# Patient Record
Sex: Female | Born: 1985 | Race: White | Hispanic: No | Marital: Married | Smoking: Never smoker
Health system: Southern US, Community
[De-identification: ages and names within clinical notes are randomized; demographics above are authoritative.]

## PROBLEM LIST (undated history)

## (undated) ENCOUNTER — Inpatient Hospital Stay (HOSPITAL_COMMUNITY): Payer: Self-pay

## (undated) DIAGNOSIS — D649 Anemia, unspecified: Secondary | ICD-10-CM

## (undated) DIAGNOSIS — L929 Granulomatous disorder of the skin and subcutaneous tissue, unspecified: Secondary | ICD-10-CM

## (undated) DIAGNOSIS — O24419 Gestational diabetes mellitus in pregnancy, unspecified control: Secondary | ICD-10-CM

## (undated) DIAGNOSIS — O09899 Supervision of other high risk pregnancies, unspecified trimester: Secondary | ICD-10-CM

## (undated) DIAGNOSIS — I10 Essential (primary) hypertension: Secondary | ICD-10-CM

## (undated) HISTORY — PX: ABDOMINAL SURGERY: SHX537

## (undated) HISTORY — DX: Gestational diabetes mellitus in pregnancy, unspecified control: O24.419

## (undated) HISTORY — DX: Supervision of other high risk pregnancies, unspecified trimester: O09.899

## (undated) HISTORY — DX: Anemia, unspecified: D64.9

## (undated) HISTORY — DX: Granulomatous disorder of the skin and subcutaneous tissue, unspecified: L92.9

## (undated) HISTORY — DX: Essential (primary) hypertension: I10

---
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
## 2005-04-26 ENCOUNTER — Ambulatory Visit (HOSPITAL_COMMUNITY): Admission: RE | Admit: 2005-04-26 | Discharge: 2005-04-26 | Payer: Self-pay | Admitting: Otolaryngology

## 2005-05-10 ENCOUNTER — Ambulatory Visit (HOSPITAL_BASED_OUTPATIENT_CLINIC_OR_DEPARTMENT_OTHER): Admission: RE | Admit: 2005-05-10 | Discharge: 2005-05-10 | Payer: Self-pay | Admitting: Otolaryngology

## 2005-05-10 ENCOUNTER — Ambulatory Visit (HOSPITAL_COMMUNITY): Admission: RE | Admit: 2005-05-10 | Discharge: 2005-05-10 | Payer: Self-pay | Admitting: Otolaryngology

## 2005-05-10 ENCOUNTER — Encounter (INDEPENDENT_AMBULATORY_CARE_PROVIDER_SITE_OTHER): Payer: Self-pay | Admitting: Specialist

## 2005-06-18 ENCOUNTER — Emergency Department (HOSPITAL_COMMUNITY): Admission: EM | Admit: 2005-06-18 | Discharge: 2005-06-18 | Payer: Self-pay | Admitting: Family Medicine

## 2005-08-07 HISTORY — PX: NASAL SINUS SURGERY: SHX719

## 2006-08-07 HISTORY — PX: TONSILLECTOMY: SUR1361

## 2007-03-15 ENCOUNTER — Emergency Department (HOSPITAL_COMMUNITY): Admission: EM | Admit: 2007-03-15 | Discharge: 2007-03-15 | Payer: Self-pay | Admitting: Emergency Medicine

## 2007-06-17 ENCOUNTER — Ambulatory Visit (HOSPITAL_COMMUNITY): Admission: RE | Admit: 2007-06-17 | Discharge: 2007-06-17 | Payer: Self-pay | Admitting: Otolaryngology

## 2007-06-17 ENCOUNTER — Encounter (INDEPENDENT_AMBULATORY_CARE_PROVIDER_SITE_OTHER): Payer: Self-pay | Admitting: Otolaryngology

## 2008-10-14 ENCOUNTER — Inpatient Hospital Stay (HOSPITAL_COMMUNITY): Admission: AD | Admit: 2008-10-14 | Discharge: 2008-10-14 | Payer: Self-pay | Admitting: Obstetrics and Gynecology

## 2008-10-15 ENCOUNTER — Inpatient Hospital Stay (HOSPITAL_COMMUNITY): Admission: AD | Admit: 2008-10-15 | Discharge: 2008-10-19 | Payer: Self-pay | Admitting: Obstetrics and Gynecology

## 2008-10-16 ENCOUNTER — Encounter (INDEPENDENT_AMBULATORY_CARE_PROVIDER_SITE_OTHER): Payer: Self-pay | Admitting: Obstetrics and Gynecology

## 2008-10-16 ENCOUNTER — Ambulatory Visit: Payer: Self-pay | Admitting: Obstetrics & Gynecology

## 2008-10-24 ENCOUNTER — Inpatient Hospital Stay (HOSPITAL_COMMUNITY): Admission: AD | Admit: 2008-10-24 | Discharge: 2008-10-25 | Payer: Self-pay | Admitting: Obstetrics and Gynecology

## 2009-08-22 ENCOUNTER — Emergency Department (HOSPITAL_COMMUNITY): Admission: EM | Admit: 2009-08-22 | Discharge: 2009-08-22 | Payer: Self-pay | Admitting: Emergency Medicine

## 2009-08-23 ENCOUNTER — Ambulatory Visit (HOSPITAL_COMMUNITY): Admission: RE | Admit: 2009-08-23 | Discharge: 2009-08-23 | Payer: Self-pay | Admitting: Emergency Medicine

## 2010-10-13 ENCOUNTER — Ambulatory Visit (INDEPENDENT_AMBULATORY_CARE_PROVIDER_SITE_OTHER): Payer: BC Managed Care – PPO | Admitting: Women's Health

## 2010-10-13 DIAGNOSIS — N898 Other specified noninflammatory disorders of vagina: Secondary | ICD-10-CM

## 2010-10-13 DIAGNOSIS — Z23 Encounter for immunization: Secondary | ICD-10-CM

## 2010-10-13 DIAGNOSIS — B373 Candidiasis of vulva and vagina: Secondary | ICD-10-CM

## 2010-10-24 LAB — CBC
HCT: 37.8 % (ref 36.0–46.0)
Hemoglobin: 12.6 g/dL (ref 12.0–15.0)
MCHC: 33.3 g/dL (ref 30.0–36.0)
MCV: 83.8 fL (ref 78.0–100.0)
Platelets: 216 10*3/uL (ref 150–400)
RBC: 4.51 MIL/uL (ref 3.87–5.11)

## 2010-10-24 LAB — COMPREHENSIVE METABOLIC PANEL
ALT: 16 U/L (ref 0–35)
AST: 16 U/L (ref 0–37)
Alkaline Phosphatase: 82 U/L (ref 39–117)
Chloride: 105 mEq/L (ref 96–112)
GFR calc Af Amer: >60 mL/min (ref >60)
GFR calc non Af Amer: >60 mL/min (ref >60)
Potassium: 4 mEq/L (ref 3.5–5.1)
Total Bilirubin: 0.4 mg/dL (ref 0.3–1.2)
Total Protein: 6.7 g/dL (ref 6.0–8.3)

## 2010-10-24 LAB — POCT PREGNANCY, URINE: Preg Test, Ur: NEGATIVE

## 2010-10-24 LAB — URINALYSIS, ROUTINE W REFLEX MICROSCOPIC
Glucose, UA: NEGATIVE mg/dL
Ketones, ur: NEGATIVE mg/dL
Protein, ur: NEGATIVE mg/dL
Specific Gravity, Urine: 1.015 (ref 1.005–1.030)
pH: 6.5 (ref 5.0–8.0)

## 2010-10-24 LAB — DIFFERENTIAL
Eosinophils Relative: 1 % (ref 0–5)
Lymphs Abs: 2.3 10*3/uL (ref 0.7–4.0)
Neutro Abs: 11.3 10*3/uL — ABNORMAL HIGH (ref 1.7–7.7)

## 2010-11-17 LAB — CBC
HCT: 23.9 % — ABNORMAL LOW (ref 36.0–46.0)
HCT: 25.6 % — ABNORMAL LOW (ref 36.0–46.0)
HCT: 33 % — ABNORMAL LOW (ref 36.0–46.0)
HCT: 33.3 % — ABNORMAL LOW (ref 36.0–46.0)
HCT: 34 % — ABNORMAL LOW (ref 36.0–46.0)
Hemoglobin: 11.5 g/dL — ABNORMAL LOW (ref 12.0–15.0)
Hemoglobin: 8.1 g/dL — ABNORMAL LOW (ref 12.0–15.0)
MCHC: 33.6 g/dL (ref 30.0–36.0)
MCHC: 33.8 g/dL (ref 30.0–36.0)
MCHC: 33.8 g/dL (ref 30.0–36.0)
MCV: 86.2 fL (ref 78.0–100.0)
MCV: 87.1 fL (ref 78.0–100.0)
Platelets: 152 10*3/uL (ref 150–400)
Platelets: 195 10*3/uL (ref 150–400)
Platelets: 226 10*3/uL (ref 150–400)
RBC: 2.95 MIL/uL — ABNORMAL LOW (ref 3.87–5.11)
RBC: 3.83 MIL/uL — ABNORMAL LOW (ref 3.87–5.11)
RBC: 3.95 MIL/uL (ref 3.87–5.11)
RDW: 15 % (ref 11.5–15.5)
RDW: 15.4 % (ref 11.5–15.5)
WBC: 12.7 10*3/uL — ABNORMAL HIGH (ref 4.0–10.5)

## 2010-11-17 LAB — COMPREHENSIVE METABOLIC PANEL
ALT: 11 U/L (ref 0–35)
ALT: 11 U/L (ref 0–35)
AST: 18 U/L (ref 0–37)
Alkaline Phosphatase: 157 U/L — ABNORMAL HIGH (ref 39–117)
Calcium: 8.6 mg/dL (ref 8.4–10.5)
Creatinine, Ser: 0.61 mg/dL (ref 0.4–1.2)
GFR calc Af Amer: >60 mL/min (ref >60)
GFR calc non Af Amer: >60 mL/min (ref >60)
Glucose, Bld: 83 mg/dL (ref 70–99)
Potassium: 4 mEq/L (ref 3.5–5.1)
Sodium: 135 mEq/L (ref 135–145)
Sodium: 136 mEq/L (ref 135–145)
Total Bilirubin: 0.2 mg/dL — ABNORMAL LOW (ref 0.3–1.2)
Total Protein: 5.7 g/dL — ABNORMAL LOW (ref 6.0–8.3)

## 2010-11-17 LAB — URINALYSIS, ROUTINE W REFLEX MICROSCOPIC
Bilirubin Urine: NEGATIVE
Hgb urine dipstick: NEGATIVE
Protein, ur: NEGATIVE mg/dL
pH: 6.5 (ref 5.0–8.0)

## 2010-11-17 LAB — URIC ACID: Uric Acid, Serum: 5.4 mg/dL (ref 2.4–7.0)

## 2010-11-17 LAB — LACTATE DEHYDROGENASE: LDH: 174 U/L (ref 94–250)

## 2010-11-17 LAB — TSH: TSH: 5.508 u[IU]/mL — ABNORMAL HIGH (ref 0.350–4.500)

## 2010-12-20 NOTE — Op Note (Signed)
NAMEDANDREA, [name]                 ACCOUNT NO.:  [id number]   MEDICAL RECORD NO.:  [id number]          PATIENT TYPE:  INP   LOCATION:  9106                          FACILITY:  [facility]   PHYSICIAN:  Lucille Passy. Ulanda Edison, M.D. DATE OF BIRTH:  1985/08/11   DATE OF PROCEDURE:  10/16/2008  DATE OF DISCHARGE:                               OPERATIVE REPORT   PREOPERATIVE DIAGNOSES:  1. Intrauterine pregnancy at 38+ weeks.  2. Pregnancy-induced high blood pressure.  3. Cephalopelvic disproportion.   POSTOPERATIVE DIAGNOSES:  1. Intrauterine pregnancy at 38+ weeks.  2. Pregnancy-induced high blood pressure.  3. Cephalopelvic disproportion.   OPERATION:  Low transverse cervical cesarean section.   SURGEON:  Lucille Passy. Ulanda Edison, M.D.   ASSISTANT:  Emily Filbert, MD   ANESTHESIA:  Epidural anesthesia.   HISTORY OF PRESENT ILLNESS:  The patient was brought to the operating  room after pushing for over 2 hours and not bringing the vertex to  within 2 inches of the introitus.  She was tired of pushing and said she  wanted to proceed with a C-section, even though I encouraged her to push  for full 3 hours, she did not want to.  The epidural anesthetic was  boosted.  She was placed on the operating table, placed in left lateral  tilt position.  Fetal heart tones were heard at 150.  Before the  abdominal wall was taped up to an ether bar, I made a mark with marking  pen to place the incision exactly where I wanted it.  Then the abdomen  was taped up.  The abdomen was prepped with Betadine solution and draped  as a sterile field.  Anesthesia was confirmed and a transverse incision  was made and carried in layers through the skin and subcutaneous tissue  and fascia.  Bleeder on each side was divided and cauterized.  The  fascia was incised transversely, separated from the rectus muscles  superiorly and inferiorly.  The rectus muscle was split in the midline.  The peritoneum was opened vertically.  The  lower uterine segment was  exposed.  I made an incision through the superficial portion of the  myometrium went to the rest of way with my finger, enlarged the incision  by pulling superiorly and inferiorly and then reached my hand down into  the pelvis and the head was fairly well lodged into the pelvis.  I asked  the nurse to push up on the head, Dr. Hulan Fray felt that she could slip her  smaller hand down beneath the baby's head and lift it up.  She was able  to lift the head up.  She felt like the baby was quite asynclitic in  position, but after she got the head up into the operative field, we  delivered the head without any difficulty, suctioned the baby's nose and  mouth, delivered the rest of the baby and gave the infant to Dr.  Karmen Stabs who was in attendance.  She assigned 7 pounds 10 ounces female  infant, Apgars of 9 at 1 and 9 at 5 minutes.  The placenta was removed  intact.  The cervix was already fully dilated, so no dilatation was  necessary for the cervix.  We cleansed the inside of the uterus out,  inspected the uterus, tubes and ovaries, found them to be normal and  realized that she had very marked varicosities on the anterior  endometrial wall.  I closed the uterus in 2 layers using running lock  suture of 0 Vicryl on the first layer, nonlocking suture of the same  material on the second layer.  One extra figure-of-eight suture was  required for hemostasis in the midline.  The gutters were blotted free  of blood.  Liberal irrigation confirmed hemostasis.  Retractors were  removed and then the abdominal wall was closed with interrupted sutures  of 0 Vicryl including the rectus muscle and peritoneum.  Two running  sutures of 0 Vicryl on the fascia, running 3-0 Vicryl on the subcu  tissue and staples on the skin.  The patient seemed to tolerate  procedure well.  There was hematuria before the case began, but by the  end of the case it had essentially cleared.  The patient  seemed to  tolerate the procedure well and was returned to recovery in satisfactory  condition.  Estimated blood loss about 1000 mL.      Lucille Passy. Ulanda Edison, M.D.  Electronically Signed     TFH/MEDQ  D:  10/16/2008  T:  10/17/2008  Job:  [id number]   cc:   Emily Filbert, MD

## 2010-12-20 NOTE — H&P (Signed)
NAMEPhoebe Peterson NO.:  [id number]   MEDICAL RECORD NO.:  [id number]           PATIENT TYPE:   LOCATION:                                 FACILITY:   PHYSICIAN:  Minna Merritts, M.D.        DATE OF BIRTH:   DATE OF ADMISSION:  DATE OF DISCHARGE:                              HISTORY & PHYSICAL   This patient is a 25 year old female who has had tonsillitis x5 since  last March 2008.  She is working the Art gallery manager.  She has worked  in the [employer] here at [employer], and also is working at [employer], and is exposed to sick people on a regular basis.  She has been  on Omnicef, Augmentin.  She has used multiple antibiotics for this; her  last was Augmentin, and she has an apparently slow result at that point.  She now enters for a tonsillectomy.  She has a white count that is  slightly elevated at 14.  Her hemoglobin is 12.4.  Her further history  is that of having had sinus surgery, ethmoid and maxillary, under my  care on May 10, 2005.  She has no allergies to medications, except  she does get nauseated with VICODIN.   PHYSICAL EXAMINATION:  Blood pressure is 140/90, the pulse was 90.  EARS:  Clear.  Tympanic membranes are clear.  NOSE:  Clear.  TONSILS:  Show exudate, even on the antibiotic.  NECK:  Free of any thyromegaly, cervical adenopathy or mass.  No  salivary gland abnormalities.  LIPS, TEETH AND GUMS:  Unremarkable.  POSTERIOR TRIANGLES, AND SCALP AND SKIN:  Clear.  CHEST:  Clear.  No rales, rhonchi or wheezes.  CARDIOVASCULAR:  No murmurs, rubs, or gallops.   She is overweight at 305 pounds, but denies sleep apnea; does not snore.  She now enters for a tonsillectomy.           ______________________________  Minna Merritts, M.D.     JC/MEDQ  D:  06/17/2007  T:  06/17/2007  Job:  [id number]

## 2010-12-20 NOTE — H&P (Signed)
[name], [name]                 ACCOUNT NO.:  [id number]   MEDICAL RECORD NO.:  [id number]          PATIENT TYPE:  INP   LOCATION:  9106                          FACILITY:  [facility]   PHYSICIAN:  Lucille Passy. Ulanda Edison, M.D. DATE OF BIRTH:  December 01, 1985   DATE OF ADMISSION:  10/15/2008  DATE OF DISCHARGE:                              HISTORY & PHYSICAL   This is a 25 year old white female para 0, gravida 1, at 38 weeks and 4  days gestation by an 8-week ultrasound with [facility] October 25, 2008,  presented to the office on the day of admission, complaining of a  headache, right upper quadrant pain.  Blood pressure in our office was  160/110 with 1+ proteinuria.  She was seen on the day prior to admission  for routine visit.  Her blood pressure was 160/110 without proteinuria.  She was admitted to the Maternity Admission Unit.  She had normal labs.  The blood pressure was improved, but did not become normal.  Nonstress  test was reactive.  She was sent to MAU again on the day of admission.  Labs were still normal.  There was no protein on urinalysis.  There was  a reactive nonstress test.  Blood pressures remained 130-150/90-110.  Dr. Willis Modena felt that she should be admitted and placed on cervical  ripening.  Her RPR was nonreactive, rubella immune, hepatitis B surface  antigen negative, HIV negative, GC and Chlamydia negative, O positive  with a negative antibody, cystic fibrosis negative, 1-hour Glucola 122,  and group B strep positive.  Prenatal care was complicated by occasional  increased blood pressures, was even 148/98 at 21 weeks.  She also had  obesity.  She had a tongue lesion with intermittent bleeding.   PAST MEDICAL HISTORY:  Negative.   SURGICAL HISTORY:  She did have tonsillectomy and sinus surgery.   ALLERGIES:  VICODIN cause nausea and vomiting.   MEDICATIONS:  She was on no medications.   On admission, her blood pressure was 120-140/80-100, her weight is 328  pounds, pulse was  in the 110s.  Heart was regular rhythm with a  tachycardia.  Lungs were clear to auscultation.  Abdomen was soft and  gravid.  Estimated fetal weight by Dr. Willis Modena about 8 pounds.  Fetal  heart tones were normal.  Cervix was 1-2 cm, 30%, vertex at -2.   We should admit her for cervical wiping.  She was placed Cytotec.  By  7:50 a.m., on October 16, 2008, she had received 3 doses of Cytotec and 2  doses of Stadol.  When the fourth dose of Cytotec was due, the patient  was contracting too often to get it.  Her blood pressure was 152/94.  Cervix was 2-3 cm, 50%, and vertex at -3 to -4.  Pitocin was begun.  Penicillin was begun.  By 12:45 p.m., Pitocin was at 8 milliunits a  minute, contractions every 2-3 minutes, blood pressures 142/91.  Cervix  was 3-4 cm, 70%, and vertex at -2.  Membranes were ruptured during my  exam every 2-3 minutes.  Cervix was 4  cm, 80%, and vertex -2.  They  worsened earlier variable decelerations.  By 6:03 p.m., the cervix was  9+ cm, 100%, and  vertex at 0 station.  Pitocin was at 10 milliunits a  minute.  She became fully dilated.  At 7:20 p.m., the cervix was 10 cm.  At 8:13 p.m., the vertex was 0 to +1 station.  By 8:40 p.m., presenting  part was possibly slightly lower, but still well above the introitus.  Pitocin was at 12 milliunits.  At 9:27 p.m., the patient had pushed with  least 2 hours.  There had been no significant descent of the vertex, it  was still at least 2 inches above the introitus.  The patient did not  want to continue to push and since she has made no significant  progress.  I felt it was indicated to proceed with C-section.  I called  the operating room.  I do not have a room available, they will call a  second team.  The patient is prepared for C-section for relative  cephalopelvic disproportion.       Lucille Passy. Ulanda Edison, M.D.  Electronically Signed     TFH/MEDQ  D:  10/16/2008  T:  10/17/2008  Job:  [id number]

## 2010-12-20 NOTE — Discharge Summary (Signed)
NAMEKELISA, MESMAN                 ACCOUNT NO.:  [id number]   MEDICAL RECORD NO.:  [id number]          PATIENT TYPE:  INP   LOCATION:  9106                          FACILITY:  [facility]   PHYSICIAN:  Lucille Passy. Ulanda Edison, M.D. DATE OF BIRTH:  16-Dec-1985   DATE OF ADMISSION:  10/15/2008  DATE OF DISCHARGE:  10/19/2008                               DISCHARGE SUMMARY   This patient's prenatal record and course in labor are dictated in the  history and physical.  Essentially, she was admitted for elevated blood  pressure and begun on induction with Cytotec subsequent with Pitocin.  She reached full dilatation, pushed well.  We could not bring the baby  below 0 station.  She was taken to the operating room and underwent a  low transverse cervical C-section by Dr. Ulanda Edison with Dr. Hulan Fray assisting  under epidural block.  The patient's pulse was elevated on admission, it  remained intermittently elevated, but TSH was actually a little high  rather than low.  Hemoglobin started 11.5, hematocrit 34.0, white count  12,700, platelet count 230,000.  Postpartum hemoglobins were 8.6 at 8.1.  Preeclampsia profile was normal.  RPR nonreactive.  After the C-section,  the patient did well.  She ambulated well without difficulty.  On the  evening of the first postop day, she did have a temperature elevation to  100.3 degrees and became febrile again and on the third postop day her  staples removed.  Strips were applied.  The incision was healing well,  and she was ready for discharge.   FINAL DIAGNOSES:  Intrauterine pregnancy 38+ weeks with pregnancy-  induced high blood pressure, cephalopelvic disproportion.   OPERATION:  Low transverse cervical cesarean section.   FINAL CONDITION:  Improved.  Instructions include our regular discharge  instruction booklet.  The patient is advised to return to the office in  10 days for followup examination.  The only medication she wants is  Motrin 600 mg 36 tablets one every  6 hours as needed for pain, and she  is to continue iron and prenatal vitamins.  I have advised her to repeat  her TSH in approximately 6 weeks.  The TSH was 5.508.      Lucille Passy. Ulanda Edison, M.D.  Electronically Signed     TFH/MEDQ  D:  10/19/2008  T:  10/19/2008  Job:  [id number]

## 2010-12-20 NOTE — Op Note (Signed)
[name], [name]               ACCOUNT NO.:  [id number]   MEDICAL RECORD NO.:  [id number]          PATIENT TYPE:  AMB   LOCATION:  DFTL                         FACILITY:  [facility]   PHYSICIAN:  Minna Merritts, M.D.   DATE OF BIRTH:  [birth year]   DATE OF PROCEDURE:  DATE OF DISCHARGE:                               OPERATIVE REPORT   PREOPERATIVE DIAGNOSIS:  Tonsillitis.   POSTOPERATIVE DIAGNOSIS:  Tonsillitis.   OPERATION:  Tonsillectomy.   ANESTHESIA:  General endotracheal.   ANESTHESIOLOGIST:  Lorrene Reid, M.D.   OPERATOR:  Minna Merritts, M.D.   CONSENT:  The patient is aware of the risks and gains, that she has to  watch her diet, be a very bland and soft diet with avoidance of any  harsh acidic, vinegar or salty foods and avoidance of hamburgers, [place]  fries, pizza, anything that will scratch her throat.  She also cannot  travel for any distance or out of the way for approximately 2 weeks.   PROCEDURE:  The patient was placed in the supine position and under  general endotracheal anesthesia the tonsils were removed using blunt and  Bovie electrocoagulation dissection.  They were exudative.  There was a  considerable amount of purulent material, and she has been on  antibiotics once again IV as well as Decadron.  Once this was completed,  the stomach was suctioned and the tonsillar beds were clear of any  bleeding.  Her blood loss was approximately 5 mL.  The patient tolerated  the procedure well and is doing well postoperatively.  She will be  followed up in 5 days and then in 10 days and then 3 weeks and then 6  weeks.           ______________________________  Minna Merritts, M.D.     JC/MEDQ  D:  06/17/2007  T:  06/17/2007  Job:  [id number]   cc:   [place]

## 2010-12-22 ENCOUNTER — Ambulatory Visit (INDEPENDENT_AMBULATORY_CARE_PROVIDER_SITE_OTHER): Payer: BC Managed Care – PPO | Admitting: Gynecology

## 2010-12-22 DIAGNOSIS — L02818 Cutaneous abscess of other sites: Secondary | ICD-10-CM

## 2010-12-23 NOTE — Op Note (Signed)
[name], [name]               ACCOUNT NO.:  [id number]   MEDICAL RECORD NO.:  [id number]          PATIENT TYPE:  AMB   LOCATION:  [place]                          FACILITY:  [facility]   PHYSICIAN:  Minna Merritts, M.D.   DATE OF BIRTH:  04-29-1986   DATE OF PROCEDURE:  05/10/2005  DATE OF DISCHARGE:                                 OPERATIVE REPORT   PREOPERATIVE DIAGNOSIS:  Bilateral ethmoid and maxillary sinusitis.   POSTOPERATIVE DIAGNOSIS:  Bilateral ethmoid and maxillary sinusitis.   OPERATION:  Functional endoscopic sinus surgery with bilateral ethmoidectomy  and bilateral maxillary sinus ostial enlargement, left antrostomy.   OPERATOR:  Minna Merritts, M.D.   ANESTHESIA:  Local MAC, 1% Xylocaine with epinephrine, 6 mL, and topical  cocaine 200 mg.   ANESTHESIOLOGIST:  Sharolyn Douglas, M.D.   PROCEDURE:  The patient was placed in a supine position and under local  anesthesia, was prepped and draped in the usual manner using Hibiclens x3  and the usual head drape.  Once we anesthetized the patient, the inferior  turbinates were just out-fractured to gain some more space.  The middle  turbinates were infractured to gain more space and the left ethmoid sinus  was approached and considerable polypoid debris was removed from this  ethmoid sinus using the 0-degree scope and 30-degree scope and the upbiting  and straightforward Blakesley-Wilder forceps.  Once this was cleared, we  looked into the sinus very well and could see the mucous membrane that was  left behind was quite normal in status more posteriorly and the remainder we  left behind was felt to be reversible back to more normal status.  The  maxillary sinus was then approached and we could not find the natural  ostium, essentially was a pinhole, except by palpation, using a curved  suction, and once we did find this, we suctioned a considerable amount of  mucopurulent debris from this left maxillary sinus and the natural  ostium  was then opened approximately 5 times its normal size so it would adequately  drain.  The antrostomy was also carried out using a curved the angled  Blakesley-Wilder to suction the inferior aspect of the sinus and make sure  it drained effectively.  The mucous membrane was found to be somewhat  thickened, but again, we did not strip this membrane, as we felt now that it  was draining well, it will revert back to its more normal status  The right  side was similar and using a 0-degree scope, a straight Blakesley-Wilder and  upbiting Blakesley-Wilders and a 30-degree scope, the ethmoid was opened,  polyps were removed, thickened membrane, some removed that was irreversible,  some was left behind, and then we could see into the ethmoid sinus and found  this to be otherwise unremarkable.  The frontal region on this side and the  opposite was found to be in quite decent condition.  The maxillary sinus was  again very difficult to see the natural ostium and once this was found, this  was increased to approximately 5 times its normal size.  The  mucous membrane  again was thickened to a point where we had to remove a considerable amount  membrane around the ostium, but inside the sinus, we felt this would revert  back to its more normal status  With this, we then placed Gelfoam within the  ethmoid sinus, Gelfilm to hold the middle turbinate over, Telfa within the  nose.  The patient tolerated the procedure very well and is doing well  postoperatively.   Her followup will also be tomorrow to remove the packing and then in 1 week,  3 weeks and 6 weeks, 3 months, 6 months and a year.           ______________________________  Minna Merritts, M.D.     JC/MEDQ  D:  05/10/2005  T:  05/10/2005  Job:  [id number]   cc:   [place]

## 2010-12-23 NOTE — H&P (Signed)
NAMEBISMA, HAINS NO.:  [id number]   MEDICAL RECORD NO.:  [id number]          PATIENT TYPE:  AMB   LOCATION:  [place]                          FACILITY:  [facility]   PHYSICIAN:  Minna Merritts, M.D.   DATE OF BIRTH:  [birth year]   DATE OF ADMISSION:  05/10/2005  DATE OF DISCHARGE:                                HISTORY & PHYSICAL   This patient is a 25 year old female who has had sinusitis and tonsillitis  for approximately four or five years.  She has worked at [employer]  [employer] and is dealing with sick people, people who have pneumonia, etc.,  and has been exposed to considerable number of infections.  She has been  treated on antibiotics on several occasions, more recently been using  Omnicef and Maxifed G as a decongestant and also has been on Augmentin on  several occasions.  She has really not a great deal of drainage but when she  does have drainage it aggravates her tonsils and her tonsils get infected  and she feels that the infection tends to be going back and forth.  She had  a CAT scan of her sinuses after we fully treated her which showed bilateral  ethmoid and maxillary sinusitis with total opacification of the left  maxillary sinus and almost total opacification of both ethmoids and a lot of  thickening in the right.  There was also thickening in the right frontal  sinus and the sphenoid looked quite good.  Her turbinates were quite large.  She had a very mild septal deviation.  Her past history is one of allergy to  [facility] and she has never had any surgery before and the Vicodin allergy  causes her nausea and vomiting.  She does not smoke or drink.  She has no  pulmonary, bowel, bladder, or kidney problems.   PHYSICAL EXAMINATION:  VITAL SIGNS:  Blood pressure 124/89, pulse 77, weight  220, height 5 feet 9 inches.  HEENT:  Ears are clear.  The tympanic membranes are clear.  The nose shows  some turbinate hypertrophy with mild erythema  but there is no purulent  drainage at this time.  She is on antibiotic.  Her throat is quite clear  now.  Her tonsils are notable, very mildly erythematous and she still has an  elevated white count of 15.4.  She will be protected with antibiotics and  decongestants postoperatively and will also be given a Decadron IV.  Her  neck is clear.  No cervical adenopathy or mass.  Her oral cavity is clear  except for the mild tonsillar inflammation and her larynx is clear.  True  cords, false cords, epiglottis, base of tongue are clear.  CHEST:  Clear.  No rales, rhonchi, or wheezes.  CARDIOVASCULAR:  No ___________, rubs, murmurs, or gallops.   INITIAL DIAGNOSES:  Sinusitis with tonsillitis.  Sinusitis will be ethmoid  and maxillary sinuses bilaterally.           ______________________________  Minna Merritts, M.D.     JC/MEDQ  D:  05/10/2005  T:  05/10/2005  Job:  [id number]   cc:   [place]

## 2011-01-26 ENCOUNTER — Ambulatory Visit (INDEPENDENT_AMBULATORY_CARE_PROVIDER_SITE_OTHER): Payer: BC Managed Care – PPO

## 2011-01-26 DIAGNOSIS — Z23 Encounter for immunization: Secondary | ICD-10-CM

## 2011-02-06 ENCOUNTER — Emergency Department (HOSPITAL_COMMUNITY)
Admission: EM | Admit: 2011-02-06 | Discharge: 2011-02-06 | Disposition: A | Discharge status: Home / Self Care | Payer: BC Managed Care – PPO | Attending: Emergency Medicine | Admitting: Emergency Medicine

## 2011-02-06 DIAGNOSIS — S301XXA Contusion of abdominal wall, initial encounter: Secondary | ICD-10-CM | POA: Insufficient documentation

## 2011-02-06 DIAGNOSIS — R509 Fever, unspecified: Secondary | ICD-10-CM | POA: Insufficient documentation

## 2011-02-06 DIAGNOSIS — Z9889 Other specified postprocedural states: Secondary | ICD-10-CM | POA: Insufficient documentation

## 2011-03-23 ENCOUNTER — Emergency Department (HOSPITAL_COMMUNITY)
Admission: EM | Admit: 2011-03-23 | Discharge: 2011-03-23 | Disposition: A | Discharge status: Home / Self Care | Payer: BC Managed Care – PPO | Attending: Emergency Medicine | Admitting: Emergency Medicine

## 2011-03-23 DIAGNOSIS — R110 Nausea: Secondary | ICD-10-CM | POA: Insufficient documentation

## 2011-03-23 DIAGNOSIS — L02219 Cutaneous abscess of trunk, unspecified: Secondary | ICD-10-CM | POA: Insufficient documentation

## 2011-03-23 DIAGNOSIS — R509 Fever, unspecified: Secondary | ICD-10-CM | POA: Insufficient documentation

## 2011-03-23 DIAGNOSIS — Z9889 Other specified postprocedural states: Secondary | ICD-10-CM | POA: Insufficient documentation

## 2011-03-23 LAB — POCT I-STAT, CHEM 8
BUN: 11 mg/dL (ref 6–23)
Calcium, Ion: 1.14 mmol/L (ref 1.12–1.32)
Chloride: 105 mEq/L (ref 96–112)
Glucose, Bld: 76 mg/dL (ref 70–99)
HCT: 39 % (ref 36.0–46.0)
Potassium: 3.8 mEq/L (ref 3.5–5.1)

## 2011-03-23 LAB — URINALYSIS, ROUTINE W REFLEX MICROSCOPIC
Bilirubin Urine: NEGATIVE
Ketones, ur: NEGATIVE mg/dL
Protein, ur: NEGATIVE mg/dL
Urobilinogen, UA: 0.2 mg/dL (ref 0.0–1.0)

## 2011-03-23 LAB — CBC
HCT: 36.8 % (ref 36.0–46.0)
MCV: 84.2 fL (ref 78.0–100.0)
RBC: 4.37 MIL/uL (ref 3.87–5.11)
WBC: 10.8 10*3/uL — ABNORMAL HIGH (ref 4.0–10.5)

## 2011-03-23 LAB — URINE MICROSCOPIC-ADD ON

## 2011-03-23 LAB — DIFFERENTIAL
Eosinophils Relative: 3 % (ref 0–5)
Lymphocytes Relative: 26 % (ref 12–46)
Lymphs Abs: 2.8 10*3/uL (ref 0.7–4.0)
Monocytes Absolute: 1 10*3/uL (ref 0.1–1.0)
Monocytes Relative: 9 % (ref 3–12)
Neutro Abs: 6.7 10*3/uL (ref 1.7–7.7)

## 2011-03-25 ENCOUNTER — Emergency Department (HOSPITAL_COMMUNITY)
Admission: EM | Admit: 2011-03-25 | Discharge: 2011-03-25 | Disposition: A | Discharge status: Home / Self Care | Payer: BC Managed Care – PPO | Attending: Emergency Medicine | Admitting: Emergency Medicine

## 2011-03-25 DIAGNOSIS — Z09 Encounter for follow-up examination after completed treatment for conditions other than malignant neoplasm: Secondary | ICD-10-CM | POA: Insufficient documentation

## 2011-03-25 DIAGNOSIS — L02219 Cutaneous abscess of trunk, unspecified: Secondary | ICD-10-CM | POA: Insufficient documentation

## 2011-03-25 DIAGNOSIS — L03319 Cellulitis of trunk, unspecified: Secondary | ICD-10-CM | POA: Insufficient documentation

## 2011-03-26 LAB — WOUND CULTURE: Culture: NO GROWTH

## 2011-04-16 ENCOUNTER — Emergency Department (HOSPITAL_COMMUNITY)
Admission: EM | Admit: 2011-04-16 | Discharge: 2011-04-16 | Disposition: A | Discharge status: Home / Self Care | Payer: BC Managed Care – PPO | Attending: Emergency Medicine | Admitting: Emergency Medicine

## 2011-04-16 DIAGNOSIS — L02219 Cutaneous abscess of trunk, unspecified: Secondary | ICD-10-CM | POA: Insufficient documentation

## 2011-04-16 DIAGNOSIS — N898 Other specified noninflammatory disorders of vagina: Secondary | ICD-10-CM | POA: Insufficient documentation

## 2011-04-16 DIAGNOSIS — R109 Unspecified abdominal pain: Secondary | ICD-10-CM | POA: Insufficient documentation

## 2011-04-16 DIAGNOSIS — Z9889 Other specified postprocedural states: Secondary | ICD-10-CM | POA: Insufficient documentation

## 2011-04-16 DIAGNOSIS — N809 Endometriosis, unspecified: Secondary | ICD-10-CM | POA: Insufficient documentation

## 2011-04-16 DIAGNOSIS — L03319 Cellulitis of trunk, unspecified: Secondary | ICD-10-CM | POA: Insufficient documentation

## 2011-04-16 LAB — CBC
MCHC: 32.6 g/dL (ref 30.0–36.0)
Platelets: 255 10*3/uL (ref 150–400)
RDW: 13.7 % (ref 11.5–15.5)
WBC: 9.5 10*3/uL (ref 4.0–10.5)

## 2011-04-16 LAB — BASIC METABOLIC PANEL
BUN: 10 mg/dL (ref 6–23)
Creatinine, Ser: 0.74 mg/dL (ref 0.50–1.10)
GFR calc Af Amer: >60 mL/min (ref >60)
GFR calc non Af Amer: >60 mL/min (ref >60)
Glucose, Bld: 80 mg/dL (ref 70–99)

## 2011-04-16 LAB — URINE MICROSCOPIC-ADD ON

## 2011-04-16 LAB — URINALYSIS, ROUTINE W REFLEX MICROSCOPIC
Bilirubin Urine: NEGATIVE
Glucose, UA: NEGATIVE mg/dL
Ketones, ur: NEGATIVE mg/dL
Leukocytes, UA: NEGATIVE
Nitrite: NEGATIVE
Specific Gravity, Urine: 1.023 (ref 1.005–1.030)
pH: 6 (ref 5.0–8.0)

## 2011-04-16 LAB — DIFFERENTIAL
Basophils Absolute: 0 10*3/uL (ref 0.0–0.1)
Basophils Relative: 0 % (ref 0–1)
Eosinophils Absolute: 0.3 10*3/uL (ref 0.0–0.7)
Eosinophils Relative: 3 % (ref 0–5)
Monocytes Absolute: 0.9 10*3/uL (ref 0.1–1.0)

## 2011-04-16 LAB — APTT: aPTT: 29 seconds (ref 24–37)

## 2011-04-16 LAB — WET PREP, GENITAL
Clue Cells Wet Prep HPF POC: NONE SEEN
Trich, Wet Prep: NONE SEEN

## 2011-04-16 LAB — POCT PREGNANCY, URINE: Preg Test, Ur: NEGATIVE

## 2011-04-20 ENCOUNTER — Encounter: Payer: Self-pay | Admitting: Gynecology

## 2011-04-20 ENCOUNTER — Ambulatory Visit (INDEPENDENT_AMBULATORY_CARE_PROVIDER_SITE_OTHER): Payer: BC Managed Care – PPO | Admitting: Gynecology

## 2011-04-20 VITALS — BP 126/86

## 2011-04-20 DIAGNOSIS — L980 Pyogenic granuloma: Secondary | ICD-10-CM

## 2011-04-20 DIAGNOSIS — L089 Local infection of the skin and subcutaneous tissue, unspecified: Secondary | ICD-10-CM

## 2011-04-20 DIAGNOSIS — L929 Granulomatous disorder of the skin and subcutaneous tissue, unspecified: Secondary | ICD-10-CM

## 2011-04-20 DIAGNOSIS — T148XXA Other injury of unspecified body region, initial encounter: Secondary | ICD-10-CM

## 2011-04-20 DIAGNOSIS — W458XXA Other foreign body or object entering through skin, initial encounter: Secondary | ICD-10-CM

## 2011-04-20 NOTE — Progress Notes (Signed)
Patient is a 25 year old gravida 1 para 1 who had a C-section approximately 2 years ago. Postoperatively the patient had problems with wound healing and incisional separation requiring wound debridement and antibiotic therapy for over a month. She has been suffering from this tender midportion of her Pfannenstiel incision which has not gotten better despite having had it drained on several occasions.Most recently she was seen in the emergency room and had been placed on doxycycline for 10 day period. She had been referred to a plastic surgeon at [facility] (Dr. Eugene Garnet) who had recommended incisional revision as a result of this indurated 4 cm area which appears to be tissue/suture granuloma. Patient was denied by her insurance company to have such procedure undertaken.  Patient was examined today in the office. The midportion of her Pfannenstiel incision demonstrated a 4 cm raised tender indurated and ecchymotic area with small pinpoint drainage area from where in the emergency room several weeks ago they attempted to drain. On bimanual examination her uterus and adnexa were palpated be normal and nontender.  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 I have concerns whether she may have developed with time and enterocutaneous fistula. This is a necessary operation that she needs and is not for cosmetic reasons. My concerns are that she could potentially develop a worsening scenario such as necrotizing fasciitis. I've asked her to continue her doxycycline as previously prescribed and to apply Neosporin to the area to times a day and to wash the area with antibacterial soap once a day.

## 2011-05-15 ENCOUNTER — Encounter: Payer: Self-pay | Admitting: Gynecology

## 2011-05-15 ENCOUNTER — Telehealth: Payer: Self-pay | Admitting: Gynecology

## 2011-05-15 NOTE — Progress Notes (Signed)
  See telephone call 05/15/2011 for information

## 2011-05-15 NOTE — Telephone Encounter (Addendum)
Mother presented in the office today stating her daughter needed to be seen for a 2 year post op cesarean section wound dehiscence  that was not healing properly.  Patient has been seen several times in ER for this issue.   Dr.Fernandez had seen this patient in September and was told that she had seen a plastic surgeon that had recommended repair however her insurance would not cover this procedure surgeon for follow up on this .  Dr.Fernandez could only offer a letter of reconsideration to the insurance company on this request.  At this time Dr.Fernandez had me inform this mother that the patient she would need to go back to the surgeons that performed the C-Section or go back to the ER as they have been following her for this issue.  Patients  Mother was advised. ph

## 2011-05-16 LAB — CBC
HCT: 37.5
Hemoglobin: 12.4
MCHC: 33.2
MCV: 79
Platelets: 285
RBC: 4.75
RDW: 14.5 — ABNORMAL HIGH
WBC: 14 — ABNORMAL HIGH

## 2011-05-16 LAB — URINALYSIS, ROUTINE W REFLEX MICROSCOPIC
Bilirubin Urine: NEGATIVE
Glucose, UA: NEGATIVE
Hgb urine dipstick: NEGATIVE
Ketones, ur: NEGATIVE
Nitrite: NEGATIVE
Protein, ur: NEGATIVE
Specific Gravity, Urine: 1.023
Urobilinogen, UA: 1
pH: 6

## 2011-05-30 ENCOUNTER — Ambulatory Visit (INDEPENDENT_AMBULATORY_CARE_PROVIDER_SITE_OTHER): Payer: BC Managed Care – PPO | Admitting: Anesthesiology

## 2011-05-30 DIAGNOSIS — Z23 Encounter for immunization: Secondary | ICD-10-CM

## 2011-07-01 ENCOUNTER — Emergency Department (HOSPITAL_COMMUNITY)
Admission: EM | Admit: 2011-07-01 | Discharge: 2011-07-01 | Disposition: A | Discharge status: Home / Self Care | Payer: BC Managed Care – PPO | Attending: Emergency Medicine | Admitting: Emergency Medicine

## 2011-07-01 ENCOUNTER — Encounter (HOSPITAL_COMMUNITY): Payer: Self-pay | Admitting: *Deleted

## 2011-07-01 DIAGNOSIS — IMO0002 Reserved for concepts with insufficient information to code with codable children: Secondary | ICD-10-CM | POA: Insufficient documentation

## 2011-07-01 DIAGNOSIS — X58XXXA Exposure to other specified factors, initial encounter: Secondary | ICD-10-CM | POA: Insufficient documentation

## 2011-07-01 DIAGNOSIS — T148XXA Other injury of unspecified body region, initial encounter: Secondary | ICD-10-CM

## 2011-07-01 DIAGNOSIS — S8010XA Contusion of unspecified lower leg, initial encounter: Secondary | ICD-10-CM | POA: Insufficient documentation

## 2011-07-01 DIAGNOSIS — Y92009 Unspecified place in unspecified non-institutional (private) residence as the place of occurrence of the external cause: Secondary | ICD-10-CM | POA: Insufficient documentation

## 2011-07-01 MED ORDER — OXYCODONE-ACETAMINOPHEN 5-325 MG PO TABS: 2.0000 | ORAL_TABLET | ORAL | Status: AC | PRN

## 2011-07-01 MED ORDER — OXYCODONE-ACETAMINOPHEN 5-325 MG PO TABS
1.0000 | ORAL_TABLET | Freq: Once | ORAL | Status: AC
  Administered 2011-07-01: 1 via ORAL
  Filled 2011-07-01: qty 1

## 2011-07-01 NOTE — ED Notes (Signed)
Pt a/ox4. Resp even and unlabored. NAD at this time. D/C instructions reviewed with pt along with Rx. Pt verbalized understanding. Pt ambulated to lobby with steady gate.

## 2011-07-01 NOTE — ED Notes (Signed)
Pt states her foot went through a floor board in the house and she injured her right leg.

## 2011-07-02 NOTE — ED Provider Notes (Signed)
History     CSN: [id number] Arrival date & time: 07/01/2011  5:23 PM   First MD Initiated Contact with Patient 07/01/11 1731      Chief Complaint  Patient presents with  . Leg Pain    (Consider location/radiation/quality/duration/timing/severity/associated sxs/prior treatment) HPI Comments: KYLIEANN DEGIROLAMO is a 25 y.o. female who presents to the Emergency Department complaining of pain, swelling and scrape to right lower leg after stepping through a floor board in her home two hours ago.Pain increases with walking and with palpation. She has taken no medicines for relief.  Patient is a 25 y.o. female presenting with leg pain.  Leg Pain     Past Medical History  Diagnosis Date  . Granuloma, skin     incisional pfannenstiel scar    Past Surgical History  Procedure Date  . Cesarean section   . Nasal sinus surgery 2007  . Tonsillectomy 2008    Family History  Problem Relation Age of Onset  . Diabetes Mother   . Hypertension Mother   . Heart disease Father     History  Substance Use Topics  . Smoking status: Never Smoker   . Smokeless tobacco: Never Used  . Alcohol Use: No    OB History    Grav Para Term Preterm Abortions TAB SAB Ect Mult Living                  Review of Systems 10 Systems reviewed and are negative for acute change except as noted in the HPI. Allergies  Phenergan and Vicodin  Home Medications   Current Outpatient Rx  Name Route Sig Dispense Refill  . DOXYCYCLINE HYCLATE 100 MG PO CPEP Oral Take 100 mg by mouth 2 (two) times daily.      Manson Passey TRI-CYCLEN (28) PO Oral Take by mouth daily.      . OXYCODONE-ACETAMINOPHEN 5-325 MG PO TABS Oral Take 2 tablets by mouth every 4 (four) hours as needed for pain. 15 tablet 0  . OXYCODONE-ACETAMINOPHEN 5-325 MG PO TABS Oral Take 2 tablets by mouth every 4 (four) hours as needed for pain. 15 tablet 0  . PRENATAL VITAMIN PO Oral Take by mouth.        BP 143/84  Pulse 104  Temp(Src) 98.2 F (36.8  C) (Oral)  Resp 18  Ht 5\' 9"  (1.753 m)  Wt 294 lb (133.358 kg)  BMI 43.42 kg/m2  SpO2 100%  LMP 06/25/2011  Physical Exam  Nursing note and vitals reviewed. Constitutional: She appears well-developed and well-nourished.  HENT:  Head: Normocephalic and atraumatic.  Eyes: EOM are normal.  Neck: Normal range of motion.  Cardiovascular: Normal rate, normal heart sounds and intact distal pulses.   Pulmonary/Chest: Breath sounds normal.  Musculoskeletal:       8 cm x 6 cm contusion to lateral right calf with overlying 5 cm abrasion. Tenderness with minimal palpation. No bleeding.  Neurological:       Gait normal  Skin: Skin is dry.    ED Course  Procedures (including critical care time)    1. Contusion   2. Abrasion       MDM  Patient who stepped though a floor board at ome sustaining a contusion and abrasion to her right lower leg. Gait normal. Pt stable in ED with no significant deterioration in condition.The patient appears reasonably screened and/or stabilized for discharge and I doubt any other medical condition or other [facility] requiring further screening, evaluation, or treatment in the  ED at this time prior to discharge.  MDM Reviewed: nursing note and vitals           Gypsy Balsam. Olin Hauser, MD 07/02/11 1027

## 2011-07-07 HISTORY — PX: ENDOMETRIAL FULGURATION: SHX1500

## 2011-08-04 ENCOUNTER — Emergency Department (HOSPITAL_COMMUNITY)
Admission: EM | Admit: 2011-08-04 | Discharge: 2011-08-04 | Disposition: A | Discharge status: Home / Self Care | Payer: BC Managed Care – PPO | Attending: Emergency Medicine | Admitting: Emergency Medicine

## 2011-08-04 ENCOUNTER — Encounter (HOSPITAL_COMMUNITY): Payer: Self-pay | Admitting: Emergency Medicine

## 2011-08-04 DIAGNOSIS — R05 Cough: Secondary | ICD-10-CM

## 2011-08-04 DIAGNOSIS — IMO0001 Reserved for inherently not codable concepts without codable children: Secondary | ICD-10-CM | POA: Insufficient documentation

## 2011-08-04 DIAGNOSIS — R21 Rash and other nonspecific skin eruption: Secondary | ICD-10-CM | POA: Insufficient documentation

## 2011-08-04 DIAGNOSIS — R509 Fever, unspecified: Secondary | ICD-10-CM | POA: Insufficient documentation

## 2011-08-04 DIAGNOSIS — R110 Nausea: Secondary | ICD-10-CM | POA: Insufficient documentation

## 2011-08-04 DIAGNOSIS — R51 Headache: Secondary | ICD-10-CM | POA: Insufficient documentation

## 2011-08-04 DIAGNOSIS — R059 Cough, unspecified: Secondary | ICD-10-CM | POA: Insufficient documentation

## 2011-08-04 LAB — CBC
Hemoglobin: 10.2 g/dL — ABNORMAL LOW (ref 12.0–15.0)
MCH: 26.4 pg (ref 26.0–34.0)
MCHC: 32.2 g/dL (ref 30.0–36.0)
Platelets: 324 10*3/uL (ref 150–400)
RDW: 14.5 % (ref 11.5–15.5)

## 2011-08-04 LAB — BASIC METABOLIC PANEL
Calcium: 8.8 mg/dL (ref 8.4–10.5)
Creatinine, Ser: 0.76 mg/dL (ref 0.50–1.10)
GFR calc non Af Amer: >90 mL/min (ref >90)
Glucose, Bld: 95 mg/dL (ref 70–99)
Sodium: 134 mEq/L — ABNORMAL LOW (ref 135–145)

## 2011-08-04 MED ORDER — SODIUM CHLORIDE 0.9 % IV BOLUS (SEPSIS)
1000.0000 mL | Freq: Once | INTRAVENOUS | Status: AC
  Administered 2011-08-04: 1000 mL via INTRAVENOUS

## 2011-08-04 MED ORDER — FAMOTIDINE 20 MG PO TABS
20.0000 mg | ORAL_TABLET | Freq: Once | ORAL | Status: AC
  Administered 2011-08-04: 20 mg via ORAL
  Filled 2011-08-04: qty 1

## 2011-08-04 MED ORDER — ACETAMINOPHEN 325 MG PO TABS
650.0000 mg | ORAL_TABLET | Freq: Once | ORAL | Status: AC
  Administered 2011-08-04: 650 mg via ORAL
  Filled 2011-08-04: qty 2

## 2011-08-04 MED ORDER — AZITHROMYCIN 250 MG PO TABS
500.0000 mg | ORAL_TABLET | Freq: Once | ORAL | Status: AC
  Administered 2011-08-04: 500 mg via ORAL
  Filled 2011-08-04: qty 2

## 2011-08-04 MED ORDER — DIPHENHYDRAMINE HCL 25 MG PO CAPS
25.0000 mg | ORAL_CAPSULE | Freq: Once | ORAL | Status: AC
  Administered 2011-08-04: 25 mg via ORAL
  Filled 2011-08-04: qty 1

## 2011-08-04 MED ORDER — IBUPROFEN 800 MG PO TABS
800.0000 mg | ORAL_TABLET | Freq: Once | ORAL | Status: AC
  Administered 2011-08-04: 800 mg via ORAL
  Filled 2011-08-04: qty 1

## 2011-08-04 MED ORDER — SODIUM CHLORIDE 0.9 % IN NEBU
INHALATION_SOLUTION | RESPIRATORY_TRACT | Status: AC
  Administered 2011-08-04: 3 mL
  Filled 2011-08-04: qty 3

## 2011-08-04 MED ORDER — PREDNISONE 20 MG PO TABS
60.0000 mg | ORAL_TABLET | Freq: Once | ORAL | Status: AC
  Administered 2011-08-04: 60 mg via ORAL
  Filled 2011-08-04: qty 3

## 2011-08-04 MED ORDER — ALBUTEROL SULFATE (5 MG/ML) 0.5% IN NEBU
2.5000 mg | INHALATION_SOLUTION | Freq: Once | RESPIRATORY_TRACT | Status: AC
  Administered 2011-08-04: 2.5 mg via RESPIRATORY_TRACT
  Filled 2011-08-04: qty 0.5

## 2011-08-04 MED ORDER — ALBUTEROL SULFATE HFA 108 (90 BASE) MCG/ACT IN AERS: 1.0000 | INHALATION_SPRAY | Freq: Four times a day (QID) | RESPIRATORY_TRACT | Status: DC | PRN

## 2011-08-04 MED ORDER — PREDNISONE 10 MG PO TABS: 20.0000 mg | ORAL_TABLET | Freq: Every day | ORAL | Status: AC

## 2011-08-04 MED ORDER — AZITHROMYCIN 250 MG PO TABS: ORAL_TABLET | ORAL | Status: DC

## 2011-08-04 NOTE — ED Notes (Signed)
Pt has large incision site to lower abd, extending from left side to right side, from surgery on 07/07/11. Pt states surgery was for endometriosis, scar revision and abdominoplasty. There is an open area in the middle of incision with pink granulation tissue visualized. No drainage or odor noted. Pt also has generalized rash all over that itches,she said it appeared yesterday.

## 2011-08-04 NOTE — ED Notes (Signed)
Pt had abd surgery for endometriosis on 11/30. Pt has multiple c/o. Pt c/o cough x 2 weeks, negative flu screen. Pt c/o joint pain x 3 days with chills/n/weakness. Generalized rash x 1 day. Hand pain/swelling/tingling today. Denies v/d/fever.

## 2011-08-04 NOTE — ED Provider Notes (Signed)
History     CSN: [id number]  Arrival date & time 08/04/11  1154   First MD Initiated Contact with Patient 08/04/11 1444      Chief Complaint  Patient presents with  . Cough  . Joint Pain  . Rash    (Consider location/radiation/quality/duration/timing/severity/associated sxs/prior treatment) HPI Comments: [name] is a 25 y.o. female who presents to the Emergency Department complaining of cough, fever, body aches, nausea, headache that began 3 days ago. Patient had a similar illness 10 days ago and was improving. Developed body aches 3 days ago with continued cough, fever began yesterday along with rash and hives. Denies vomiting or diarrhea, shortness of breath or chest pain.  Patient is a 25 y.o. female presenting with cough and rash.  Cough  Rash     Past Medical History  Diagnosis Date  . Granuloma, skin     incisional pfannenstiel scar    Past Surgical History  Procedure Date  . Cesarean section   . Nasal sinus surgery 2007  . Tonsillectomy 2008  . Endometrial fulguration 07/07/11    Family History  Problem Relation Age of Onset  . Diabetes Mother   . Hypertension Mother   . Heart disease Father     History  Substance Use Topics  . Smoking status: Never Smoker   . Smokeless tobacco: Never Used  . Alcohol Use: No    OB History    Grav Para Term Preterm Abortions TAB SAB Ect Mult Living   1 1 1       1       Review of Systems  Respiratory: Positive for cough.   Skin: Positive for rash.    Allergies  Adhesive; Phenergan; and Vicodin  Home Medications   Current Outpatient Rx  Name Route Sig Dispense Refill  . GUAIFENESIN 100 MG/5ML PO SOLN Oral Take 5 mLs by mouth every 4 (four) hours as needed. For cough     . IBUPROFEN 200 MG PO TABS Oral Take 400 mg by mouth every 6 (six) hours as needed. For pain      . ORTHO TRI-CYCLEN (28) PO Oral Take 1 tablet by mouth daily.       BP 117/64  Pulse 130  Temp(Src) 99.5 F (37.5 C) (Oral)   Resp 20  Ht 5\' 9"  (1.753 m)  Wt 275 lb (124.739 kg)  BMI 40.61 kg/m2  SpO2 97%  LMP 07/21/2011  Physical Exam  Nursing note and vitals reviewed. Constitutional: She is oriented to person, place, and time. She appears well-developed and well-nourished.  HENT:  Head: Normocephalic.  Right Ear: External ear normal.  Left Ear: External ear normal.  Nose: Nose normal.  Mouth/Throat: Oropharynx is clear and moist.  Eyes: Conjunctivae and EOM are normal. Pupils are equal, round, and reactive to light.  Neck: Normal range of motion. Neck supple.  Cardiovascular: Normal rate, normal heart sounds and intact distal pulses.   Pulmonary/Chest: Effort normal and breath sounds normal.       coughing  Abdominal: Soft.  Musculoskeletal: Normal range of motion.  Neurological: She is alert and oriented to person, place, and time.  Skin: Skin is warm and dry.       Fine rash to arms and chest. Fading hives present on stomach.    ED Course  Procedures (including critical care time)      MDM  Patient with several days of viral symptoms including cough, headache, myalgias, hives, fevers, chills. She has used tylenol and  ibuprofen with minimal relief. Received albuterol, prednisone, benadryl, pepcid with improvement i cough and fading of rash. Pt stable in ED with no significant deterioration in condition.The patient appears reasonably screened and/or stabilized for discharge and I doubt any other medical condition or other [facility] requiring further screening, evaluation, or treatment in the ED at this time prior to discharge.  MDM Reviewed: nursing note and vitals           Gypsy Balsam. Olin Hauser, MD 08/04/11 [phone]

## 2011-08-08 ENCOUNTER — Emergency Department (HOSPITAL_COMMUNITY): Payer: BC Managed Care – PPO

## 2011-08-08 ENCOUNTER — Encounter (HOSPITAL_COMMUNITY): Payer: Self-pay | Admitting: *Deleted

## 2011-08-08 ENCOUNTER — Emergency Department (HOSPITAL_COMMUNITY)
Admission: EM | Admit: 2011-08-08 | Discharge: 2011-08-08 | Disposition: A | Discharge status: Home / Self Care | Payer: BC Managed Care – PPO | Attending: Emergency Medicine | Admitting: Emergency Medicine

## 2011-08-08 DIAGNOSIS — R6883 Chills (without fever): Secondary | ICD-10-CM | POA: Insufficient documentation

## 2011-08-08 DIAGNOSIS — IMO0001 Reserved for inherently not codable concepts without codable children: Secondary | ICD-10-CM | POA: Insufficient documentation

## 2011-08-08 DIAGNOSIS — J3489 Other specified disorders of nose and nasal sinuses: Secondary | ICD-10-CM | POA: Insufficient documentation

## 2011-08-08 DIAGNOSIS — L02219 Cutaneous abscess of trunk, unspecified: Secondary | ICD-10-CM | POA: Insufficient documentation

## 2011-08-08 DIAGNOSIS — T8149XA Infection following a procedure, other surgical site, initial encounter: Secondary | ICD-10-CM

## 2011-08-08 DIAGNOSIS — O909 Complication of the puerperium, unspecified: Secondary | ICD-10-CM | POA: Insufficient documentation

## 2011-08-08 DIAGNOSIS — R21 Rash and other nonspecific skin eruption: Secondary | ICD-10-CM | POA: Insufficient documentation

## 2011-08-08 DIAGNOSIS — R059 Cough, unspecified: Secondary | ICD-10-CM | POA: Insufficient documentation

## 2011-08-08 DIAGNOSIS — R05 Cough: Secondary | ICD-10-CM | POA: Insufficient documentation

## 2011-08-08 MED ORDER — SODIUM CHLORIDE 0.9 % IV SOLN: INTRAVENOUS | Status: DC

## 2011-08-08 MED ORDER — SODIUM CHLORIDE 0.9 % IV BOLUS (SEPSIS)
2000.0000 mL | Freq: Once | INTRAVENOUS | Status: AC
  Administered 2011-08-08: 2000 mL via INTRAVENOUS

## 2011-08-08 MED ORDER — HYDROMORPHONE HCL PF 1 MG/ML IJ SOLN: 1.0000 mg | Freq: Once | INTRAMUSCULAR | Status: DC

## 2011-08-08 MED ORDER — IOHEXOL 300 MG/ML  SOLN
100.0000 mL | Freq: Once | INTRAMUSCULAR | Status: AC | PRN
  Administered 2011-08-08: 100 mL via INTRAVENOUS

## 2011-08-08 NOTE — ED Notes (Signed)
Pt states she had a "blister" form on R edge of surgical incision. Last night, wound opened and drained copious amounts of purulent fluid. Pt is s/p scar revision and tummy tuck 07/07/11

## 2011-08-08 NOTE — ED Notes (Signed)
Patient is alert and oriented x3.  She has been given DC instructions with MD follow up visits.  Verbal confirmation was given by patient V/S stable.  Patient DC under own ambulatory power. Patient was not showing any signs of distress on DC

## 2011-08-08 NOTE — ED Provider Notes (Signed)
History     CSN: [id number]  Arrival date & time 08/08/11  [id number]   First MD Initiated Contact with Patient 08/08/11 [phone]      Chief Complaint  Patient presents with  . Post-op Problem  . Wound Infection    (Consider location/radiation/quality/duration/timing/severity/associated sxs/prior treatment) HPI This 26 year old female has had a chronically draining transverse lower abdominal incision for the last couple years after a C-section and 07/07/2011 at plastic surgery for scar revision for this chronically draining wound. The patient's incision had apparently healed well until today when the right side of the incision dehisced slightly after a few days of pain bulging and tenderness to that area and now is draining copious amounts of purulent fluid. She does not have diffuse abdominal pain she has localized abdominal pain to the right portion of the scar area only with drainage of the pus without surrounding cellulitis. She has no fever today. Over the last several days she has had cough nasal congestion body aches chills myalgias arthralgias fine rash which is now gone. She has minimal cough now. She has no headache and no confusion no shortness of breath no hallucinations and otherwise feels much better now. The pain to the incisional area is improving now as it was constant and gradually worsening over the last few days but now that her abscesses draining from her wound she feels like the pain is much less intense it is now moderately severe and well localized without radiation or associated symptoms. She is no nausea vomiting diarrhea dysuria or other concerns. Her scar revision was performed at [facility]. Past Medical History  Diagnosis Date  . Granuloma, skin     incisional pfannenstiel scar  . Arthritis     Past Surgical History  Procedure Date  . Cesarean section   . Nasal sinus surgery 2007  . Tonsillectomy 2008  . Endometrial fulguration 07/07/11    Family History    Problem Relation Age of Onset  . Diabetes Mother   . Hypertension Mother   . Heart disease Father     History  Substance Use Topics  . Smoking status: Never Smoker   . Smokeless tobacco: Never Used  . Alcohol Use: No    OB History    Grav Para Term Preterm Abortions TAB SAB Ect Mult Living   1 1 1       1       Review of Systems  Constitutional: Positive for chills. Negative for fever.       10 Systems reviewed and are negative for acute change except as noted in the HPI.  HENT: Positive for congestion.   Eyes: Negative for discharge and redness.  Respiratory: Positive for cough. Negative for shortness of breath.   Cardiovascular: Negative for chest pain.  Gastrointestinal: Positive for abdominal pain. Negative for vomiting and diarrhea.  Genitourinary: Negative for dysuria.  Musculoskeletal: Positive for myalgias and arthralgias. Negative for back pain.  Skin: Positive for rash and wound.  Neurological: Negative for syncope, numbness and headaches.  Psychiatric/Behavioral:       No behavior change.    Allergies  Adhesive; Phenergan; and Vicodin  Home Medications   Current Outpatient Rx  Name Route Sig Dispense Refill  . ALBUTEROL SULFATE HFA 108 (90 BASE) MCG/ACT IN AERS Inhalation Inhale 1-2 puffs into the lungs every 6 (six) hours as needed for wheezing. 1 Inhaler 0  . AZITHROMYCIN 250 MG PO TABS  1 every day until finished. 4 tablet 0  . GUAIFENESIN  100 MG/5ML PO SOLN Oral Take 5 mLs by mouth every 4 (four) hours as needed. For cough     . IBUPROFEN 200 MG PO TABS Oral Take 400 mg by mouth every 6 (six) hours as needed. For pain      . ORTHO TRI-CYCLEN (28) PO Oral Take 1 tablet by mouth daily.     Marland Kitchen PREDNISONE 10 MG PO TABS Oral Take 2 tablets (20 mg total) by mouth daily. 10 tablet 0    BP 154/87  Pulse 120  Temp(Src) 99.1 F (37.3 C) (Oral)  Resp 18  SpO2 99%  LMP 07/21/2011  Physical Exam  Nursing note and vitals reviewed. Constitutional:        Awake, alert, nontoxic appearance.  HENT:  Head: Atraumatic.  Eyes: Pupils are equal, round, and reactive to light. Right eye exhibits no discharge. Left eye exhibits no discharge.  Neck: Neck supple.  Cardiovascular: Regular rhythm.   No murmur heard. Pulmonary/Chest: Effort normal and breath sounds normal. No respiratory distress. She has no wheezes. She has no rales. She exhibits no tenderness.  Abdominal: Soft. Bowel sounds are normal. She exhibits no mass. There is tenderness. There is no rebound and no guarding.       Her lower abdominal transverse incision his spontaneously draining wound abscess on the right side with 1-2 cm of dehiscence worthy copious purulent drainage is exiting there is no surrounding cellulitis or subcutaneous emphysema there is localized tenderness only without rebound  Musculoskeletal: She exhibits no edema and no tenderness.       Baseline ROM, no obvious new focal weakness.  Neurological: She is alert.       Mental status and motor strength appears baseline for patient and situation.  Skin: No rash noted.  Psychiatric: She has a normal mood and affect.    ED Course  Procedures (including critical care time) The patient is pain-free and does not want analgesics. She has no fever no redness or cellulitis around the wound which is spontaneously draining with CT scan showing no evidence of any deep infection, also the patient has no diffuse abdominal pain or vomiting with systemic infection unlikely site did not think antibiotics are indicated since she has a localized wound infection which is very spontaneously draining. The patient understands and agrees with this assessment and plan as well. I believe she is stable for outpatient followup with [facility].  A wound culture will be obtained in case [facility] wants to check the result however I do not think antibiotics are indicated at this time and the patient agrees since the abscess is already  draining.  Labs Reviewed  WOUND CULTURE   Ct Abdomen Pelvis W Contrast  08/08/2011  *RADIOLOGY REPORT* No comparison studies available. Clinical Data: Postop wound abscess.  Rule out deep for abscess or fistula.  Status post scar revision from a tummy tuck procedure.  CT ABDOMEN AND PELVIS WITH CONTRAST  Technique:  Multidetector CT imaging of the abdomen and pelvis was performed following the standard protocol during bolus administration of intravenous contrast.  Contrast: 163mL OMNIPAQUE IOHEXOL 300 MG/ML IV SOLN  Comparison: None.  Findings: There is a small collection along the right aspect of a low anterior abdominal wall transverse incision. It measures 27 mm x 31 mm x 58 mm in size.  It extends to the anterior margin of the right rectus abdominus muscle.  There is no evidence of a deep for abscess or fistula.  The lung bases are clear.  The heart is normal in size.  Normal liver, spleen, gallbladder and pancreas.  No bile duct dilation.  2 mm nonobstructing stone in the right kidney lower pole.  Kidneys otherwise normal.  Normal ureters and bladder.  No adrenal masses. Uterus and adnexa are unremarkable.  No adenopathy.  No abnormal fluid collections.  Normal bowel.  Normal appendix. No significant bony abnormality.  IMPRESSION: Small collection along the right lower anterior abdominal wall, along the incision, consistent with a small incisional abscess.  No evidence of a deeper abscess or fistula.  Small nonobstructing stone in the right kidney.  No other abnormalities.  Original Report Authenticated By:      1. Post-operative wound abscess       MDM  I doubt any other [facility] precluding discharge at this time including, but not necessarily limited to the following:sepsis, peritonitis.        Babette Relic, MD 08/08/11 [phone]

## 2011-08-11 LAB — WOUND CULTURE

## 2011-08-12 NOTE — ED Notes (Signed)
+   wound culture. Chart sent to EDP office for review 

## 2011-08-15 NOTE — ED Notes (Signed)
No abx started per Dr Stevie Kern culture obtained for [facility].

## 2011-09-12 ENCOUNTER — Encounter: Payer: Self-pay | Admitting: Gynecology

## 2011-09-12 ENCOUNTER — Ambulatory Visit (INDEPENDENT_AMBULATORY_CARE_PROVIDER_SITE_OTHER): Payer: BC Managed Care – PPO | Admitting: Gynecology

## 2011-09-12 DIAGNOSIS — Z309 Encounter for contraceptive management, unspecified: Secondary | ICD-10-CM

## 2011-09-12 NOTE — Progress Notes (Signed)
Patient 26 year old who presented to the office today to discuss contraceptive options. Several years should been on Ortho Tri-Cyclen generic. She is overweight we discussed different contraceptive options to include oral contraceptive pills, subdermal implant, NuvaRing, as well as IUDs. Since patient has a newborn and is overweight and IUD would be the best route especially a nonhormonal one such as the Bucks IUD. The risks benefits and pros and cons as well as failure rate were discussed. Patient will check her insurance verification and we'll come back next week during her menses to place the IUD. Her Pap smear was done last year in another facility and according to patient report be normal. 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 She is doing well otherwise.

## 2011-09-12 NOTE — Patient Instructions (Signed)
Intrauterine Device Insertion  Most often, an intrauterine device (IUD) is inserted into the uterus to prevent pregnancy. There are 2 types of IUDs available:  Copper IUD. This type of IUD creates an environment that is not favorable to sperm survival. The mechanism of action of the copper IUD is not known for certain. It can stay in place for 10 years.   Hormone IUD. This type of IUD contains the hormone progestin (synthetic progesterone). The progestin thickens the cervical mucus and prevents sperm from entering the uterus, and it also thins the uterine lining. There is no evidence that the hormone IUD prevents implantation. The hormone IUD can stay in place for up to 5 years.  An IUD is the most cost-effective birth control if left in place for the full duration. It may be removed at any time. LET YOUR CAREGIVER KNOW ABOUT:  Sensitivity to metals.   Medicines taken including herbs, eyedrops, over-the-counter medicines, and creams.   Use of steroids (by mouth or creams).   Previous problems with anesthetics or numbing medicine.   Previous gynecological surgery.   History of blood clots or clotting disorders.   Possibility of pregnancy.   Menstrual irregularities.   Concerns regarding unusual vaginal discharge or odors.   Previous experience with an IUD.   Other health problems.  RISKS AND COMPLICATIONS  Accidental puncture (perforation) of the uterus.   Accidental placement of the IUD either in the muscle layer of the uterus (myometrium) or outside the uterus. If this happen, the IUD can be found essentially floating around the bowels. When this happens, the IUD must be taken out surgically.   The IUD may fall out of the uterus (expulsion). This is more common in women who have recently had a child.    Pregnancy in the fallopian tube (ectopic).  BEFORE THE PROCEDURE  Schedule the IUD insertion for  when you will have your menstrual period or right after, to make sure you are not pregnant. Placement of the IUD is better tolerated shortly after a menstrual cycle.   You may need to take tests or be examined to make sure you are not pregnant.   You may be required to take a pregnancy test.   You may be required to get checked for sexually transmitted infections (STIs) prior to placement. Placing an IUD in someone who has an infection can make an infection worse.   You may be given a pain reliever to take 1 or 2 hours before the procedure.   An exam will be performed to determine the size and position of your uterus.   Ask your caregiver about changing or stopping your regular medicines.  PROCEDURE   A tool (speculum) is placed in the vagina. This allows your caregiver to see the lower part of the uterus (cervix).   The cervix is prepped with a medicine that lowers the risk of infection.   You may be given a medicine to numb each side of the cervix (intracervical or paracervical block). This is used to block and control any discomfort with insertion.   A tool (uterine sound) is inserted into the uterus to determine the length of the uterine cavity and the direction the uterus may be tilted.   A slim instrument (IUD inserter) is inserted through the cervical canal and into your uterus.   The IUD is placed in the uterine cavity and the insertion device is removed.   The nylon string that is attached to the IUD, and used  for eventual IUD removal, is trimmed. It is trimmed so that it lays high in the vagina, just outside the cervix.  AFTER THE PROCEDURE  You may have bleeding after the procedure. This is normal. It varies from light spotting for a few days to menstrual-like bleeding.   You may have mild cramping.   Practice checking the string coming out of the cervix to make sure the IUD remains in the uterus. If you cannot feel the string, you should schedule a "string check" with  your caregiver.   If you had a hormone IUD inserted, expect that your period may be lighter or nonexistent within a year's time (though this is not always the case). There may be delayed fertility with the hormone IUD as a result of its progesterone effect. When you are ready to become pregnant, it is suggested to have the IUD removed up to 1 year in advance.   Yearly exams are advised.  Document Released: 03/22/2011 Document Revised: 04/05/2011 Document Reviewed: 03/22/2011 [facility] Patient Information 2012 [place].

## 2011-09-13 ENCOUNTER — Telehealth: Payer: Self-pay | Admitting: *Deleted

## 2011-09-13 ENCOUNTER — Other Ambulatory Visit: Payer: Self-pay | Admitting: *Deleted

## 2011-09-13 DIAGNOSIS — Z3049 Encounter for surveillance of other contraceptives: Secondary | ICD-10-CM

## 2011-09-13 NOTE — Telephone Encounter (Signed)
Lm for patient to call about benefits for [employer].

## 2011-09-13 NOTE — Progress Notes (Signed)
paraguard covered 100%.  Will call back to schedule insert with JF.

## 2011-09-13 NOTE — Telephone Encounter (Signed)
Patient informed [employer] covered 100%.  Will call back to schedule insert.

## 2011-09-15 ENCOUNTER — Other Ambulatory Visit: Payer: Self-pay | Admitting: Gynecology

## 2011-09-15 ENCOUNTER — Ambulatory Visit (INDEPENDENT_AMBULATORY_CARE_PROVIDER_SITE_OTHER): Payer: BC Managed Care – PPO | Admitting: Gynecology

## 2011-09-15 ENCOUNTER — Encounter: Payer: Self-pay | Admitting: Gynecology

## 2011-09-15 VITALS — BP 128/80

## 2011-09-15 DIAGNOSIS — Z30430 Encounter for insertion of intrauterine contraceptive device: Secondary | ICD-10-CM

## 2011-09-15 DIAGNOSIS — Z3049 Encounter for surveillance of other contraceptives: Secondary | ICD-10-CM

## 2011-09-15 NOTE — Progress Notes (Signed)
Patient is a 26 year old gravida 2 para 2 who presented to the office today for placement of a ParaGard T380A IUD. Patient previously in the office for consultation discussing different contraceptive options. The risks benefits and pros and cons of the IUD were discussed. Patient fully where this form of contraception is 99% effective and is good for 10 years but totally reversible. Consent form was signed all questions are answered. Patient currently menstruating.  Pelvic exam: Bartholin urethra Skene glands: Within normal limits Vagina: Menstrual blood present Cervix: Menstrual blood present Uterus: Anteverted normal size shape and consistency Adnexa: No palpable masses or tenderness Rectal: Not examined  The vagina and cervix was cleansed with Betadine solution. Single-tooth tenaculum was placed on the anterior cervical lip. The uterus sounded to 7-1/2 cm. And the ParaGard T380A IUD was inserted in a sterile fashion without any complications. The string was trimmed. The single-tooth tenaculum was removed. Patient will return back to the office in one month for followup. Her last full GYN exam and Pap smear was in June of 2012 at a different facility and she states her Pap smears been normal.

## 2011-09-15 NOTE — Patient Instructions (Signed)
Follow up appointment one month

## 2011-10-13 ENCOUNTER — Encounter: Payer: Self-pay | Admitting: Gynecology

## 2011-10-13 ENCOUNTER — Ambulatory Visit (INDEPENDENT_AMBULATORY_CARE_PROVIDER_SITE_OTHER): Payer: BC Managed Care – PPO | Admitting: Gynecology

## 2011-10-13 VITALS — BP 128/70

## 2011-10-13 DIAGNOSIS — Z30431 Encounter for routine checking of intrauterine contraceptive device: Secondary | ICD-10-CM

## 2011-10-13 NOTE — Progress Notes (Signed)
Patient is a 26 year old gravida 2 para 2 who presented to the office today for her one month followup after having had a Mirena IUD placed. Patient asymptomatic otherwise.  Exam: Abdomen: Soft nontender no rebound or guarding Bartholin urethra Skene glands: Within normal limits Vagina: Small amount of menstrual blood present Cervix: IUD string visualized Uterus: Anteverted normal size shape and consistency Adnexa: No palpable masses or tenderness Rectal: Not examined   Assessment/plan: Normal one month followup after having placed a Mirena IUD. Patient scheduled to return later in the year for her annual gynecological examination.

## 2013-02-10 ENCOUNTER — Encounter: Payer: Self-pay | Admitting: Women's Health

## 2013-02-13 ENCOUNTER — Encounter: Payer: Self-pay | Admitting: Women's Health

## 2013-02-13 ENCOUNTER — Other Ambulatory Visit (HOSPITAL_COMMUNITY)
Admission: RE | Admit: 2013-02-13 | Discharge: 2013-02-13 | Disposition: A | Discharge status: Home / Self Care | Payer: BC Managed Care – PPO | Source: Ambulatory Visit | Attending: Women's Health | Admitting: Women's Health

## 2013-02-13 ENCOUNTER — Ambulatory Visit (INDEPENDENT_AMBULATORY_CARE_PROVIDER_SITE_OTHER): Payer: BC Managed Care – PPO | Admitting: Women's Health

## 2013-02-13 VITALS — BP 140/90 | Ht 69.5 in | Wt 295.0 lb

## 2013-02-13 DIAGNOSIS — Z833 Family history of diabetes mellitus: Secondary | ICD-10-CM

## 2013-02-13 DIAGNOSIS — Z30432 Encounter for removal of intrauterine contraceptive device: Secondary | ICD-10-CM

## 2013-02-13 DIAGNOSIS — Z01419 Encounter for gynecological examination (general) (routine) without abnormal findings: Secondary | ICD-10-CM | POA: Insufficient documentation

## 2013-02-13 DIAGNOSIS — Z1151 Encounter for screening for human papillomavirus (HPV): Secondary | ICD-10-CM | POA: Insufficient documentation

## 2013-02-13 LAB — CBC WITH DIFFERENTIAL/PLATELET
Basophils Relative: 0 % (ref 0–1)
HCT: 34.9 % — ABNORMAL LOW (ref 36.0–46.0)
Hemoglobin: 11.9 g/dL — ABNORMAL LOW (ref 12.0–15.0)
Lymphocytes Relative: 27 % (ref 12–46)
Lymphs Abs: 2.6 10*3/uL (ref 0.7–4.0)
MCHC: 34.1 g/dL (ref 30.0–36.0)
Monocytes Absolute: 0.7 10*3/uL (ref 0.1–1.0)
Monocytes Relative: 8 % (ref 3–12)
Neutro Abs: 6 10*3/uL (ref 1.7–7.7)
Neutrophils Relative %: 63 % (ref 43–77)
RBC: 4.57 MIL/uL (ref 3.87–5.11)

## 2013-02-13 LAB — GLUCOSE, RANDOM: Glucose, Bld: 88 mg/dL (ref 70–99)

## 2013-02-13 NOTE — Addendum Note (Signed)
Addended by: Alen Blew on: 02/13/2013 12:58 PM   Modules accepted: Orders

## 2013-02-13 NOTE — Patient Instructions (Addendum)

## 2013-02-13 NOTE — Progress Notes (Signed)
[name] [birth year] [id number]    History:    The patient presents for annual exam.  Monthly cycle with ParaGard IUD that was placed 09/2011. Remarried, requesting it to be removed desires conception. Gardasil series completed 2012. Normal Pap history.   Past medical history, past surgical history, family history and social history were all reviewed and documented in the EPIC chart. Works for the health department. History of endometriosis. Mother diabetes and hypertension. 74-year-old  [name] struggling with parents divorce.   ROS:  A  ROS was performed and pertinent positives and negatives are included in the history.  Exam:  Filed Vitals:   02/13/13 0956  BP: 140/90    General appearance:  Normal Head/Neck:  Normal, without cervical or supraclavicular adenopathy. Thyroid:  Symmetrical, normal in size, without palpable masses or nodularity. Respiratory  Effort:  Normal  Auscultation:  Clear without wheezing or rhonchi Cardiovascular  Auscultation:  Regular rate, without rubs, murmurs or gallops  Edema/varicosities:  Not grossly evident Abdominal  Soft,nontender, without masses, guarding or rebound.  Liver/spleen:  No organomegaly noted  Hernia:  None appreciated  Skin  Inspection:  Grossly normal  Palpation:  Grossly normal Neurologic/psychiatric  Orientation:  Normal with appropriate conversation.  Mood/affect:  Normal  Genitourinary    Breasts: Examined lying and sitting.     Right: Without masses, retractions, discharge or axillary adenopathy.     Left: Without masses, retractions, discharge or axillary adenopathy.   Inguinal/mons:  Normal without inguinal adenopathy  External genitalia:  Normal  BUS/Urethra/Skene's glands:  Normal  Bladder:  Normal  Vagina:  Normal  Cervix:  Normal IUD strings visible,               Uterus:   normal in size, shape and contour.  Midline and mobile  Adnexa/parametria:     Rt: Without masses or tenderness.   Lt: Without masses  or tenderness.  Anus and perineum: Normal  Digital rectal exam: Normal sphincter tone without palpated masses or tenderness  Assessment/Plan:  27 y.o. M. WF G1 P1 for annual exam.     ParaGard IUD Morbid obesity Blood pressure 140/90  Plan: IUD strings grasped and removed intact shown to patient and discarded. Instructed to return to office for viability ultrasound with missed cycle, [facility] delivered first child. SBE's, increase regular exercise and decrease calories for weight loss. Continue prenatal vitamin daily. CBC, glucose, UA, Pap. Safe pregnancy behaviors reviewed. Check blood pressure at work if continues greater than 130/80 followup with primary care for possible medication. Continue counseling for blended family/joint custody issues.   Huel Cote [facility], 10:31 AM 02/13/2013

## 2013-02-14 LAB — URINALYSIS W MICROSCOPIC + REFLEX CULTURE
Casts: NONE SEEN
Glucose, UA: NEGATIVE mg/dL
Leukocytes, UA: NEGATIVE
Nitrite: NEGATIVE
Specific Gravity, Urine: 1.012 (ref 1.005–1.030)
Squamous Epithelial / LPF: NONE SEEN
pH: 7.5 (ref 5.0–8.0)

## 2013-08-07 NOTE — L&D Delivery Note (Signed)
Patient is 28 y.o. G3P1011 [redacted]w[redacted]d admitted for IOL/TOLAC 2/2 A1DM and cHTN currently on magnesium for superimposed preeclampsia   Delivery Note At 8:28 PM a viable female was delivered via VBAC, Vacuum Assisted (Presentation: ;  ).  APGAR: 7, 9; weight 7 lb 9.2 oz (3435 g).  Placenta with the following complications: cord avulsion, manual extraction of placenta with large section of membranes retained and subsequently removed.  1066mcg cytotec given prophylactically.  Placenta inspected and appears to have had a large abruption.  Kiwi vacuum assisted vaginal delivery after cesarean 2/2 fetal heart tones audible in 3s when baby 3+ station, risks/benefits discussed with parents and were agreeable to risks.  1pop-off.  Cord pH: 7.08  Anesthesia:  epidural Episiotomy:  none Lacerations:  Bilateral labial extending to clitoris with left labial extending to small sulcal Suture Repair: 4.0 monocryl and 3.0 vicryl rapide Est. Blood Loss (mL):  400  Mom to AICU.  Baby to Couplet care / Skin to Skin.  Magnus Crescenzo ROCIO 07/30/2014, 9:14 PM

## 2014-01-20 ENCOUNTER — Encounter: Payer: Self-pay | Admitting: Obstetrics & Gynecology

## 2014-01-20 ENCOUNTER — Ambulatory Visit (INDEPENDENT_AMBULATORY_CARE_PROVIDER_SITE_OTHER): Payer: BC Managed Care – PPO | Admitting: Obstetrics & Gynecology

## 2014-01-20 VITALS — BP 144/96 | HR 103 | Wt 339.0 lb

## 2014-01-20 DIAGNOSIS — Z113 Encounter for screening for infections with a predominantly sexual mode of transmission: Secondary | ICD-10-CM

## 2014-01-20 DIAGNOSIS — Z3491 Encounter for supervision of normal pregnancy, unspecified, first trimester: Secondary | ICD-10-CM

## 2014-01-20 DIAGNOSIS — O09299 Supervision of pregnancy with other poor reproductive or obstetric history, unspecified trimester: Secondary | ICD-10-CM

## 2014-01-20 DIAGNOSIS — Z3480 Encounter for supervision of other normal pregnancy, unspecified trimester: Secondary | ICD-10-CM

## 2014-01-20 DIAGNOSIS — O34219 Maternal care for unspecified type scar from previous cesarean delivery: Secondary | ICD-10-CM

## 2014-01-20 DIAGNOSIS — Z3403 Encounter for supervision of normal first pregnancy, third trimester: Secondary | ICD-10-CM | POA: Insufficient documentation

## 2014-01-20 NOTE — Patient Instructions (Signed)
Vaginal Birth After Cesarean Delivery Vaginal birth after cesarean delivery (VBAC) is giving birth vaginally after previously delivering a baby by a cesarean. In the past, if a woman had a cesarean delivery, all births afterwards would be done by cesarean delivery. This is no longer true. It can be safe for the mother to try a vaginal delivery after having a cesarean delivery.  It is important to discuss VBAC with your health care provider early in the pregnancy so you can understand the risks, benefits, and options. It will give you time to decide what is best in your particular case. The final decision about whether to have a VBAC or repeat cesarean delivery should be between you and your health care provider. Any changes in your health or your baby's health during your pregnancy may make it necessary to change your initial decision about VBAC.  WOMEN WHO PLAN TO HAVE A VBAC SHOULD CHECK WITH THEIR HEALTH CARE PROVIDER TO BE SURE THAT:  The previous cesarean delivery was done with a low transverse uterine cut (incision) (not a vertical classical incision).   The birth canal is big enough for the baby.   There were no other operations on the uterus.   An electronic fetal monitor (EFM) will be on at all times during labor.   An operating room will be available and ready in case an emergency cesarean delivery is needed.   A health care provider and surgical nursing staff will be available at all times during labor to be ready to do an emergency delivery cesarean if necessary.   An anesthesiologist will be present in case an emergency cesarean delivery is needed.   The nursery is prepared and has adequate personnel and necessary equipment available to care for the baby in case of an emergency cesarean delivery. BENEFITS OF VBAC  Shorter stay in the hospital.   Avoidance of risks associated with cesarean delivery, such as:  Surgical complications, such as opening of the incision or  hernia in the incision.  Injury to other organs.  Fever. This can occur if an infection develops after surgery. It can also occur as a reaction to the medicine given to make you numb during the surgery.  Less blood loss and need for blood transfusions.  Lower risk of blood clots and infection.  Shorter recovery.   Decreased risk for having to remove the uterus (hysterectomy).   Decreased risk for the placenta to completely or partially cover the opening of the uterus (placenta previa) with a future pregnancy.   Decrease risk in future labor and delivery. RISKS OF A VBAC  Tearing (rupture) of the uterus. This is occurs in less than 1% of VBACs. The risk of this happening is higher if:  Steps are taken to begin the labor process (induce labor) or stimulate or strengthen contractions (augment labor).   Medicine is used to soften (ripen) the cervix.  Having to remove the uterus (hysterectomy) if it ruptures. VBAC SHOULD NOT BE DONE IF:  The previous cesarean delivery was done with a vertical (classical) or T-shaped incision or you do not know what kind of incision was made.   You had a ruptured uterus.   You have had certain types of surgery on your uterus, such as removal of uterine fibroids. Ask your health care provider about other types of surgeries that prevent you from having a VBAC.  You have certain medical or childbirth (obstetrical) problems.   There are problems with the baby.   You  have had two previous cesarean deliveries and no vaginal deliveries. OTHER FACTS TO KNOW ABOUT VBAC:  It is safe to have an epidural anesthetic with VBAC.   It is safe to turn the baby from a breech position (attempt an external cephalic version).   It is safe to try a VBAC with twins.   VBAC may not be successful if your baby weights 8.8 lb (4 kg) or more. However, weight predictions are not always accurate and should not be used alone to decide if VBAC is right for  you.  There is an increased failure rate if the time between the cesarean delivery and VBAC is less than 19 months.   Your health care provider may advise against a VBAC if you have preeclampsia (high blood pressure, protein in the urine, and swelling of face and extremities).   VBAC is often successful if you previously gave birth vaginally.   VBAC is often successful when the labor starts spontaneously before the due date.   Delivering a baby through a VBAC is similar to having a normal spontaneous vaginal delivery. Document Released: 01/14/2007 Document Revised: 05/14/2013 Document Reviewed: 02/20/2013 [facility] Patient Information 2014 [place], [place].

## 2014-01-20 NOTE — Progress Notes (Signed)
New OB, had wound exploration and revision after cesarean section, endometriosis Dx. Considering VBAC but need notes from surgery at [facility]  Subjective: new ob, has [place] at [facility] 7 weeks    [name] is a G3P1011 [redacted]w[redacted]d being seen today for her first obstetrical visit.  Her obstetrical history is significant for obesity and preeclampsia, CHTN, cesarean section. Patient does intend to breast feed. Pregnancy history fully reviewed.  Patient reports no complaints.  Filed Vitals:   01/20/14 1009  BP: 144/96  Pulse: 103  Weight: 153.769 kg (339 lb)    HISTORY: OB History  Gravida Para Term Preterm AB SAB TAB Ectopic Multiple Living  3 1 1  1 1    1     # Outcome Date GA Lbr Len/2nd Weight Sex Delivery Anes PTL Lv  3 CUR           2 SAB 03/07/13 [redacted]w[redacted]d         1 TRM 10/16/08 [redacted]w[redacted]d    CS        Past Medical History  Diagnosis Date  . Granuloma, skin     incisional pfannenstiel scar  . Arthritis   . Anemia    Past Surgical History  Procedure Laterality Date  . Cesarean section    . Nasal sinus surgery  2007  . Tonsillectomy  2008  . Endometrial fulguration  07/07/11  . Abdominal surgery      TUMMY TUCK EXCISED ENDOMETRIOSIS   Family History  Problem Relation Age of Onset  . Diabetes Mother   . Hypertension Mother   . Heart disease Father      Exam    Uterus:  Fundal Height: 12 cm  Pelvic Exam:    Perineum: No Hemorrhoids   Vulva: normal   Vagina:  normal mucosa   pH:     Cervix: no lesions   Adnexa: normal adnexa   Bony Pelvis: average  System: Breast:  normal appearance, no masses or tenderness   Skin: normal coloration and turgor, no rashes    Neurologic: oriented, normal   Extremities: normal strength, tone, and muscle mass   HEENT sclera clear, anicteric   Mouth/Teeth mucous membranes moist, pharynx normal without lesions   Neck supple and no masses   Cardiovascular: regular rate and rhythm   Respiratory:  appears well, vitals normal, no  respiratory distress, acyanotic, normal RR, neck free of mass or lymphadenopathy   Abdomen: large transverse scar   Urinary: urethral meatus normal      Assessment:    Pregnancy: [id number] Patient Active Problem List   Diagnosis Date Noted  . Supervision of normal pregnancy in first trimester 01/20/2014  . Previous cesarean delivery, delivered, with or without mention of antepartum condition 01/20/2014  . Morbid obesity 02/13/2013        Plan:     Initial labs drawn. Prenatal vitamins. Problem list reviewed and updated. Genetic Screening discussed First Screen: ordered.  Ultrasound discussed; fetal survey: 19-20 weeks.  Follow up in 4 weeks. 50% of 30 min visit spent on counseling and coordination of care.  Op notes, considering VBAC   ARNOLD,[name] 01/20/2014

## 2014-01-21 LAB — OBSTETRIC PANEL
Antibody Screen: NEGATIVE
Basophils Absolute: 0 10*3/uL (ref 0.0–0.1)
Basophils Relative: 0 % (ref 0–1)
EOS ABS: 0.1 10*3/uL (ref 0.0–0.7)
Eosinophils Relative: 1 % (ref 0–5)
HCT: 37.6 % (ref 36.0–46.0)
HEP B S AG: NEGATIVE
Hemoglobin: 12.2 g/dL (ref 12.0–15.0)
LYMPHS ABS: 2.2 10*3/uL (ref 0.7–4.0)
Lymphocytes Relative: 20 % (ref 12–46)
MCH: 27.2 pg (ref 26.0–34.0)
MCHC: 32.4 g/dL (ref 30.0–36.0)
MCV: 83.9 fL (ref 78.0–100.0)
Monocytes Absolute: 1 10*3/uL (ref 0.1–1.0)
Monocytes Relative: 9 % (ref 3–12)
NEUTROS PCT: 70 % (ref 43–77)
Neutro Abs: 7.6 10*3/uL (ref 1.7–7.7)
PLATELETS: 215 10*3/uL (ref 150–400)
RBC: 4.48 MIL/uL (ref 3.87–5.11)
RDW: 15.6 % — ABNORMAL HIGH (ref 11.5–15.5)
Rh Type: POSITIVE
Rubella: 1.03 Index — ABNORMAL HIGH (ref <0.90)
WBC: 10.9 10*3/uL — AB (ref 4.0–10.5)

## 2014-01-21 LAB — COMPREHENSIVE METABOLIC PANEL
ALBUMIN: 3.9 g/dL (ref 3.5–5.2)
ALK PHOS: 83 U/L (ref 39–117)
ALT: 11 U/L (ref 0–35)
AST: 16 U/L (ref 0–37)
BUN: 9 mg/dL (ref 6–23)
CO2: 22 mEq/L (ref 19–32)
Calcium: 9.1 mg/dL (ref 8.4–10.5)
Chloride: 103 mEq/L (ref 96–112)
Creat: 0.66 mg/dL (ref 0.50–1.10)
GLUCOSE: 79 mg/dL (ref 70–99)
POTASSIUM: 4 meq/L (ref 3.5–5.3)
SODIUM: 137 meq/L (ref 135–145)
TOTAL PROTEIN: 6.7 g/dL (ref 6.0–8.3)
Total Bilirubin: 0.4 mg/dL (ref 0.2–1.2)

## 2014-01-21 LAB — GC/CHLAMYDIA PROBE AMP
CT Probe RNA: NEGATIVE
GC PROBE AMP APTIMA: NEGATIVE

## 2014-01-21 LAB — HIV ANTIBODY (ROUTINE TESTING W REFLEX): HIV 1&2 Ab, 4th Generation: NONREACTIVE

## 2014-01-22 ENCOUNTER — Encounter: Payer: Self-pay | Admitting: *Deleted

## 2014-01-22 ENCOUNTER — Telehealth: Payer: Self-pay | Admitting: *Deleted

## 2014-01-22 LAB — CULTURE, OB URINE

## 2014-01-22 NOTE — Telephone Encounter (Signed)
Called pt to adv all labs WNL - LMOM  

## 2014-01-22 NOTE — Telephone Encounter (Signed)
Error

## 2014-01-26 ENCOUNTER — Telehealth: Payer: Self-pay | Admitting: *Deleted

## 2014-01-26 DIAGNOSIS — Z139 Encounter for screening, unspecified: Secondary | ICD-10-CM

## 2014-01-26 NOTE — Telephone Encounter (Signed)
Called pt to adv appt at [facility] has been set for 02/02/14 at 11:30am - Left message on mobile phone but voicemail said different name other than the patient - Molli Posey (FOB) at [phone] but can't leave a message - Called pt's mother, Nicholes Mango at [phone] [facility] for her to have [place] call me back.

## 2014-01-26 NOTE — Telephone Encounter (Signed)
Called [facility] to see when pt was going to be scheduled for NT scan - Spoke to [place] -

## 2014-02-02 ENCOUNTER — Other Ambulatory Visit: Payer: Self-pay

## 2014-02-02 ENCOUNTER — Encounter (HOSPITAL_COMMUNITY): Payer: Self-pay

## 2014-02-02 ENCOUNTER — Ambulatory Visit (HOSPITAL_COMMUNITY)
Admission: RE | Admit: 2014-02-02 | Discharge: 2014-02-02 | Disposition: A | Discharge status: Home / Self Care | Payer: BC Managed Care – PPO | Source: Ambulatory Visit | Attending: Obstetrics & Gynecology | Admitting: Obstetrics & Gynecology

## 2014-02-02 DIAGNOSIS — Z36 Encounter for antenatal screening of mother: Secondary | ICD-10-CM | POA: Diagnosis present

## 2014-02-02 DIAGNOSIS — Z139 Encounter for screening, unspecified: Secondary | ICD-10-CM

## 2014-02-17 ENCOUNTER — Ambulatory Visit (INDEPENDENT_AMBULATORY_CARE_PROVIDER_SITE_OTHER): Payer: BC Managed Care – PPO | Admitting: Obstetrics & Gynecology

## 2014-02-17 VITALS — BP 158/108 | HR 95 | Wt 341.0 lb

## 2014-02-17 DIAGNOSIS — O09299 Supervision of pregnancy with other poor reproductive or obstetric history, unspecified trimester: Secondary | ICD-10-CM

## 2014-02-17 DIAGNOSIS — Z3480 Encounter for supervision of other normal pregnancy, unspecified trimester: Secondary | ICD-10-CM

## 2014-02-17 DIAGNOSIS — Z3491 Encounter for supervision of normal pregnancy, unspecified, first trimester: Secondary | ICD-10-CM

## 2014-02-17 DIAGNOSIS — O09292 Supervision of pregnancy with other poor reproductive or obstetric history, second trimester: Secondary | ICD-10-CM

## 2014-02-17 NOTE — Patient Instructions (Signed)
Second Trimester of Pregnancy The second trimester is from week 13 through week 28, months 4 through 6. The second trimester is often a time when you feel your best. Your body has also adjusted to being pregnant, and you begin to feel better physically. Usually, morning sickness has lessened or quit completely, you may have more energy, and you may have an increase in appetite. The second trimester is also a time when the fetus is growing rapidly. At the end of the sixth month, the fetus is about 9 inches long and weighs about 1 pounds. You will likely begin to feel the baby move (quickening) between 18 and 20 weeks of the pregnancy. BODY CHANGES Your body goes through many changes during pregnancy. The changes vary from woman to woman.   Your weight will continue to increase. You will notice your lower abdomen bulging out.  You may begin to get stretch marks on your hips, abdomen, and breasts.  You may develop headaches that can be relieved by medicines approved by your health care provider.  You may urinate more often because the fetus is pressing on your bladder.  You may develop or continue to have heartburn as a result of your pregnancy.  You may develop constipation because certain hormones are causing the muscles that push waste through your intestines to slow down.  You may develop hemorrhoids or swollen, bulging veins (varicose veins).  You may have back pain because of the weight gain and pregnancy hormones relaxing your joints between the bones in your pelvis and as a result of a shift in weight and the muscles that support your balance.  Your breasts will continue to grow and be tender.  Your gums may bleed and may be sensitive to brushing and flossing.  Dark spots or blotches (chloasma, mask of pregnancy) may develop on your face. This will likely fade after the baby is born.  A dark line from your belly button to the pubic area (linea nigra) may appear. This will likely fade  after the baby is born.  You may have changes in your hair. These can include thickening of your hair, rapid growth, and changes in texture. Some women also have hair loss during or after pregnancy, or hair that feels dry or thin. Your hair will most likely return to normal after your baby is born. WHAT TO EXPECT AT YOUR PRENATAL VISITS During a routine prenatal visit:  You will be weighed to make sure you and the fetus are growing normally.  Your blood pressure will be taken.  Your abdomen will be measured to track your baby's growth.  The fetal heartbeat will be listened to.  Any test results from the previous visit will be discussed. Your health care provider may ask you:  How you are feeling.  If you are feeling the baby move.  If you have had any abnormal symptoms, such as leaking fluid, bleeding, severe headaches, or abdominal cramping.  If you have any questions. Other tests that may be performed during your second trimester include:  Blood tests that check for:  Low iron levels (anemia).  Gestational diabetes (between 24 and 28 weeks).  Rh antibodies.  Urine tests to check for infections, diabetes, or protein in the urine.  An ultrasound to confirm the proper growth and development of the baby.  An amniocentesis to check for possible genetic problems.  Fetal screens for spina bifida and Down syndrome. HOME CARE INSTRUCTIONS   Avoid all smoking, herbs, alcohol, and unprescribed   drugs. These chemicals affect the formation and growth of the baby.  Follow your health care provider's instructions regarding medicine use. There are medicines that are either safe or unsafe to take during pregnancy.  Exercise only as directed by your health care provider. Experiencing uterine cramps is a good sign to stop exercising.  Continue to eat regular, healthy meals.  Wear a good support bra for breast tenderness.  Do not use hot tubs, steam rooms, or saunas.  Wear your  seat belt at all times when driving.  Avoid raw meat, uncooked cheese, cat litter boxes, and soil used by cats. These carry germs that can cause birth defects in the baby.  Take your prenatal vitamins.  Try taking a stool softener (if your health care provider approves) if you develop constipation. Eat more high-fiber foods, such as fresh vegetables or fruit and whole grains. Drink plenty of fluids to keep your urine clear or pale yellow.  Take warm sitz baths to soothe any pain or discomfort caused by hemorrhoids. Use hemorrhoid cream if your health care provider approves.  If you develop varicose veins, wear support hose. Elevate your feet for 15 minutes, 3-4 times a day. Limit salt in your diet.  Avoid heavy lifting, wear low heel shoes, and practice good posture.  Rest with your legs elevated if you have leg cramps or low back pain.  Visit your dentist if you have not gone yet during your pregnancy. Use a soft toothbrush to brush your teeth and be gentle when you floss.  A sexual relationship may be continued unless your health care provider directs you otherwise.  Continue to go to all your prenatal visits as directed by your health care provider. SEEK MEDICAL CARE IF:   You have dizziness.  You have mild pelvic cramps, pelvic pressure, or nagging pain in the abdominal area.  You have persistent nausea, vomiting, or diarrhea.  You have a bad smelling vaginal discharge.  You have pain with urination. SEEK IMMEDIATE MEDICAL CARE IF:   You have a fever.  You are leaking fluid from your vagina.  You have spotting or bleeding from your vagina.  You have severe abdominal cramping or pain.  You have rapid weight gain or loss.  You have shortness of breath with chest pain.  You notice sudden or extreme swelling of your face, hands, ankles, feet, or legs.  You have not felt your baby move in over an hour.  You have severe headaches that do not go away with  medicine.  You have vision changes. Document Released: 07/18/2001 Document Revised: 07/29/2013 Document Reviewed: 09/24/2012 [employer] Patient Information 2015 [employer]. This information is not intended to replace advice given to you by your health care provider. Make sure you discuss any questions you have with your health care provider.  

## 2014-02-17 NOTE — Progress Notes (Signed)
BP borderline on repeat. Neg first screen AFP today.  Baseline HTN labs, check BP 2 weeks. Detailed [place] 3 weeks

## 2014-02-18 LAB — COMPREHENSIVE METABOLIC PANEL
ALT: 9 U/L (ref 0–35)
AST: 14 U/L (ref 0–37)
Albumin: 3.5 g/dL (ref 3.5–5.2)
Alkaline Phosphatase: 69 U/L (ref 39–117)
BILIRUBIN TOTAL: 0.3 mg/dL (ref 0.2–1.2)
BUN: 7 mg/dL (ref 6–23)
CALCIUM: 8.8 mg/dL (ref 8.4–10.5)
CO2: 21 mEq/L (ref 19–32)
CREATININE: 0.51 mg/dL (ref 0.50–1.10)
Chloride: 105 mEq/L (ref 96–112)
Glucose, Bld: 80 mg/dL (ref 70–99)
Potassium: 3.9 mEq/L (ref 3.5–5.3)
Sodium: 137 mEq/L (ref 135–145)
Total Protein: 6.4 g/dL (ref 6.0–8.3)

## 2014-02-18 LAB — ALPHA FETOPROTEIN, MATERNAL
AFP: 24.7 [IU]/mL
CURR GEST AGE: 15.5 wks.days
MoM for AFP: 1.77
OPEN SPINA BIFIDA: NEGATIVE

## 2014-03-03 ENCOUNTER — Ambulatory Visit (INDEPENDENT_AMBULATORY_CARE_PROVIDER_SITE_OTHER): Payer: BC Managed Care – PPO | Admitting: Obstetrics and Gynecology

## 2014-03-03 ENCOUNTER — Encounter: Payer: Self-pay | Admitting: Obstetrics and Gynecology

## 2014-03-03 VITALS — BP 152/102 | HR 115 | Wt 343.0 lb

## 2014-03-03 DIAGNOSIS — O34219 Maternal care for unspecified type scar from previous cesarean delivery: Secondary | ICD-10-CM

## 2014-03-03 DIAGNOSIS — Z3480 Encounter for supervision of other normal pregnancy, unspecified trimester: Secondary | ICD-10-CM

## 2014-03-03 DIAGNOSIS — O09292 Supervision of pregnancy with other poor reproductive or obstetric history, second trimester: Secondary | ICD-10-CM

## 2014-03-03 DIAGNOSIS — Z3491 Encounter for supervision of normal pregnancy, unspecified, first trimester: Secondary | ICD-10-CM

## 2014-03-03 DIAGNOSIS — O09299 Supervision of pregnancy with other poor reproductive or obstetric history, unspecified trimester: Secondary | ICD-10-CM

## 2014-03-03 MED ORDER — ASPIRIN 81 MG PO TABS: 81.0000 mg | ORAL_TABLET | Freq: Every day | ORAL | Status: DC

## 2014-03-03 NOTE — Progress Notes (Signed)
Pt states she has n/v daily - urine has large keytones - BP manual reck 146/90

## 2014-03-03 NOTE — Progress Notes (Signed)
Patient is doing well without complaints. Repeat manual BP 146/90. Will continue to monitor closely. Will collect 24 hour urine for protein as baseline. Will start ASA given h/o preeclampsia. Anatomy ultrasound ordered. Patient is interested in [facility] but was told that she should not labor in subsequent pregnancies by [facility]. Will obtain records.

## 2014-03-06 LAB — COMPREHENSIVE METABOLIC PANEL
ALT: 14 U/L (ref 0–35)
AST: 14 U/L (ref 0–37)
Albumin: 3.5 g/dL (ref 3.5–5.2)
Alkaline Phosphatase: 77 U/L (ref 39–117)
BUN: 6 mg/dL (ref 6–23)
CALCIUM: 8.8 mg/dL (ref 8.4–10.5)
CHLORIDE: 105 meq/L (ref 96–112)
CO2: 19 meq/L (ref 19–32)
CREATININE: 0.58 mg/dL (ref 0.50–1.10)
Glucose, Bld: 86 mg/dL (ref 70–99)
Potassium: 3.9 mEq/L (ref 3.5–5.3)
Sodium: 136 mEq/L (ref 135–145)
Total Bilirubin: 0.5 mg/dL (ref 0.2–1.2)
Total Protein: 6.2 g/dL (ref 6.0–8.3)

## 2014-03-07 LAB — PROTEIN, URINE, 24 HOUR
PROTEIN 24H UR: 102 mg/d — AB (ref 50–100)
PROTEIN, URINE: 7 mg/dL

## 2014-03-17 ENCOUNTER — Inpatient Hospital Stay (HOSPITAL_COMMUNITY)
Admission: AD | Admit: 2014-03-17 | Discharge: 2014-03-17 | Disposition: A | Discharge status: Home / Self Care | Payer: BC Managed Care – PPO | Source: Ambulatory Visit | Attending: Obstetrics & Gynecology | Admitting: Obstetrics & Gynecology

## 2014-03-17 ENCOUNTER — Encounter (HOSPITAL_COMMUNITY): Payer: Self-pay | Admitting: *Deleted

## 2014-03-17 DIAGNOSIS — R030 Elevated blood-pressure reading, without diagnosis of hypertension: Secondary | ICD-10-CM | POA: Insufficient documentation

## 2014-03-17 DIAGNOSIS — O210 Mild hyperemesis gravidarum: Secondary | ICD-10-CM | POA: Diagnosis present

## 2014-03-17 DIAGNOSIS — O2390 Unspecified genitourinary tract infection in pregnancy, unspecified trimester: Secondary | ICD-10-CM | POA: Diagnosis not present

## 2014-03-17 DIAGNOSIS — O139 Gestational [pregnancy-induced] hypertension without significant proteinuria, unspecified trimester: Secondary | ICD-10-CM

## 2014-03-17 DIAGNOSIS — Z3491 Encounter for supervision of normal pregnancy, unspecified, first trimester: Secondary | ICD-10-CM

## 2014-03-17 DIAGNOSIS — R51 Headache: Secondary | ICD-10-CM | POA: Diagnosis not present

## 2014-03-17 DIAGNOSIS — N390 Urinary tract infection, site not specified: Secondary | ICD-10-CM | POA: Diagnosis not present

## 2014-03-17 LAB — CBC
HCT: 34.6 % — ABNORMAL LOW (ref 36.0–46.0)
Hemoglobin: 11.6 g/dL — ABNORMAL LOW (ref 12.0–15.0)
MCH: 29 pg (ref 26.0–34.0)
MCHC: 33.5 g/dL (ref 30.0–36.0)
MCV: 86.5 fL (ref 78.0–100.0)
PLATELETS: 201 10*3/uL (ref 150–400)
RBC: 4 MIL/uL (ref 3.87–5.11)
RDW: 14.7 % (ref 11.5–15.5)
WBC: 11.7 10*3/uL — ABNORMAL HIGH (ref 4.0–10.5)

## 2014-03-17 LAB — PROTEIN / CREATININE RATIO, URINE
Creatinine, Urine: 248.99 mg/dL
Protein Creatinine Ratio: 0.06 (ref 0.00–0.15)
Total Protein, Urine: 13.9 mg/dL

## 2014-03-17 LAB — URINE MICROSCOPIC-ADD ON

## 2014-03-17 LAB — URINALYSIS, ROUTINE W REFLEX MICROSCOPIC
Bilirubin Urine: NEGATIVE
Glucose, UA: NEGATIVE mg/dL
Ketones, ur: 15 mg/dL — AB
Nitrite: NEGATIVE
PROTEIN: NEGATIVE mg/dL
Specific Gravity, Urine: 1.02 (ref 1.005–1.030)
Urobilinogen, UA: 0.2 mg/dL (ref 0.0–1.0)
pH: 6.5 (ref 5.0–8.0)

## 2014-03-17 LAB — COMPREHENSIVE METABOLIC PANEL
ALT: 16 U/L (ref 0–35)
AST: 15 U/L (ref 0–37)
Albumin: 3.1 g/dL — ABNORMAL LOW (ref 3.5–5.2)
Alkaline Phosphatase: 89 U/L (ref 39–117)
Anion gap: 14 (ref 5–15)
BUN: 7 mg/dL (ref 6–23)
CO2: 23 meq/L (ref 19–32)
Calcium: 9 mg/dL (ref 8.4–10.5)
Chloride: 101 mEq/L (ref 96–112)
Creatinine, Ser: 0.71 mg/dL (ref 0.50–1.10)
GFR calc Af Amer: >90 mL/min (ref >90)
Glucose, Bld: 90 mg/dL (ref 70–99)
Potassium: 4.1 mEq/L (ref 3.7–5.3)
SODIUM: 138 meq/L (ref 137–147)
TOTAL PROTEIN: 6.6 g/dL (ref 6.0–8.3)
Total Bilirubin: 0.3 mg/dL (ref 0.3–1.2)

## 2014-03-17 LAB — LACTATE DEHYDROGENASE: LDH: 201 U/L (ref 94–250)

## 2014-03-17 LAB — URIC ACID: Uric Acid, Serum: 5 mg/dL (ref 2.4–7.0)

## 2014-03-17 MED ORDER — NITROFURANTOIN MONOHYD MACRO 100 MG PO CAPS: 100.0000 mg | ORAL_CAPSULE | Freq: Two times a day (BID) | ORAL | Status: DC

## 2014-03-17 MED ORDER — METOCLOPRAMIDE HCL 10 MG PO TABS
10.0000 mg | ORAL_TABLET | Freq: Once | ORAL | Status: AC
  Administered 2014-03-17: 10 mg via ORAL
  Filled 2014-03-17: qty 1

## 2014-03-17 NOTE — MAU Note (Signed)
Pt took her BP at work and reports it was 153/90 and HR 120 bpm. Pt reports nausea and vomiting around noon, with current nausea. Denies any vaginal bleeding or cramping.

## 2014-03-17 NOTE — MAU Provider Note (Signed)
Chief Complaint: Elevated BP and HR.  SUBJECTIVE HPI: [name] is a 28 y.o. G3P1011 at [redacted]w[redacted]d by LMP who presents with an episode of nausea and vomiting associated with elevated BP and heart rate and headache.  Patient reports being at work when she became nauseous and vomited.  She developed a retro orbital headache and reports a BP of 153/90 and 120 bpm.  She came to the MAU by personal vehicle and has not had any symptoms since arrival.  Patient denies leakage of fluids, contractions, vaginal bleeding and reports appropriate fetal movement.  Past Medical History  Diagnosis Date  . Granuloma, skin     incisional pfannenstiel scar  . Anemia   . Preterm labor    OB History  Gravida Para Term Preterm AB SAB TAB Ectopic Multiple Living  3 1 1  1 1    1     # Outcome Date GA Lbr Len/2nd Weight Sex Delivery Anes PTL Lv  3 CUR           2 SAB 03/07/13 [redacted]w[redacted]d         1 TRM 10/16/08 [redacted]w[redacted]d    CS        Past Surgical History  Procedure Laterality Date  . Cesarean section    . Nasal sinus surgery  2007  . Tonsillectomy  2008  . Endometrial fulguration  07/07/11  . Abdominal surgery      TUMMY TUCK EXCISED ENDOMETRIOSIS   History   Social History  . Marital Status: Married    Spouse Name: N/A    Number of Children: N/A  . Years of Education: N/A   Occupational History  . Not on file.   Social History Main Topics  . Smoking status: Never Smoker   . Smokeless tobacco: Never Used  . Alcohol Use: No  . Drug Use: No  . Sexual Activity: Yes    Birth Control/ Protection: IUD   Other Topics Concern  . Not on file   Social History Narrative  . No narrative on file   No current facility-administered medications on file prior to encounter.   Current Outpatient Prescriptions on File Prior to Encounter  Medication Sig Dispense Refill  . aspirin 81 MG tablet Take 1 tablet (81 mg total) by mouth daily.  30 tablet  6  . Prenatal Vit w/Fe-Methylfol-FA (PNV PO) Take by mouth.        Allergies  Allergen Reactions  . Adhesive [Tape] Other (See Comments)    Causes blistering of skin  . Promethazine Hcl Nausea And Vomiting  . Vicodin [Hydrocodone-Acetaminophen] Nausea And Vomiting    ROS: Pertinent items in HPI  OBJECTIVE Blood pressure 145/85, pulse 102, temperature 98 F (36.7 C), temperature source Oral, resp. rate 18, height 5\' 9"  (1.753 m), last menstrual period 09/29/2013, SpO2 98.00%. GENERAL: Well-developed, well-nourished female in no acute distress.  HEENT: Normocephalic HEART: normal rate RESP: normal effort ABDOMEN: Deferred EXTREMITIES: Nontender, no edema NEURO: Alert and oriented SPECULUM EXAM: Deferred.   LAB RESULTS Results for orders placed during the hospital encounter of 03/17/14 (from the past 24 hour(s))  URINALYSIS, ROUTINE W REFLEX MICROSCOPIC     Status: Abnormal   Collection Time    03/17/14  5:58 PM      Result Value Ref Range   Color, Urine YELLOW  YELLOW   APPearance CLEAR  CLEAR   Specific Gravity, Urine 1.020  1.005 - 1.030   pH 6.5  5.0 - 8.0   Glucose, UA  NEGATIVE  NEGATIVE mg/dL   Hgb urine dipstick TRACE (*) NEGATIVE   Bilirubin Urine NEGATIVE  NEGATIVE   Ketones, ur 15 (*) NEGATIVE mg/dL   Protein, ur NEGATIVE  NEGATIVE mg/dL   Urobilinogen, UA 0.2  0.0 - 1.0 mg/dL   Nitrite NEGATIVE  NEGATIVE   Leukocytes, UA SMALL (*) NEGATIVE  PROTEIN / CREATININE RATIO, URINE     Status: None   Collection Time    03/17/14  5:58 PM      Result Value Ref Range   Creatinine, Urine 248.99     Total Protein, Urine 13.9     PROTEIN CREATININE RATIO 0.06  0.00 - 0.15  URINE MICROSCOPIC-ADD ON     Status: Abnormal   Collection Time    03/17/14  5:58 PM      Result Value Ref Range   Squamous Epithelial / LPF FEW (*) RARE   WBC, UA 3-6  <3 WBC/hpf   RBC / HPF 3-6  <3 RBC/hpf   Bacteria, UA MANY (*) RARE   Crystals CA OXALATE CRYSTALS (*) NEGATIVE   Urine-Other MUCOUS PRESENT    CBC     Status: Abnormal   Collection Time     03/17/14  6:10 PM      Result Value Ref Range   WBC 11.7 (*) 4.0 - 10.5 K/uL   RBC 4.00  3.87 - 5.11 MIL/uL   Hemoglobin 11.6 (*) 12.0 - 15.0 g/dL   HCT 34.6 (*) 36.0 - 46.0 %   MCV 86.5  78.0 - 100.0 fL   MCH 29.0  26.0 - 34.0 pg   MCHC 33.5  30.0 - 36.0 g/dL   RDW 14.7  11.5 - 15.5 %   Platelets 201  150 - 400 K/uL  COMPREHENSIVE METABOLIC PANEL     Status: Abnormal   Collection Time    03/17/14  6:10 PM      Result Value Ref Range   Sodium 138  137 - 147 mEq/L   Potassium 4.1  3.7 - 5.3 mEq/L   Chloride 101  96 - 112 mEq/L   CO2 23  19 - 32 mEq/L   Glucose, Bld 90  70 - 99 mg/dL   BUN 7  6 - 23 mg/dL   Creatinine, Ser 0.71  0.50 - 1.10 mg/dL   Calcium 9.0  8.4 - 10.5 mg/dL   Total Protein 6.6  6.0 - 8.3 g/dL   Albumin 3.1 (*) 3.5 - 5.2 g/dL   AST 15  0 - 37 U/L   ALT 16  0 - 35 U/L   Alkaline Phosphatase 89  39 - 117 U/L   Total Bilirubin 0.3  0.3 - 1.2 mg/dL   GFR calc non Af Amer >90  >90 mL/min   GFR calc Af Amer >90  >90 mL/min   Anion gap 14  5 - 15  LACTATE DEHYDROGENASE     Status: None   Collection Time    03/17/14  6:10 PM      Result Value Ref Range   LDH 201  94 - 250 U/L  URIC ACID     Status: None   Collection Time    03/17/14  6:10 PM      Result Value Ref Range   Uric Acid, Serum 5.0  2.4 - 7.0 mg/dL    IMAGING No results found.  MAU COURSE Labs reviewed, normal  ASSESSMENT 1. Supervision of normal pregnancy in first trimester   -Given that BP  and HR elevation appear to be an isolated incident they appear to be associated with the episode of emesis.  There were no signs of target organ damage. 2. Unsymptomatic UTI -Patient denies any signs or symptoms of UTI but UA reveals leukocytes and bacteria.  PLAN Discharge home BP Diary- TID measurements Macrobid 100mg  BID for unsymptomatic UTI Follow-up Information   Follow up In 2 weeks.       Medication List         aspirin 81 MG tablet  Take 1 tablet (81 mg total) by mouth daily.      nitrofurantoin (macrocrystal-monohydrate) 100 MG capsule  Commonly known as:  MACROBID  Take 1 capsule (100 mg total) by mouth 2 (two) times daily.     PNV PO  Take by mouth.         Morley Kos, [place] 03/17/2014  7:52 PM   OB fellow attestation:  I have seen and examined this patient; I agree with above documentation in the PA student's note.   [name] is a 27 y.o. G3P1011 reporting nausea/vomiting after lunch, elevated blood pressure and tachycardia afterwards.  Since symptoms have resolved +FM, denies LOF, VB, contractions, vaginal discharge.  PE: BP 120/90  Pulse 109  Temp(Src) 98 F (36.7 C) (Oral)  Resp 18  Ht 5\' 9"  (1.753 m)  SpO2 98%  LMP 09/29/2013 Gen: calm comfortable, NAD Resp: normal effort, no distress Abd: gravid  ROS, labs, PMH reviewed  Plan: - preterm labor precautions - discussed gestational hypertension, no medications started at this time - continue routine follow up in OB clinic  Merla Riches, MD 10:00 PM

## 2014-03-17 NOTE — MAU Provider Note (Signed)
Attestation of Attending Supervision of Fellow: Evaluation and management procedures were performed by the Fellow under my supervision and collaboration.  I have reviewed the Fellow's note and chart, and I agree with the management and plan.    

## 2014-03-17 NOTE — Discharge Instructions (Signed)
Asymptomatic Bacteriuria Asymptomatic bacteriuria is the presence of a large number of bacteria in your urine without the usual symptoms of burning or frequent urination. The following conditions increase the risk of asymptomatic bacteriuria:  Diabetes mellitus.  Advanced age.  Pregnancy in the first trimester.  Kidney stones.  Kidney transplants.  Leaky kidney tube valve in young children (reflux). Treatment for this condition is not needed in most people and can lead to other problems such as too much yeast and growth of resistant bacteria. However, some people, such as pregnant women, do need treatment to prevent kidney infection. Asymptomatic bacteriuria in pregnancy is also associated with fetal growth restriction, premature labor, and newborn death. HOME CARE INSTRUCTIONS Monitor your condition for any changes. The following actions may help to relieve any discomfort you are feeling:  Drink enough water and fluids to keep your urine clear or pale yellow. Go to the bathroom more often to keep your bladder empty.  Keep the area around your vagina and rectum clean. Wipe yourself from front to back after urinating. SEEK IMMEDIATE MEDICAL CARE IF:  You develop signs of an infection such as:  Burning with urination.  Frequency of voiding.  Back pain.  Fever.  You have blood in the urine.  You develop a fever. MAKE SURE YOU:  Understand these instructions.  Will watch your condition.  Will get help right away if you are not doing well or get worse. Document Released: 07/24/2005 Document Revised: 12/08/2013 Document Reviewed: 01/13/2013 [employer] Patient Information 2015 [employer]. This information is not intended to replace advice given to you by your health care provider. Make sure you discuss any questions you have with your health care provider.  

## 2014-03-20 ENCOUNTER — Ambulatory Visit (HOSPITAL_COMMUNITY)
Admission: RE | Admit: 2014-03-20 | Discharge: 2014-03-20 | Disposition: A | Discharge status: Home / Self Care | Payer: BC Managed Care – PPO | Source: Ambulatory Visit | Attending: Obstetrics & Gynecology | Admitting: Obstetrics & Gynecology

## 2014-03-20 ENCOUNTER — Encounter (HOSPITAL_COMMUNITY): Payer: Self-pay

## 2014-03-20 VITALS — BP 144/99 | HR 96 | Wt 343.8 lb

## 2014-03-20 DIAGNOSIS — O09292 Supervision of pregnancy with other poor reproductive or obstetric history, second trimester: Secondary | ICD-10-CM

## 2014-03-20 DIAGNOSIS — E669 Obesity, unspecified: Secondary | ICD-10-CM | POA: Diagnosis not present

## 2014-03-20 DIAGNOSIS — Z1389 Encounter for screening for other disorder: Secondary | ICD-10-CM

## 2014-03-20 DIAGNOSIS — Z3491 Encounter for supervision of normal pregnancy, unspecified, first trimester: Secondary | ICD-10-CM

## 2014-03-20 DIAGNOSIS — Z363 Encounter for antenatal screening for malformations: Secondary | ICD-10-CM

## 2014-03-20 DIAGNOSIS — O09299 Supervision of pregnancy with other poor reproductive or obstetric history, unspecified trimester: Secondary | ICD-10-CM | POA: Insufficient documentation

## 2014-03-20 DIAGNOSIS — O99210 Obesity complicating pregnancy, unspecified trimester: Secondary | ICD-10-CM

## 2014-03-20 DIAGNOSIS — O34219 Maternal care for unspecified type scar from previous cesarean delivery: Secondary | ICD-10-CM

## 2014-04-10 ENCOUNTER — Other Ambulatory Visit: Payer: Self-pay | Admitting: Obstetrics and Gynecology

## 2014-04-10 DIAGNOSIS — O09299 Supervision of pregnancy with other poor reproductive or obstetric history, unspecified trimester: Secondary | ICD-10-CM

## 2014-04-17 ENCOUNTER — Other Ambulatory Visit (HOSPITAL_COMMUNITY): Payer: Self-pay | Admitting: Maternal and Fetal Medicine

## 2014-04-17 ENCOUNTER — Ambulatory Visit (HOSPITAL_COMMUNITY)
Admission: RE | Admit: 2014-04-17 | Discharge: 2014-04-17 | Disposition: A | Discharge status: Home / Self Care | Payer: BC Managed Care – PPO | Source: Ambulatory Visit | Attending: Obstetrics & Gynecology | Admitting: Obstetrics & Gynecology

## 2014-04-17 DIAGNOSIS — O09299 Supervision of pregnancy with other poor reproductive or obstetric history, unspecified trimester: Secondary | ICD-10-CM

## 2014-04-17 DIAGNOSIS — O99210 Obesity complicating pregnancy, unspecified trimester: Secondary | ICD-10-CM | POA: Diagnosis not present

## 2014-04-17 DIAGNOSIS — E669 Obesity, unspecified: Secondary | ICD-10-CM | POA: Insufficient documentation

## 2014-04-17 DIAGNOSIS — O358XX0 Maternal care for other (suspected) fetal abnormality and damage, not applicable or unspecified: Secondary | ICD-10-CM | POA: Diagnosis present

## 2014-04-20 ENCOUNTER — Telehealth: Payer: Self-pay | Admitting: *Deleted

## 2014-04-20 ENCOUNTER — Ambulatory Visit (INDEPENDENT_AMBULATORY_CARE_PROVIDER_SITE_OTHER): Payer: BC Managed Care – PPO | Admitting: Obstetrics & Gynecology

## 2014-04-20 VITALS — BP 165/105 | HR 104 | Wt 341.0 lb

## 2014-04-20 DIAGNOSIS — O34219 Maternal care for unspecified type scar from previous cesarean delivery: Secondary | ICD-10-CM

## 2014-04-20 DIAGNOSIS — Z3480 Encounter for supervision of other normal pregnancy, unspecified trimester: Secondary | ICD-10-CM

## 2014-04-20 DIAGNOSIS — Z3491 Encounter for supervision of normal pregnancy, unspecified, first trimester: Secondary | ICD-10-CM

## 2014-04-20 NOTE — Patient Instructions (Signed)
Vaginal Birth After Cesarean Delivery Vaginal birth after cesarean delivery (VBAC) is giving birth vaginally after previously delivering a baby by a cesarean. In the past, if a woman had a cesarean delivery, all births afterward would be done by cesarean delivery. This is no longer true. It can be safe for the mother to try a vaginal delivery after having a cesarean delivery.  It is important to discuss VBAC with your health care provider early in the pregnancy so you can understand the risks, benefits, and options. It will give you time to decide what is best in your particular case. The final decision about whether to have a VBAC or repeat cesarean delivery should be between you and your health care provider. Any changes in your health or your baby's health during your pregnancy may make it necessary to change your initial decision about VBAC.  WOMEN WHO PLAN TO HAVE A VBAC SHOULD CHECK WITH THEIR HEALTH CARE PROVIDER TO BE SURE THAT:  The previous cesarean delivery was done with a low transverse uterine cut (incision) (not a vertical classical incision).   The birth canal is big enough for the baby.   There were no other operations on the uterus.   An electronic fetal monitor (EFM) will be on at all times during labor.   An operating room will be available and ready in case an emergency cesarean delivery is needed.   A health care provider and surgical nursing staff will be available at all times during labor to be ready to do an emergency delivery cesarean if necessary.   An anesthesiologist will be present in case an emergency cesarean delivery is needed.   The nursery is prepared and has adequate personnel and necessary equipment available to care for the baby in case of an emergency cesarean delivery. BENEFITS OF VBAC  Shorter stay in the hospital.   Avoidance of risks associated with cesarean delivery, such as:  Surgical complications, such as opening of the incision or  hernia in the incision.  Injury to other organs.  Fever. This can occur if an infection develops after surgery. It can also occur as a reaction to the medicine given to make you numb during the surgery.  Less blood loss and need for blood transfusions.  Lower risk of blood clots and infection.  Shorter recovery.   Decreased risk for having to remove the uterus (hysterectomy).   Decreased risk for the placenta to completely or partially cover the opening of the uterus (placenta previa) with a future pregnancy.   Decrease risk in future labor and delivery. RISKS OF A VBAC  Tearing (rupture) of the uterus. This is occurs in less than 1% of VBACs. The risk of this happening is higher if:  Steps are taken to begin the labor process (induce labor) or stimulate or strengthen contractions (augment labor).   Medicine is used to soften (ripen) the cervix.  Having to remove the uterus (hysterectomy) if it ruptures. VBAC SHOULD NOT BE DONE IF:  The previous cesarean delivery was done with a vertical (classical) or T-shaped incision or you do not know what kind of incision was made.   You had a ruptured uterus.   You have had certain types of surgery on your uterus, such as removal of uterine fibroids. Ask your health care provider about other types of surgeries that prevent you from having a VBAC.  You have certain medical or childbirth (obstetrical) problems.   There are problems with the baby.   You  have had two previous cesarean deliveries and no vaginal deliveries. OTHER FACTS TO KNOW ABOUT VBAC:  It is safe to have an epidural anesthetic with VBAC.   It is safe to turn the baby from a breech position (attempt an external cephalic version).   It is safe to try a VBAC with twins.   VBAC may not be successful if your baby weights 8.8 lb (4 kg) or more. However, weight predictions are not always accurate and should not be used alone to decide if VBAC is right for  you.  There is an increased failure rate if the time between the cesarean delivery and VBAC is less than 19 months.   Your health care provider may advise against a VBAC if you have preeclampsia (high blood pressure, protein in the urine, and swelling of face and extremities).   VBAC is often successful if you previously gave birth vaginally.   VBAC is often successful when the labor starts spontaneously before the due date.   Delivering a baby through a VBAC is similar to having a normal spontaneous vaginal delivery. Document Released: 01/14/2007 Document Revised: 12/08/2013 Document Reviewed: 02/20/2013 [facility] Patient Information 2015 [place], [place]. This information is not intended to replace advice given to you by your health care provider. Make sure you discuss any questions you have with your health care provider.

## 2014-04-20 NOTE — Telephone Encounter (Signed)
LMOM for pt to rtn call about u's schedule

## 2014-04-20 NOTE — Progress Notes (Signed)
Pt with no comlaints Taking ASA Reviewed sono No contraindications seen to VBAC/TOLAC after review of chart and discussion with pt.  She will review paperwork and discus further and sign next visit

## 2014-04-20 NOTE — Telephone Encounter (Signed)
Message copied by Gretchen Short on Mon Apr 20, 2014  3:54 PM ------      Message from: Lavonia Drafts      Created: Mon Apr 20, 2014  9:30 AM       Please schedule pt f/u sono in 6 weeks.  Check growth.            Thx,      clh-S. ------

## 2014-04-20 NOTE — Progress Notes (Signed)
Manual BP recheck 138/78

## 2014-05-11 ENCOUNTER — Ambulatory Visit (INDEPENDENT_AMBULATORY_CARE_PROVIDER_SITE_OTHER): Payer: BC Managed Care – PPO | Admitting: Obstetrics & Gynecology

## 2014-05-11 VITALS — BP 144/103 | HR 96 | Wt 343.0 lb

## 2014-05-11 DIAGNOSIS — Z23 Encounter for immunization: Secondary | ICD-10-CM | POA: Diagnosis not present

## 2014-05-11 DIAGNOSIS — Z3483 Encounter for supervision of other normal pregnancy, third trimester: Secondary | ICD-10-CM | POA: Diagnosis not present

## 2014-05-11 DIAGNOSIS — Z3491 Encounter for supervision of normal pregnancy, unspecified, first trimester: Secondary | ICD-10-CM

## 2014-05-11 MED ORDER — LABETALOL HCL 100 MG PO TABS: 100.0000 mg | ORAL_TABLET | Freq: Two times a day (BID) | ORAL | Status: DC

## 2014-05-11 NOTE — Progress Notes (Signed)
BP recheck manual - 166/118

## 2014-05-11 NOTE — Progress Notes (Signed)
No sx Pre E, c/w CHTN and needs to be treated. Rx Labetalol 100 mg BID and ordered US growth f/u

## 2014-05-11 NOTE — Patient Instructions (Signed)

## 2014-05-12 ENCOUNTER — Telehealth: Payer: Self-pay | Admitting: *Deleted

## 2014-05-12 DIAGNOSIS — R7309 Other abnormal glucose: Secondary | ICD-10-CM

## 2014-05-12 LAB — HIV ANTIBODY (ROUTINE TESTING W REFLEX): HIV 1&2 Ab, 4th Generation: NONREACTIVE

## 2014-05-12 LAB — CBC
HEMATOCRIT: 33.1 % — AB (ref 36.0–46.0)
HEMOGLOBIN: 11.1 g/dL — AB (ref 12.0–15.0)
MCH: 28.4 pg (ref 26.0–34.0)
MCHC: 33.5 g/dL (ref 30.0–36.0)
MCV: 84.7 fL (ref 78.0–100.0)
Platelets: 233 10*3/uL (ref 150–400)
RBC: 3.91 MIL/uL (ref 3.87–5.11)
RDW: 14.6 % (ref 11.5–15.5)
WBC: 10.1 10*3/uL (ref 4.0–10.5)

## 2014-05-12 LAB — RPR

## 2014-05-12 LAB — GLUCOSE TOLERANCE, 1 HOUR (50G) W/O FASTING: Glucose, 1 Hour GTT: 170 mg/dL — ABNORMAL HIGH (ref 70–140)

## 2014-05-12 NOTE — Telephone Encounter (Signed)
Elevated 1HrGTT and needs 3HrGTT - can complete at [facility] lab - will send orders to [place] office for pt to pick up and take to lab. LMOM for pt to be sure she is fasting and to pick up lab orders at [facility] office.

## 2014-05-16 LAB — GLUCOSE TOLERANCE, 3 HOURS
GLUCOSE 3 HOUR GTT: 72 mg/dL (ref 70–144)
GLUCOSE, 1 HOUR-GESTATIONAL: 189 mg/dL (ref 70–189)
Glucose Tolerance, 2 hour: 172 mg/dL — ABNORMAL HIGH (ref 70–164)
Glucose Tolerance, Fasting: 88 mg/dL (ref 70–104)

## 2014-05-18 ENCOUNTER — Encounter (HOSPITAL_COMMUNITY): Payer: Self-pay

## 2014-05-18 ENCOUNTER — Ambulatory Visit (HOSPITAL_COMMUNITY)
Admission: RE | Admit: 2014-05-18 | Discharge: 2014-05-18 | Disposition: A | Discharge status: Home / Self Care | Payer: BC Managed Care – PPO | Source: Ambulatory Visit | Attending: Obstetrics & Gynecology | Admitting: Obstetrics & Gynecology

## 2014-05-18 ENCOUNTER — Ambulatory Visit (INDEPENDENT_AMBULATORY_CARE_PROVIDER_SITE_OTHER): Payer: BC Managed Care – PPO | Admitting: Obstetrics & Gynecology

## 2014-05-18 VITALS — BP 151/103 | HR 94 | Wt 343.0 lb

## 2014-05-18 VITALS — BP 147/88 | HR 89 | Wt 342.8 lb

## 2014-05-18 DIAGNOSIS — O99213 Obesity complicating pregnancy, third trimester: Secondary | ICD-10-CM | POA: Diagnosis not present

## 2014-05-18 DIAGNOSIS — Z3A28 28 weeks gestation of pregnancy: Secondary | ICD-10-CM | POA: Diagnosis not present

## 2014-05-18 DIAGNOSIS — Z3483 Encounter for supervision of other normal pregnancy, third trimester: Secondary | ICD-10-CM

## 2014-05-18 DIAGNOSIS — Z3481 Encounter for supervision of other normal pregnancy, first trimester: Secondary | ICD-10-CM | POA: Insufficient documentation

## 2014-05-18 DIAGNOSIS — Z3491 Encounter for supervision of normal pregnancy, unspecified, first trimester: Secondary | ICD-10-CM

## 2014-05-18 DIAGNOSIS — O10013 Pre-existing essential hypertension complicating pregnancy, third trimester: Secondary | ICD-10-CM | POA: Diagnosis present

## 2014-05-18 DIAGNOSIS — O10919 Unspecified pre-existing hypertension complicating pregnancy, unspecified trimester: Secondary | ICD-10-CM

## 2014-05-18 DIAGNOSIS — O24419 Gestational diabetes mellitus in pregnancy, unspecified control: Secondary | ICD-10-CM

## 2014-05-18 MED ORDER — LABETALOL HCL 200 MG PO TABS: 200.0000 mg | ORAL_TABLET | Freq: Two times a day (BID) | ORAL | Status: DC

## 2014-05-18 NOTE — Progress Notes (Signed)
Pt has abnormal 3HrGTT and chronic HTN

## 2014-05-18 NOTE — Patient Instructions (Signed)
Gestational Diabetes Mellitus Gestational diabetes mellitus, often simply referred to as gestational diabetes, is a type of diabetes that some women develop during pregnancy. In gestational diabetes, the pancreas does not make enough insulin (a hormone), the cells are less responsive to the insulin that is made (insulin resistance), or both.Normally, insulin moves sugars from food into the tissue cells. The tissue cells use the sugars for energy. The lack of insulin or the lack of normal response to insulin causes excess sugars to build up in the blood instead of going into the tissue cells. As a result, high blood sugar (hyperglycemia) develops. The effect of high sugar (glucose) levels can cause many problems.  RISK FACTORS You have an increased chance of developing gestational diabetes if you have a family history of diabetes and also have one or more of the following risk factors:  A body mass index over 30 (obesity).  A previous pregnancy with gestational diabetes.  An older age at the time of pregnancy. If blood glucose levels are kept in the normal range during pregnancy, women can have a healthy pregnancy. If your blood glucose levels are not well controlled, there may be risks to you, your unborn baby (fetus), your labor and delivery, or your newborn baby.  SYMPTOMS  If symptoms are experienced, they are much like symptoms you would normally expect during pregnancy. The symptoms of gestational diabetes include:   Increased thirst (polydipsia).  Increased urination (polyuria).  Increased urination during the night (nocturia).  Weight loss. This weight loss may be rapid.  Frequent, recurring infections.  Tiredness (fatigue).  Weakness.  Vision changes, such as blurred vision.  Fruity smell to your breath.  Abdominal pain. DIAGNOSIS Diabetes is diagnosed when blood glucose levels are increased. Your blood glucose level may be checked by one or more of the following blood  tests:  A fasting blood glucose test. You will not be allowed to eat for at least 8 hours before a blood sample is taken.  A random blood glucose test. Your blood glucose is checked at any time of the day regardless of when you ate.  A hemoglobin A1c blood glucose test. A hemoglobin A1c test provides information about blood glucose control over the previous 3 months.  An oral glucose tolerance test (OGTT). Your blood glucose is measured after you have not eaten (fasted) for 1-3 hours and then after you drink a glucose-containing beverage. Since the hormones that cause insulin resistance are highest at about 24-28 weeks of a pregnancy, an OGTT is usually performed during that time. If you have risk factors for gestational diabetes, your health care provider may test you for gestational diabetes earlier than 24 weeks of pregnancy. TREATMENT   You will need to take diabetes medicine or insulin daily to keep blood glucose levels in the desired range.  You will need to match insulin dosing with exercise and healthy food choices. The treatment goal is to maintain the before-meal (preprandial), bedtime, and overnight blood glucose level at 60-99 mg/dL during pregnancy. The treatment goal is to further maintain peak after-meal blood sugar (postprandial glucose) level at 100-140 mg/dL. HOME CARE INSTRUCTIONS   Have your hemoglobin A1c level checked twice a year.  Perform daily blood glucose monitoring as directed by your health care provider. It is common to perform frequent blood glucose monitoring.  Monitor urine ketones when you are ill and as directed by your health care provider.  Take your diabetes medicine and insulin as directed by your health care provider  to maintain your blood glucose level in the desired range.  Never run out of diabetes medicine or insulin. It is needed every day.  Adjust insulin based on your intake of carbohydrates. Carbohydrates can raise blood glucose levels but  need to be included in your diet. Carbohydrates provide vitamins, minerals, and fiber which are an essential part of a healthy diet. Carbohydrates are found in fruits, vegetables, whole grains, dairy products, legumes, and foods containing added sugars.  Eat healthy foods. Alternate 3 meals with 3 snacks.  Maintain a healthy weight gain. The usual total expected weight gain varies according to your prepregnancy body mass index (BMI).  Carry a medical alert card or wear your medical alert jewelry.  Carry a 15-gram carbohydrate snack with you at all times to treat low blood glucose (hypoglycemia). Some examples of 15-gram carbohydrate snacks include:  Glucose tablets, 3 or 4.  Glucose gel, 15-gram tube.  Raisins, 2 tablespoons (24 g).  Jelly beans, 6.  Animal crackers, 8.  Fruit juice, regular soda, or low-fat milk, 4 ounces (120 mL).  Gummy treats, 9.  Recognize hypoglycemia. Hypoglycemia during pregnancy occurs with blood glucose levels of 60 mg/dL and below. The risk for hypoglycemia increases when fasting or skipping meals, during or after intense exercise, and during sleep. Hypoglycemia symptoms can include:  Tremors or shakes.  Decreased ability to concentrate.  Sweating.  Increased heart rate.  Headache.  Dry mouth.  Hunger.  Irritability.  Anxiety.  Restless sleep.  Altered speech or coordination.  Confusion.  Treat hypoglycemia promptly. If you are alert and able to safely swallow, follow the 15:15 rule:  Take 15-20 grams of rapid-acting glucose or carbohydrate. Rapid-acting options include glucose gel, glucose tablets, or 4 ounces (120 mL) of fruit juice, regular soda, or low-fat milk.  Check your blood glucose level 15 minutes after taking the glucose.  Take 15-20 grams more of glucose if the repeat blood glucose level is still 70 mg/dL or below.  Eat a meal or snack within 1 hour once blood glucose levels return to normal.  Be alert to polyuria  (excess urination) and polydipsia (excess thirst) which are early signs of hyperglycemia. An early awareness of hyperglycemia allows for prompt treatment. Treat hyperglycemia as directed by your health care provider.  Engage in at least 30 minutes of physical activity a day or as directed by your health care provider. Ten minutes of physical activity timed 30 minutes after each meal is encouraged to control postprandial blood glucose levels.  Adjust your insulin dosing and food intake as needed if you start a new exercise or sport.  Follow your sick-day plan at any time you are unable to eat or drink as usual.  Avoid tobacco and alcohol use.  Keep all follow-up visits as directed by your health care provider.  Follow the advice of your health care provider regarding your prenatal and post-delivery (postpartum) appointments, meal planning, exercise, medicines, vitamins, blood tests, other medical tests, and physical activities.  Perform daily skin and foot care. Examine your skin and feet daily for cuts, bruises, redness, nail problems, bleeding, blisters, or sores.  Brush your teeth and gums at least twice a day and floss at least once a day. Follow up with your dentist regularly.  Schedule an eye exam during the first trimester of your pregnancy or as directed by your health care provider.  Share your diabetes management plan with your workplace or school.  Stay up-to-date with immunizations.  Learn to manage stress.    Obtain ongoing diabetes education and support as needed.  Learn about and consider breastfeeding your baby.  You should have your blood sugar level checked 6-12 weeks after delivery. This is done with an oral glucose tolerance test (OGTT). SEEK MEDICAL CARE IF:   You are unable to eat food or drink fluids for more than 6 hours.  You have nausea and vomiting for more than 6 hours.  You have a blood glucose level of 200 mg/dL and you have ketones in your  urine.  There is a change in mental status.  You develop vision problems.  You have a persistent headache.  You have upper abdominal pain or discomfort.  You develop an additional serious illness.  You have diarrhea for more than 6 hours.  You have been sick or have had a fever for a couple of days and are not getting better. SEEK IMMEDIATE MEDICAL CARE IF:   You have difficulty breathing.  You no longer feel the baby moving.  You are bleeding or have discharge from your vagina.  You start having premature contractions or labor. MAKE SURE YOU:  Understand these instructions.  Will watch your condition.  Will get help right away if you are not doing well or get worse. Document Released: 10/30/2000 Document Revised: 12/08/2013 Document Reviewed: 02/20/2012 [facility] Patient Information 2015 [place], [place]. This information is not intended to replace advice given to you by your health care provider. Make sure you discuss any questions you have with your health care provider.

## 2014-05-18 NOTE — Progress Notes (Signed)
Increase labetalol to 200 mg BID. Failed 3 hr GTT needs DM teaching start QID testing and diet. [place] images reviewed today

## 2014-05-20 ENCOUNTER — Encounter: Payer: BC Managed Care – PPO | Attending: Obstetrics & Gynecology

## 2014-05-20 ENCOUNTER — Telehealth: Payer: Self-pay | Admitting: Obstetrics & Gynecology

## 2014-05-20 VITALS — Ht 69.0 in | Wt 342.3 lb

## 2014-05-20 DIAGNOSIS — O24419 Gestational diabetes mellitus in pregnancy, unspecified control: Secondary | ICD-10-CM

## 2014-05-20 DIAGNOSIS — Z713 Dietary counseling and surveillance: Secondary | ICD-10-CM | POA: Diagnosis not present

## 2014-05-21 MED ORDER — GLUCOSE BLOOD VI STRP: ORAL_STRIP | Status: DC

## 2014-05-21 MED ORDER — ONETOUCH DELICA LANCETS FINE MISC: 1.0000 [IU] | Freq: Four times a day (QID) | Status: DC

## 2014-05-21 NOTE — Progress Notes (Signed)
  Patient was seen on 05/20/14 for Gestational Diabetes self-management . The following learning objectives were met by the patient :   States the definition of Gestational Diabetes  States why dietary management is important in controlling blood glucose  Describes the effects of carbohydrates on blood glucose levels  Demonstrates ability to create a balanced meal plan  Demonstrates carbohydrate counting   States when to check blood glucose levels  Demonstrates proper blood glucose monitoring techniques  States the effect of stress and exercise on blood glucose levels  States the importance of limiting caffeine and abstaining from alcohol and smoking  Plan:  Aim for 2 Carb Choices per meal (30 grams) +/- 1 either way for breakfast Aim for 3 Carb Choices per meal (45 grams) +/- 1 either way from lunch and dinner Aim for 1-2 Carbs per snack Begin reading food labels for Total Carbohydrate and sugar grams of foods Consider  increasing your activity level by walking daily as tolerated Begin checking BG before breakfast and 2 hours after first bit of breakfast, lunch and dinner after  as directed by MD  Take medication  as directed by MD  Blood glucose monitor given: One Touch Ultra 2 Lot # [id number] X Exp: 12/2014 Blood glucose reading: 110m/dl  Patient instructed to monitor glucose levels: FBS: 60 - <90 2 hour: <120  Patient received the following handouts:  Nutrition Diabetes and Pregnancy  Carbohydrate Counting List  Meal Planning worksheet  Patient will be seen for follow-up as needed.

## 2014-05-21 NOTE — Telephone Encounter (Signed)
Pt called in wanting Strips and Lancets called in to pharmacy for One Touch Ultra 2 Meter she was issued at [facility] - Sent supplies to pharmacy per pt request.

## 2014-05-29 ENCOUNTER — Ambulatory Visit (HOSPITAL_COMMUNITY): Payer: BC Managed Care – PPO

## 2014-05-29 ENCOUNTER — Other Ambulatory Visit (HOSPITAL_COMMUNITY): Payer: Self-pay | Admitting: Maternal and Fetal Medicine

## 2014-05-29 DIAGNOSIS — Z98891 History of uterine scar from previous surgery: Secondary | ICD-10-CM

## 2014-05-29 DIAGNOSIS — O09293 Supervision of pregnancy with other poor reproductive or obstetric history, third trimester: Secondary | ICD-10-CM

## 2014-05-29 DIAGNOSIS — O10913 Unspecified pre-existing hypertension complicating pregnancy, third trimester: Secondary | ICD-10-CM

## 2014-05-29 DIAGNOSIS — O99213 Obesity complicating pregnancy, third trimester: Secondary | ICD-10-CM

## 2014-05-29 DIAGNOSIS — O3429 Maternal care due to uterine scar from other previous surgery: Secondary | ICD-10-CM

## 2014-06-01 ENCOUNTER — Encounter: Payer: Self-pay | Admitting: Obstetrics & Gynecology

## 2014-06-01 ENCOUNTER — Ambulatory Visit (INDEPENDENT_AMBULATORY_CARE_PROVIDER_SITE_OTHER): Payer: BC Managed Care – PPO | Admitting: Obstetrics & Gynecology

## 2014-06-01 VITALS — BP 127/87 | HR 102 | Wt 338.0 lb

## 2014-06-01 DIAGNOSIS — Z3483 Encounter for supervision of other normal pregnancy, third trimester: Secondary | ICD-10-CM

## 2014-06-01 DIAGNOSIS — O24913 Unspecified diabetes mellitus in pregnancy, third trimester: Secondary | ICD-10-CM | POA: Diagnosis not present

## 2014-06-01 NOTE — Patient Instructions (Signed)
Vaginal Birth After Cesarean Delivery Vaginal birth after cesarean delivery (VBAC) is giving birth vaginally after previously delivering a baby by a cesarean. In the past, if a woman had a cesarean delivery, all births afterward would be done by cesarean delivery. This is no longer true. It can be safe for the mother to try a vaginal delivery after having a cesarean delivery.  It is important to discuss VBAC with your health care provider early in the pregnancy so you can understand the risks, benefits, and options. It will give you time to decide what is best in your particular case. The final decision about whether to have a VBAC or repeat cesarean delivery should be between you and your health care provider. Any changes in your health or your baby's health during your pregnancy may make it necessary to change your initial decision about VBAC.  WOMEN WHO PLAN TO HAVE A VBAC SHOULD CHECK WITH THEIR HEALTH CARE PROVIDER TO BE SURE THAT:  The previous cesarean delivery was done with a low transverse uterine cut (incision) (not a vertical classical incision).   The birth canal is big enough for the baby.   There were no other operations on the uterus.   An electronic fetal monitor (EFM) will be on at all times during labor.   An operating room will be available and ready in case an emergency cesarean delivery is needed.   A health care provider and surgical nursing staff will be available at all times during labor to be ready to do an emergency delivery cesarean if necessary.   An anesthesiologist will be present in case an emergency cesarean delivery is needed.   The nursery is prepared and has adequate personnel and necessary equipment available to care for the baby in case of an emergency cesarean delivery. BENEFITS OF VBAC  Shorter stay in the hospital.   Avoidance of risks associated with cesarean delivery, such as:  Surgical complications, such as opening of the incision or  hernia in the incision.  Injury to other organs.  Fever. This can occur if an infection develops after surgery. It can also occur as a reaction to the medicine given to make you numb during the surgery.  Less blood loss and need for blood transfusions.  Lower risk of blood clots and infection.  Shorter recovery.   Decreased risk for having to remove the uterus (hysterectomy).   Decreased risk for the placenta to completely or partially cover the opening of the uterus (placenta previa) with a future pregnancy.   Decrease risk in future labor and delivery. RISKS OF A VBAC  Tearing (rupture) of the uterus. This is occurs in less than 1% of VBACs. The risk of this happening is higher if:  Steps are taken to begin the labor process (induce labor) or stimulate or strengthen contractions (augment labor).   Medicine is used to soften (ripen) the cervix.  Having to remove the uterus (hysterectomy) if it ruptures. VBAC SHOULD NOT BE DONE IF:  The previous cesarean delivery was done with a vertical (classical) or T-shaped incision or you do not know what kind of incision was made.   You had a ruptured uterus.   You have had certain types of surgery on your uterus, such as removal of uterine fibroids. Ask your health care provider about other types of surgeries that prevent you from having a VBAC.  You have certain medical or childbirth (obstetrical) problems.   There are problems with the baby.   You  have had two previous cesarean deliveries and no vaginal deliveries. OTHER FACTS TO KNOW ABOUT VBAC:  It is safe to have an epidural anesthetic with VBAC.   It is safe to turn the baby from a breech position (attempt an external cephalic version).   It is safe to try a VBAC with twins.   VBAC may not be successful if your baby weights 8.8 lb (4 kg) or more. However, weight predictions are not always accurate and should not be used alone to decide if VBAC is right for  you.  There is an increased failure rate if the time between the cesarean delivery and VBAC is less than 19 months.   Your health care provider may advise against a VBAC if you have preeclampsia (high blood pressure, protein in the urine, and swelling of face and extremities).   VBAC is often successful if you previously gave birth vaginally.   VBAC is often successful when the labor starts spontaneously before the due date.   Delivering a baby through a VBAC is similar to having a normal spontaneous vaginal delivery. Document Released: 01/14/2007 Document Revised: 12/08/2013 Document Reviewed: 02/20/2013 [facility] Patient Information 2015 [place], [place]. This information is not intended to replace advice given to you by your health care provider. Make sure you discuss any questions you have with your health care provider.

## 2014-06-01 NOTE — Progress Notes (Signed)
Pt with viral URI.  Rec OTC meds.  No DM in cough syrup [facility] log reviewed.  After the first week, the glc was very good as pt gave up soda, [facility] levels 80-140  Encouraged diet changes and exercise F/u 2 weeks

## 2014-06-01 NOTE — Addendum Note (Signed)
Addended by: Gretchen Short on: 06/01/2014 09:58 AM   Modules accepted: Orders

## 2014-06-01 NOTE — Progress Notes (Signed)
Pt has been keeping record for blood glucose levels. Date 05/20/14 - 06/01/14 CBG range: fasting 79 through 188. The 188 reading was on 05/20/14 after "half glass of Mt Dew" but has now stopped all Mt. Dew and tried to make better food/drink choices.  Urine dip shows large ketones, large blood, mod leuks. Will send urine for culture.  Pt states she has had sinus congestion "green mucous" and cough times 6 days. Pt would like medication for sinus congestion and cough if possible.  Pt BP much better today and pt down 5 lbs!

## 2014-06-04 LAB — CULTURE, URINE COMPREHENSIVE

## 2014-06-08 ENCOUNTER — Encounter: Payer: Self-pay | Admitting: Obstetrics & Gynecology

## 2014-06-15 ENCOUNTER — Ambulatory Visit (INDEPENDENT_AMBULATORY_CARE_PROVIDER_SITE_OTHER): Payer: BC Managed Care – PPO | Admitting: Obstetrics & Gynecology

## 2014-06-15 VITALS — BP 136/95 | HR 103 | Wt 340.0 lb

## 2014-06-15 DIAGNOSIS — Z3403 Encounter for supervision of normal first pregnancy, third trimester: Secondary | ICD-10-CM

## 2014-06-15 NOTE — Patient Instructions (Signed)
Third Trimester of Pregnancy The third trimester is from week 29 through week 42, months 7 through 9. The third trimester is a time when the fetus is growing rapidly. At the end of the ninth month, the fetus is about 20 inches in length and weighs 6-10 pounds.  BODY CHANGES Your body goes through many changes during pregnancy. The changes vary from woman to woman.   Your weight will continue to increase. You can expect to gain 25-35 pounds (11-16 kg) by the end of the pregnancy.  You may begin to get stretch marks on your hips, abdomen, and breasts.  You may urinate more often because the fetus is moving lower into your pelvis and pressing on your bladder.  You may develop or continue to have heartburn as a result of your pregnancy.  You may develop constipation because certain hormones are causing the muscles that push waste through your intestines to slow down.  You may develop hemorrhoids or swollen, bulging veins (varicose veins).  You may have pelvic pain because of the weight gain and pregnancy hormones relaxing your joints between the bones in your pelvis. Backaches may result from overexertion of the muscles supporting your posture.  You may have changes in your hair. These can include thickening of your hair, rapid growth, and changes in texture. Some women also have hair loss during or after pregnancy, or hair that feels dry or thin. Your hair will most likely return to normal after your baby is born.  Your breasts will continue to grow and be tender. A yellow discharge may leak from your breasts called colostrum.  Your belly button may stick out.  You may feel short of breath because of your expanding uterus.  You may notice the fetus "dropping," or moving lower in your abdomen.  You may have a bloody mucus discharge. This usually occurs a few days to a week before labor begins.  Your cervix becomes thin and soft (effaced) near your due date. WHAT TO EXPECT AT YOUR PRENATAL  EXAMS  You will have prenatal exams every 2 weeks until week 36. Then, you will have weekly prenatal exams. During a routine prenatal visit:  You will be weighed to make sure you and the fetus are growing normally.  Your blood pressure is taken.  Your abdomen will be measured to track your baby's growth.  The fetal heartbeat will be listened to.  Any test results from the previous visit will be discussed.  You may have a cervical check near your due date to see if you have effaced. At around 36 weeks, your caregiver will check your cervix. At the same time, your caregiver will also perform a test on the secretions of the vaginal tissue. This test is to determine if a type of bacteria, Group B streptococcus, is present. Your caregiver will explain this further. Your caregiver may ask you:  What your birth plan is.  How you are feeling.  If you are feeling the baby move.  If you have had any abnormal symptoms, such as leaking fluid, bleeding, severe headaches, or abdominal cramping.  If you have any questions. Other tests or screenings that may be performed during your third trimester include:  Blood tests that check for low iron levels (anemia).  Fetal testing to check the health, activity level, and growth of the fetus. Testing is done if you have certain medical conditions or if there are problems during the pregnancy. FALSE LABOR You may feel small, irregular contractions that   eventually go away. These are called Braxton Hicks contractions, or false labor. Contractions may last for hours, days, or even weeks before true labor sets in. If contractions come at regular intervals, intensify, or become painful, it is best to be seen by your caregiver.  SIGNS OF LABOR   Menstrual-like cramps.  Contractions that are 5 minutes apart or less.  Contractions that start on the top of the uterus and spread down to the lower abdomen and back.  A sense of increased pelvic pressure or back  pain.  A watery or bloody mucus discharge that comes from the vagina. If you have any of these signs before the 37th week of pregnancy, call your caregiver right away. You need to go to the hospital to get checked immediately. HOME CARE INSTRUCTIONS   Avoid all smoking, herbs, alcohol, and unprescribed drugs. These chemicals affect the formation and growth of the baby.  Follow your caregiver's instructions regarding medicine use. There are medicines that are either safe or unsafe to take during pregnancy.  Exercise only as directed by your caregiver. Experiencing uterine cramps is a good sign to stop exercising.  Continue to eat regular, healthy meals.  Wear a good support bra for breast tenderness.  Do not use hot tubs, steam rooms, or saunas.  Wear your seat belt at all times when driving.  Avoid raw meat, uncooked cheese, cat litter boxes, and soil used by cats. These carry germs that can cause birth defects in the baby.  Take your prenatal vitamins.  Try taking a stool softener (if your caregiver approves) if you develop constipation. Eat more high-fiber foods, such as fresh vegetables or fruit and whole grains. Drink plenty of fluids to keep your urine clear or pale yellow.  Take warm sitz baths to soothe any pain or discomfort caused by hemorrhoids. Use hemorrhoid cream if your caregiver approves.  If you develop varicose veins, wear support hose. Elevate your feet for 15 minutes, 3-4 times a day. Limit salt in your diet.  Avoid heavy lifting, wear low heal shoes, and practice good posture.  Rest a lot with your legs elevated if you have leg cramps or low back pain.  Visit your dentist if you have not gone during your pregnancy. Use a soft toothbrush to brush your teeth and be gentle when you floss.  A sexual relationship may be continued unless your caregiver directs you otherwise.  Do not travel far distances unless it is absolutely necessary and only with the approval  of your caregiver.  Take prenatal classes to understand, practice, and ask questions about the labor and delivery.  Make a trial run to the hospital.  Pack your hospital bag.  Prepare the baby's nursery.  Continue to go to all your prenatal visits as directed by your caregiver. SEEK MEDICAL CARE IF:  You are unsure if you are in labor or if your water has broken.  You have dizziness.  You have mild pelvic cramps, pelvic pressure, or nagging pain in your abdominal area.  You have persistent nausea, vomiting, or diarrhea.  You have a bad smelling vaginal discharge.  You have pain with urination. SEEK IMMEDIATE MEDICAL CARE IF:   You have a fever.  You are leaking fluid from your vagina.  You have spotting or bleeding from your vagina.  You have severe abdominal cramping or pain.  You have rapid weight loss or gain.  You have shortness of breath with chest pain.  You notice sudden or extreme swelling   of your face, hands, ankles, feet, or legs.  You have not felt your baby move in over an hour.  You have severe headaches that do not go away with medicine.  You have vision changes. Document Released: 07/18/2001 Document Revised: 07/29/2013 Document Reviewed: 09/24/2012 [employer] Patient Information 2015 [employer]. This information is not intended to replace advice given to you by your health care provider. Make sure you discuss any questions you have with your health care provider.  

## 2014-06-15 NOTE — Progress Notes (Signed)
Fasting most <94 but up to 109, ppp most< 120 highest 130 continue diet control

## 2014-06-15 NOTE — Progress Notes (Signed)
Left lower extremity edema more than right side. Pt not having any UTI Sx but will take abx prescribed last week to prevent a more severe UTI from coming on.

## 2014-06-17 ENCOUNTER — Other Ambulatory Visit (HOSPITAL_COMMUNITY): Payer: Self-pay | Admitting: Maternal and Fetal Medicine

## 2014-06-17 ENCOUNTER — Ambulatory Visit (HOSPITAL_COMMUNITY)
Admission: RE | Admit: 2014-06-17 | Discharge: 2014-06-17 | Disposition: A | Discharge status: Home / Self Care | Payer: BC Managed Care – PPO | Source: Ambulatory Visit | Attending: Family Medicine | Admitting: Family Medicine

## 2014-06-17 ENCOUNTER — Ambulatory Visit (HOSPITAL_COMMUNITY)
Admission: RE | Admit: 2014-06-17 | Discharge: 2014-06-17 | Disposition: A | Discharge status: Home / Self Care | Payer: BC Managed Care – PPO | Source: Ambulatory Visit | Attending: Obstetrics & Gynecology | Admitting: Obstetrics & Gynecology

## 2014-06-17 DIAGNOSIS — O09293 Supervision of pregnancy with other poor reproductive or obstetric history, third trimester: Secondary | ICD-10-CM | POA: Insufficient documentation

## 2014-06-17 DIAGNOSIS — O99213 Obesity complicating pregnancy, third trimester: Secondary | ICD-10-CM

## 2014-06-17 DIAGNOSIS — O3421 Maternal care for scar from previous cesarean delivery: Secondary | ICD-10-CM | POA: Insufficient documentation

## 2014-06-17 DIAGNOSIS — Z3A32 32 weeks gestation of pregnancy: Secondary | ICD-10-CM | POA: Insufficient documentation

## 2014-06-17 DIAGNOSIS — Z98891 History of uterine scar from previous surgery: Secondary | ICD-10-CM

## 2014-06-17 DIAGNOSIS — O3429 Maternal care due to uterine scar from other previous surgery: Secondary | ICD-10-CM

## 2014-06-17 DIAGNOSIS — O24410 Gestational diabetes mellitus in pregnancy, diet controlled: Secondary | ICD-10-CM | POA: Diagnosis not present

## 2014-06-17 DIAGNOSIS — O24419 Gestational diabetes mellitus in pregnancy, unspecified control: Secondary | ICD-10-CM

## 2014-06-17 DIAGNOSIS — O10913 Unspecified pre-existing hypertension complicating pregnancy, third trimester: Secondary | ICD-10-CM

## 2014-06-17 DIAGNOSIS — O10013 Pre-existing essential hypertension complicating pregnancy, third trimester: Secondary | ICD-10-CM | POA: Diagnosis not present

## 2014-06-18 ENCOUNTER — Telehealth: Payer: Self-pay | Admitting: Obstetrics & Gynecology

## 2014-06-18 NOTE — Telephone Encounter (Signed)
Pt will do NST only on Monday's in [place] and will do NST/AFI at [facility] on Thursdays.

## 2014-06-23 ENCOUNTER — Encounter (HOSPITAL_COMMUNITY): Payer: Self-pay

## 2014-06-23 ENCOUNTER — Other Ambulatory Visit (HOSPITAL_COMMUNITY): Payer: Self-pay | Admitting: Maternal and Fetal Medicine

## 2014-06-23 ENCOUNTER — Ambulatory Visit (HOSPITAL_COMMUNITY)
Admission: RE | Admit: 2014-06-23 | Discharge: 2014-06-23 | Disposition: A | Discharge status: Home / Self Care | Payer: BC Managed Care – PPO | Source: Ambulatory Visit | Attending: Obstetrics & Gynecology | Admitting: Obstetrics & Gynecology

## 2014-06-23 DIAGNOSIS — O10913 Unspecified pre-existing hypertension complicating pregnancy, third trimester: Secondary | ICD-10-CM

## 2014-06-23 DIAGNOSIS — O10013 Pre-existing essential hypertension complicating pregnancy, third trimester: Secondary | ICD-10-CM | POA: Insufficient documentation

## 2014-06-23 DIAGNOSIS — O3421 Maternal care for scar from previous cesarean delivery: Secondary | ICD-10-CM | POA: Diagnosis present

## 2014-06-23 DIAGNOSIS — O99213 Obesity complicating pregnancy, third trimester: Secondary | ICD-10-CM

## 2014-06-23 DIAGNOSIS — O09299 Supervision of pregnancy with other poor reproductive or obstetric history, unspecified trimester: Secondary | ICD-10-CM | POA: Diagnosis not present

## 2014-06-23 DIAGNOSIS — Z3A35 35 weeks gestation of pregnancy: Secondary | ICD-10-CM | POA: Diagnosis not present

## 2014-06-23 DIAGNOSIS — O10919 Unspecified pre-existing hypertension complicating pregnancy, unspecified trimester: Secondary | ICD-10-CM | POA: Insufficient documentation

## 2014-06-23 DIAGNOSIS — Z3A33 33 weeks gestation of pregnancy: Secondary | ICD-10-CM | POA: Insufficient documentation

## 2014-06-23 DIAGNOSIS — O24410 Gestational diabetes mellitus in pregnancy, diet controlled: Secondary | ICD-10-CM | POA: Insufficient documentation

## 2014-06-23 DIAGNOSIS — O3429 Maternal care due to uterine scar from other previous surgery: Secondary | ICD-10-CM | POA: Diagnosis not present

## 2014-06-23 DIAGNOSIS — O09293 Supervision of pregnancy with other poor reproductive or obstetric history, third trimester: Secondary | ICD-10-CM

## 2014-06-23 DIAGNOSIS — O99210 Obesity complicating pregnancy, unspecified trimester: Secondary | ICD-10-CM | POA: Diagnosis present

## 2014-06-23 DIAGNOSIS — Z98891 History of uterine scar from previous surgery: Secondary | ICD-10-CM

## 2014-06-26 ENCOUNTER — Ambulatory Visit (HOSPITAL_COMMUNITY)
Admission: RE | Admit: 2014-06-26 | Discharge: 2014-06-26 | Disposition: A | Discharge status: Home / Self Care | Payer: BC Managed Care – PPO | Source: Ambulatory Visit | Attending: Obstetrics & Gynecology | Admitting: Obstetrics & Gynecology

## 2014-06-26 ENCOUNTER — Encounter (HOSPITAL_COMMUNITY): Payer: Self-pay

## 2014-06-26 DIAGNOSIS — O99213 Obesity complicating pregnancy, third trimester: Secondary | ICD-10-CM

## 2014-06-26 DIAGNOSIS — O24410 Gestational diabetes mellitus in pregnancy, diet controlled: Secondary | ICD-10-CM | POA: Insufficient documentation

## 2014-06-26 DIAGNOSIS — Z98891 History of uterine scar from previous surgery: Secondary | ICD-10-CM

## 2014-06-26 DIAGNOSIS — O10013 Pre-existing essential hypertension complicating pregnancy, third trimester: Secondary | ICD-10-CM | POA: Insufficient documentation

## 2014-06-26 DIAGNOSIS — O3421 Maternal care for scar from previous cesarean delivery: Secondary | ICD-10-CM | POA: Insufficient documentation

## 2014-06-26 DIAGNOSIS — O09293 Supervision of pregnancy with other poor reproductive or obstetric history, third trimester: Secondary | ICD-10-CM | POA: Diagnosis not present

## 2014-06-26 DIAGNOSIS — O10919 Unspecified pre-existing hypertension complicating pregnancy, unspecified trimester: Secondary | ICD-10-CM | POA: Insufficient documentation

## 2014-06-26 DIAGNOSIS — O10913 Unspecified pre-existing hypertension complicating pregnancy, third trimester: Secondary | ICD-10-CM

## 2014-06-26 DIAGNOSIS — O99210 Obesity complicating pregnancy, unspecified trimester: Secondary | ICD-10-CM | POA: Insufficient documentation

## 2014-06-26 DIAGNOSIS — Z3A34 34 weeks gestation of pregnancy: Secondary | ICD-10-CM | POA: Diagnosis not present

## 2014-06-26 DIAGNOSIS — O3429 Maternal care due to uterine scar from other previous surgery: Secondary | ICD-10-CM

## 2014-06-29 ENCOUNTER — Ambulatory Visit (INDEPENDENT_AMBULATORY_CARE_PROVIDER_SITE_OTHER): Payer: BC Managed Care – PPO | Admitting: Obstetrics & Gynecology

## 2014-06-29 VITALS — BP 134/96 | HR 93 | Wt 344.0 lb

## 2014-06-29 DIAGNOSIS — O10913 Unspecified pre-existing hypertension complicating pregnancy, third trimester: Secondary | ICD-10-CM | POA: Diagnosis not present

## 2014-06-29 DIAGNOSIS — I10 Essential (primary) hypertension: Secondary | ICD-10-CM

## 2014-06-29 DIAGNOSIS — Z3483 Encounter for supervision of other normal pregnancy, third trimester: Secondary | ICD-10-CM

## 2014-06-29 DIAGNOSIS — O133 Gestational [pregnancy-induced] hypertension without significant proteinuria, third trimester: Secondary | ICD-10-CM

## 2014-06-29 NOTE — Progress Notes (Signed)
FBS<85 and PP <129, NST and AFI on 11/20 was normal   Growth [place] 2-3 weeks  NST today reactive

## 2014-06-29 NOTE — Patient Instructions (Signed)

## 2014-06-30 ENCOUNTER — Encounter: Payer: Self-pay | Admitting: *Deleted

## 2014-06-30 ENCOUNTER — Other Ambulatory Visit (HOSPITAL_COMMUNITY): Payer: BC Managed Care – PPO

## 2014-07-03 ENCOUNTER — Inpatient Hospital Stay (HOSPITAL_COMMUNITY)
Admission: AD | Admit: 2014-07-03 | Discharge: 2014-07-03 | Disposition: A | Discharge status: Home / Self Care | Payer: BC Managed Care – PPO | Source: Ambulatory Visit | Attending: Obstetrics & Gynecology | Admitting: Obstetrics & Gynecology

## 2014-07-03 ENCOUNTER — Encounter (HOSPITAL_COMMUNITY): Payer: Self-pay

## 2014-07-03 ENCOUNTER — Other Ambulatory Visit (HOSPITAL_COMMUNITY): Payer: Self-pay | Admitting: Maternal and Fetal Medicine

## 2014-07-03 ENCOUNTER — Encounter (HOSPITAL_COMMUNITY): Payer: Self-pay | Admitting: *Deleted

## 2014-07-03 ENCOUNTER — Ambulatory Visit (HOSPITAL_COMMUNITY)
Admission: RE | Admit: 2014-07-03 | Discharge: 2014-07-03 | Disposition: A | Discharge status: Home / Self Care | Payer: BC Managed Care – PPO | Source: Ambulatory Visit | Attending: Obstetrics & Gynecology | Admitting: Obstetrics & Gynecology

## 2014-07-03 DIAGNOSIS — O09293 Supervision of pregnancy with other poor reproductive or obstetric history, third trimester: Secondary | ICD-10-CM

## 2014-07-03 DIAGNOSIS — Z3A35 35 weeks gestation of pregnancy: Secondary | ICD-10-CM | POA: Insufficient documentation

## 2014-07-03 DIAGNOSIS — Z98891 History of uterine scar from previous surgery: Secondary | ICD-10-CM

## 2014-07-03 DIAGNOSIS — O133 Gestational [pregnancy-induced] hypertension without significant proteinuria, third trimester: Secondary | ICD-10-CM

## 2014-07-03 DIAGNOSIS — O99213 Obesity complicating pregnancy, third trimester: Secondary | ICD-10-CM

## 2014-07-03 DIAGNOSIS — O10913 Unspecified pre-existing hypertension complicating pregnancy, third trimester: Secondary | ICD-10-CM

## 2014-07-03 DIAGNOSIS — O3421 Maternal care for scar from previous cesarean delivery: Secondary | ICD-10-CM | POA: Diagnosis present

## 2014-07-03 DIAGNOSIS — O26893 Other specified pregnancy related conditions, third trimester: Secondary | ICD-10-CM | POA: Insufficient documentation

## 2014-07-03 DIAGNOSIS — O09299 Supervision of pregnancy with other poor reproductive or obstetric history, unspecified trimester: Secondary | ICD-10-CM | POA: Insufficient documentation

## 2014-07-03 DIAGNOSIS — O10013 Pre-existing essential hypertension complicating pregnancy, third trimester: Secondary | ICD-10-CM | POA: Diagnosis present

## 2014-07-03 DIAGNOSIS — R030 Elevated blood-pressure reading, without diagnosis of hypertension: Secondary | ICD-10-CM | POA: Diagnosis not present

## 2014-07-03 DIAGNOSIS — O24410 Gestational diabetes mellitus in pregnancy, diet controlled: Secondary | ICD-10-CM | POA: Insufficient documentation

## 2014-07-03 DIAGNOSIS — O3429 Maternal care due to uterine scar from other previous surgery: Secondary | ICD-10-CM | POA: Diagnosis not present

## 2014-07-03 DIAGNOSIS — O99210 Obesity complicating pregnancy, unspecified trimester: Secondary | ICD-10-CM | POA: Insufficient documentation

## 2014-07-03 HISTORY — DX: Gestational diabetes mellitus in pregnancy, unspecified control: O24.419

## 2014-07-03 LAB — COMPREHENSIVE METABOLIC PANEL
ALBUMIN: 2.4 g/dL — AB (ref 3.5–5.2)
ALK PHOS: 149 U/L — AB (ref 39–117)
ALT: 12 U/L (ref 0–35)
AST: 16 U/L (ref 0–37)
Anion gap: 12 (ref 5–15)
BUN: 12 mg/dL (ref 6–23)
CHLORIDE: 104 meq/L (ref 96–112)
CO2: 20 meq/L (ref 19–32)
CREATININE: 0.79 mg/dL (ref 0.50–1.10)
Calcium: 8.5 mg/dL (ref 8.4–10.5)
GFR calc Af Amer: >90 mL/min (ref >90)
Glucose, Bld: 91 mg/dL (ref 70–99)
POTASSIUM: 4.3 meq/L (ref 3.7–5.3)
Sodium: 136 mEq/L — ABNORMAL LOW (ref 137–147)
Total Protein: 5.8 g/dL — ABNORMAL LOW (ref 6.0–8.3)

## 2014-07-03 LAB — CBC WITH DIFFERENTIAL/PLATELET
BASOS PCT: 0 % (ref 0–1)
Basophils Absolute: 0 10*3/uL (ref 0.0–0.1)
Eosinophils Absolute: 0.1 10*3/uL (ref 0.0–0.7)
Eosinophils Relative: 1 % (ref 0–5)
HEMATOCRIT: 31.8 % — AB (ref 36.0–46.0)
Hemoglobin: 10.6 g/dL — ABNORMAL LOW (ref 12.0–15.0)
LYMPHS PCT: 17 % (ref 12–46)
Lymphs Abs: 1.8 10*3/uL (ref 0.7–4.0)
MCH: 29.2 pg (ref 26.0–34.0)
MCHC: 33.3 g/dL (ref 30.0–36.0)
MCV: 87.6 fL (ref 78.0–100.0)
MONO ABS: 1.1 10*3/uL — AB (ref 0.1–1.0)
Monocytes Relative: 11 % (ref 3–12)
NEUTROS ABS: 7.3 10*3/uL (ref 1.7–7.7)
NEUTROS PCT: 71 % (ref 43–77)
Platelets: 202 10*3/uL (ref 150–400)
RBC: 3.63 MIL/uL — ABNORMAL LOW (ref 3.87–5.11)
RDW: 14.4 % (ref 11.5–15.5)
WBC: 10.2 10*3/uL (ref 4.0–10.5)

## 2014-07-03 LAB — URINALYSIS, ROUTINE W REFLEX MICROSCOPIC
Bilirubin Urine: NEGATIVE
GLUCOSE, UA: NEGATIVE mg/dL
Hgb urine dipstick: NEGATIVE
Ketones, ur: NEGATIVE mg/dL
LEUKOCYTES UA: NEGATIVE
NITRITE: NEGATIVE
PH: 6.5 (ref 5.0–8.0)
Protein, ur: NEGATIVE mg/dL
Specific Gravity, Urine: 1.02 (ref 1.005–1.030)
Urobilinogen, UA: 0.2 mg/dL (ref 0.0–1.0)

## 2014-07-03 LAB — PROTEIN / CREATININE RATIO, URINE
Creatinine, Urine: 127.72 mg/dL
Protein Creatinine Ratio: 0.1 (ref 0.00–0.15)
Total Protein, Urine: 12.4 mg/dL

## 2014-07-03 NOTE — MAU Provider Note (Signed)
History     CSN: [id number]  Arrival date and time: 07/03/14 [id number]   First Provider Initiated Contact with Patient 07/03/14 1030      Chief Complaint  Patient presents with  . Hypertension   HPI  Patient is 28 y.o. [id number] [redacted]w[redacted]d here with complaints of elevated BP.  Patient was seen at [place] clinic earlier today for pre-scheduled NST.  Concerned about elevated BP (154/90), so sent to MAU.  BPP 8/8.  Patient denies any HA, blurred vision, abd pain.  +FM, denies LOF, VB, contractions, vaginal discharge.    Past Medical History  Diagnosis Date  . Granuloma, skin     incisional pfannenstiel scar  . Anemia   . Preterm labor   . Hypertension   . Gestational diabetes mellitus, antepartum   . Gestational diabetes     Past Surgical History  Procedure Laterality Date  . Cesarean section    . Nasal sinus surgery  2007  . Tonsillectomy  2008  . Endometrial fulguration  07/07/11  . Abdominal surgery      TUMMY TUCK EXCISED ENDOMETRIOSIS    Family History  Problem Relation Age of Onset  . Diabetes Mother   . Hypertension Mother   . Heart disease Father   . Cancer Paternal Uncle   . Cancer Paternal Grandmother     History  Substance Use Topics  . Smoking status: Never Smoker   . Smokeless tobacco: Never Used  . Alcohol Use: No    Allergies:  Allergies  Allergen Reactions  . Adhesive [Tape] Other (See Comments)    Causes blistering of skin  . Promethazine Hcl Nausea And Vomiting  . Vicodin [Hydrocodone-Acetaminophen] Nausea And Vomiting    Prescriptions prior to admission  Medication Sig Dispense Refill Last Dose  . aspirin 81 MG tablet Take 1 tablet (81 mg total) by mouth daily. 30 tablet 6 07/03/2014 at Unknown time  . labetalol (NORMODYNE) 200 MG tablet Take 1 tablet (200 mg total) by mouth 2 (two) times daily. 60 tablet 3 07/03/2014 at Unknown time  . Prenatal Vit w/Fe-Methylfol-FA (PNV PO) Take 1 tablet by mouth daily.    07/03/2014 at Unknown time  .  glucose blood test strip Test Glucose 4 times daily with One Touch Ultra 2 Meter 120 each 12 Taking  . ONETOUCH DELICA LANCETS FINE MISC 1 Units by Does not apply route 4 (four) times daily. 120 each 12 Taking    Review of Systems  Constitutional: Negative for fever and chills.  Eyes: Negative for blurred vision and double vision.  Respiratory: Negative for cough and shortness of breath.   Cardiovascular: Negative for chest pain and leg swelling.  Gastrointestinal: Negative for nausea and vomiting.  Neurological: Negative for headaches.   Physical Exam   Blood pressure 139/86, pulse 91, temperature 97.6 F (36.4 C), temperature source Oral, resp. rate 18, height 5\' 10"  (1.778 m), last menstrual period 09/29/2013, SpO2 98 %.  Physical Exam  Constitutional: She is oriented to person, place, and time. She appears well-developed and well-nourished. No distress.  HENT:  Head: Normocephalic and atraumatic.  Cardiovascular: Normal rate.   Respiratory: Effort normal. No respiratory distress.  GI: Soft. There is no tenderness. There is no rebound and no guarding.  Musculoskeletal: She exhibits no edema or tenderness.  Neurological: She is alert and oriented to person, place, and time.  Skin: Skin is warm and dry.    MAU Course  Procedures  MDM FHM: Cat1, reactive, reassuring No contractions on toco  HELLP labs: negative  Assessment and Plan  Patient is 28 y.o. G3P1011 [redacted]w[redacted]d reporting elevated BP likely secondary to gestational HTN.  HELLP labs negative. BPP 8/8. - fetal kick counts reinforced - preterm labor precautions - F/u next week in [place] 07/03/2014, 10:30 AM   OB fellow attestation:  I have seen and examined this patient; I agree with above documentation in the resident's note.   [name] is a 28 y.o. G3P1011 referred from MFM for evaluation of preeclampsia 2/2 elevated blood pressures - no HA, no visual changes, no RUQ pain, no increased  swelling +FM, denies LOF, VB, contractions, vaginal discharge.  PE: BP 138/69 mmHg  Pulse 93  Temp(Src) 97.6 F (36.4 C) (Oral)  Resp 18  Ht 5\' 10"  (1.778 m)  SpO2 98%  LMP 09/29/2013 Gen: calm comfortable, NAD Resp: normal effort, no distress Abd: gravid  ROS, labs, PMH reviewed NST reactive No clonus, reflexes 2+   Labs: Results for orders placed or performed during the hospital encounter of 07/03/14 (from the past 24 hour(s))  CBC with Differential   Collection Time: 07/03/14 10:30 AM  Result Value Ref Range   WBC 10.2 4.0 - 10.5 K/uL   RBC 3.63 (L) 3.87 - 5.11 MIL/uL   Hemoglobin 10.6 (L) 12.0 - 15.0 g/dL   HCT 31.8 (L) 36.0 - 46.0 %   MCV 87.6 78.0 - 100.0 fL   MCH 29.2 26.0 - 34.0 pg   MCHC 33.3 30.0 - 36.0 g/dL   RDW 14.4 11.5 - 15.5 %   Platelets 202 150 - 400 K/uL   Neutrophils Relative % 71 43 - 77 %   Neutro Abs 7.3 1.7 - 7.7 K/uL   Lymphocytes Relative 17 12 - 46 %   Lymphs Abs 1.8 0.7 - 4.0 K/uL   Monocytes Relative 11 3 - 12 %   Monocytes Absolute 1.1 (H) 0.1 - 1.0 K/uL   Eosinophils Relative 1 0 - 5 %   Eosinophils Absolute 0.1 0.0 - 0.7 K/uL   Basophils Relative 0 0 - 1 %   Basophils Absolute 0.0 0.0 - 0.1 K/uL  Comprehensive metabolic panel   Collection Time: 07/03/14 10:30 AM  Result Value Ref Range   Sodium 136 (L) 137 - 147 mEq/L   Potassium 4.3 3.7 - 5.3 mEq/L   Chloride 104 96 - 112 mEq/L   CO2 20 19 - 32 mEq/L   Glucose, Bld 91 70 - 99 mg/dL   BUN 12 6 - 23 mg/dL   Creatinine, Ser 0.79 0.50 - 1.10 mg/dL   Calcium 8.5 8.4 - 10.5 mg/dL   Total Protein 5.8 (L) 6.0 - 8.3 g/dL   Albumin 2.4 (L) 3.5 - 5.2 g/dL   AST 16 0 - 37 U/L   ALT 12 0 - 35 U/L   Alkaline Phosphatase 149 (H) 39 - 117 U/L   Total Bilirubin <0.2 (L) 0.3 - 1.2 mg/dL   GFR calc non Af Amer >90 >90 mL/min   GFR calc Af Amer >90 >90 mL/min   Anion gap 12 5 - 15  Urinalysis, Routine w reflex microscopic   Collection Time: 07/03/14 10:38 AM  Result Value Ref Range    Color, Urine YELLOW YELLOW   APPearance CLEAR CLEAR   Specific Gravity, Urine 1.020 1.005 - 1.030   pH 6.5 5.0 - 8.0   Glucose, UA NEGATIVE NEGATIVE mg/dL   Hgb urine dipstick NEGATIVE NEGATIVE   Bilirubin Urine NEGATIVE NEGATIVE  Ketones, ur NEGATIVE NEGATIVE mg/dL   Protein, ur NEGATIVE NEGATIVE mg/dL   Urobilinogen, UA 0.2 0.0 - 1.0 mg/dL   Nitrite NEGATIVE NEGATIVE   Leukocytes, UA NEGATIVE NEGATIVE  Protein / creatinine ratio, urine   Collection Time: 07/03/14 10:42 AM  Result Value Ref Range   Creatinine, Urine 127.72 mg/dL   Total Protein, Urine 12.4 mg/dL   Protein Creatinine Ratio 0.10 0.00 - 0.15       Plan: - fetal kick counts reinforced - preeclampsia precautions discussed - continue routine follow up in OB clinic  Merla Riches, MD 7:08 PM

## 2014-07-03 NOTE — Discharge Instructions (Signed)

## 2014-07-03 NOTE — MAU Note (Signed)
Pt was seen in [place] today with elevated BP and currently on meds for HTN.  Gestational diabetic controlled by diet.  Denies vaginal bleeding or ROM.  Good fetal movement.  Had BPP of 8 of 8 in [place] without reactive NST.

## 2014-07-06 ENCOUNTER — Encounter: Payer: BC Managed Care – PPO | Admitting: Obstetrics & Gynecology

## 2014-07-06 ENCOUNTER — Ambulatory Visit (INDEPENDENT_AMBULATORY_CARE_PROVIDER_SITE_OTHER): Payer: BC Managed Care – PPO | Admitting: Obstetrics & Gynecology

## 2014-07-06 VITALS — BP 143/92 | HR 94 | Wt 348.0 lb

## 2014-07-06 DIAGNOSIS — O133 Gestational [pregnancy-induced] hypertension without significant proteinuria, third trimester: Secondary | ICD-10-CM | POA: Diagnosis not present

## 2014-07-06 DIAGNOSIS — Z3483 Encounter for supervision of other normal pregnancy, third trimester: Secondary | ICD-10-CM

## 2014-07-06 NOTE — Progress Notes (Signed)
BG all in range. Had MAU eval for elevated BP at her [place] visit 11/27, neg for preeclampsia. Will increase labetalol to 200 mg TID. NST today reactive baseline 135

## 2014-07-06 NOTE — Progress Notes (Signed)
NST today. Pt finished abx for UTI. Fasting highest 92 o 06/22/14. Lowest fasting 72 11/24. Highest non-fasting 126 after breakfast 07/05/14. Lowest non-fasting 85 after lunch 11/28.

## 2014-07-09 ENCOUNTER — Other Ambulatory Visit: Payer: Self-pay | Admitting: Obstetrics & Gynecology

## 2014-07-09 ENCOUNTER — Other Ambulatory Visit (HOSPITAL_COMMUNITY): Payer: Self-pay | Admitting: Maternal and Fetal Medicine

## 2014-07-09 ENCOUNTER — Ambulatory Visit (HOSPITAL_COMMUNITY)
Admission: RE | Admit: 2014-07-09 | Discharge: 2014-07-09 | Disposition: A | Discharge status: Home / Self Care | Payer: BC Managed Care – PPO | Source: Ambulatory Visit | Attending: Obstetrics & Gynecology | Admitting: Obstetrics & Gynecology

## 2014-07-09 VITALS — BP 147/85 | HR 77 | Wt 346.0 lb

## 2014-07-09 DIAGNOSIS — O99210 Obesity complicating pregnancy, unspecified trimester: Secondary | ICD-10-CM | POA: Insufficient documentation

## 2014-07-09 DIAGNOSIS — O10013 Pre-existing essential hypertension complicating pregnancy, third trimester: Secondary | ICD-10-CM

## 2014-07-09 DIAGNOSIS — O99213 Obesity complicating pregnancy, third trimester: Secondary | ICD-10-CM

## 2014-07-09 DIAGNOSIS — Z3A36 36 weeks gestation of pregnancy: Secondary | ICD-10-CM | POA: Diagnosis not present

## 2014-07-09 DIAGNOSIS — O10913 Unspecified pre-existing hypertension complicating pregnancy, third trimester: Secondary | ICD-10-CM

## 2014-07-09 DIAGNOSIS — O289 Unspecified abnormal findings on antenatal screening of mother: Secondary | ICD-10-CM | POA: Diagnosis present

## 2014-07-09 DIAGNOSIS — IMO0002 Reserved for concepts with insufficient information to code with codable children: Secondary | ICD-10-CM | POA: Insufficient documentation

## 2014-07-09 DIAGNOSIS — O24410 Gestational diabetes mellitus in pregnancy, diet controlled: Secondary | ICD-10-CM | POA: Insufficient documentation

## 2014-07-09 DIAGNOSIS — O3429 Maternal care due to uterine scar from other previous surgery: Secondary | ICD-10-CM | POA: Insufficient documentation

## 2014-07-09 DIAGNOSIS — O3421 Maternal care for scar from previous cesarean delivery: Secondary | ICD-10-CM | POA: Diagnosis not present

## 2014-07-09 DIAGNOSIS — O09293 Supervision of pregnancy with other poor reproductive or obstetric history, third trimester: Secondary | ICD-10-CM

## 2014-07-09 DIAGNOSIS — O288 Other abnormal findings on antenatal screening of mother: Secondary | ICD-10-CM

## 2014-07-09 DIAGNOSIS — O09299 Supervision of pregnancy with other poor reproductive or obstetric history, unspecified trimester: Secondary | ICD-10-CM | POA: Insufficient documentation

## 2014-07-09 DIAGNOSIS — Z98891 History of uterine scar from previous surgery: Secondary | ICD-10-CM

## 2014-07-09 DIAGNOSIS — O34219 Maternal care for unspecified type scar from previous cesarean delivery: Secondary | ICD-10-CM

## 2014-07-13 ENCOUNTER — Ambulatory Visit (INDEPENDENT_AMBULATORY_CARE_PROVIDER_SITE_OTHER): Payer: BC Managed Care – PPO | Admitting: Obstetrics & Gynecology

## 2014-07-13 VITALS — BP 144/99 | HR 96 | Wt 347.0 lb

## 2014-07-13 DIAGNOSIS — Z3483 Encounter for supervision of other normal pregnancy, third trimester: Secondary | ICD-10-CM

## 2014-07-13 DIAGNOSIS — Z3491 Encounter for supervision of normal pregnancy, unspecified, first trimester: Secondary | ICD-10-CM

## 2014-07-13 DIAGNOSIS — Z3403 Encounter for supervision of normal first pregnancy, third trimester: Secondary | ICD-10-CM

## 2014-07-13 DIAGNOSIS — O24410 Gestational diabetes mellitus in pregnancy, diet controlled: Secondary | ICD-10-CM

## 2014-07-13 DIAGNOSIS — Z36 Encounter for antenatal screening of mother: Secondary | ICD-10-CM

## 2014-07-13 DIAGNOSIS — O09893 Supervision of other high risk pregnancies, third trimester: Secondary | ICD-10-CM

## 2014-07-13 DIAGNOSIS — O133 Gestational [pregnancy-induced] hypertension without significant proteinuria, third trimester: Secondary | ICD-10-CM

## 2014-07-13 DIAGNOSIS — O0993 Supervision of high risk pregnancy, unspecified, third trimester: Secondary | ICD-10-CM

## 2014-07-13 LAB — OB RESULTS CONSOLE GC/CHLAMYDIA
Chlamydia: NEGATIVE
Gonorrhea: NEGATIVE

## 2014-07-13 LAB — OB RESULTS CONSOLE GBS: GBS: NEGATIVE

## 2014-07-13 MED ORDER — LABETALOL HCL 200 MG PO TABS: 400.0000 mg | ORAL_TABLET | Freq: Two times a day (BID) | ORAL | Status: DC

## 2014-07-13 NOTE — Patient Instructions (Signed)
Third Trimester of Pregnancy The third trimester is from week 29 through week 42, months 7 through 9. The third trimester is a time when the fetus is growing rapidly. At the end of the ninth month, the fetus is about 20 inches in length and weighs 6-10 pounds.  BODY CHANGES Your body goes through many changes during pregnancy. The changes vary from woman to woman.   Your weight will continue to increase. You can expect to gain 25-35 pounds (11-16 kg) by the end of the pregnancy.  You may begin to get stretch marks on your hips, abdomen, and breasts.  You may urinate more often because the fetus is moving lower into your pelvis and pressing on your bladder.  You may develop or continue to have heartburn as a result of your pregnancy.  You may develop constipation because certain hormones are causing the muscles that push waste through your intestines to slow down.  You may develop hemorrhoids or swollen, bulging veins (varicose veins).  You may have pelvic pain because of the weight gain and pregnancy hormones relaxing your joints between the bones in your pelvis. Backaches may result from overexertion of the muscles supporting your posture.  You may have changes in your hair. These can include thickening of your hair, rapid growth, and changes in texture. Some women also have hair loss during or after pregnancy, or hair that feels dry or thin. Your hair will most likely return to normal after your baby is born.  Your breasts will continue to grow and be tender. A yellow discharge may leak from your breasts called colostrum.  Your belly button may stick out.  You may feel short of breath because of your expanding uterus.  You may notice the fetus "dropping," or moving lower in your abdomen.  You may have a bloody mucus discharge. This usually occurs a few days to a week before labor begins.  Your cervix becomes thin and soft (effaced) near your due date. WHAT TO EXPECT AT YOUR PRENATAL  EXAMS  You will have prenatal exams every 2 weeks until week 36. Then, you will have weekly prenatal exams. During a routine prenatal visit:  You will be weighed to make sure you and the fetus are growing normally.  Your blood pressure is taken.  Your abdomen will be measured to track your baby's growth.  The fetal heartbeat will be listened to.  Any test results from the previous visit will be discussed.  You may have a cervical check near your due date to see if you have effaced. At around 36 weeks, your caregiver will check your cervix. At the same time, your caregiver will also perform a test on the secretions of the vaginal tissue. This test is to determine if a type of bacteria, Group B streptococcus, is present. Your caregiver will explain this further. Your caregiver may ask you:  What your birth plan is.  How you are feeling.  If you are feeling the baby move.  If you have had any abnormal symptoms, such as leaking fluid, bleeding, severe headaches, or abdominal cramping.  If you have any questions. Other tests or screenings that may be performed during your third trimester include:  Blood tests that check for low iron levels (anemia).  Fetal testing to check the health, activity level, and growth of the fetus. Testing is done if you have certain medical conditions or if there are problems during the pregnancy. FALSE LABOR You may feel small, irregular contractions that   eventually go away. These are called Braxton Hicks contractions, or false labor. Contractions may last for hours, days, or even weeks before true labor sets in. If contractions come at regular intervals, intensify, or become painful, it is best to be seen by your caregiver.  SIGNS OF LABOR   Menstrual-like cramps.  Contractions that are 5 minutes apart or less.  Contractions that start on the top of the uterus and spread down to the lower abdomen and back.  A sense of increased pelvic pressure or back  pain.  A watery or bloody mucus discharge that comes from the vagina. If you have any of these signs before the 37th week of pregnancy, call your caregiver right away. You need to go to the hospital to get checked immediately. HOME CARE INSTRUCTIONS   Avoid all smoking, herbs, alcohol, and unprescribed drugs. These chemicals affect the formation and growth of the baby.  Follow your caregiver's instructions regarding medicine use. There are medicines that are either safe or unsafe to take during pregnancy.  Exercise only as directed by your caregiver. Experiencing uterine cramps is a good sign to stop exercising.  Continue to eat regular, healthy meals.  Wear a good support bra for breast tenderness.  Do not use hot tubs, steam rooms, or saunas.  Wear your seat belt at all times when driving.  Avoid raw meat, uncooked cheese, cat litter boxes, and soil used by cats. These carry germs that can cause birth defects in the baby.  Take your prenatal vitamins.  Try taking a stool softener (if your caregiver approves) if you develop constipation. Eat more high-fiber foods, such as fresh vegetables or fruit and whole grains. Drink plenty of fluids to keep your urine clear or pale yellow.  Take warm sitz baths to soothe any pain or discomfort caused by hemorrhoids. Use hemorrhoid cream if your caregiver approves.  If you develop varicose veins, wear support hose. Elevate your feet for 15 minutes, 3-4 times a day. Limit salt in your diet.  Avoid heavy lifting, wear low heal shoes, and practice good posture.  Rest a lot with your legs elevated if you have leg cramps or low back pain.  Visit your dentist if you have not gone during your pregnancy. Use a soft toothbrush to brush your teeth and be gentle when you floss.  A sexual relationship may be continued unless your caregiver directs you otherwise.  Do not travel far distances unless it is absolutely necessary and only with the approval  of your caregiver.  Take prenatal classes to understand, practice, and ask questions about the labor and delivery.  Make a trial run to the hospital.  Pack your hospital bag.  Prepare the baby's nursery.  Continue to go to all your prenatal visits as directed by your caregiver. SEEK MEDICAL CARE IF:  You are unsure if you are in labor or if your water has broken.  You have dizziness.  You have mild pelvic cramps, pelvic pressure, or nagging pain in your abdominal area.  You have persistent nausea, vomiting, or diarrhea.  You have a bad smelling vaginal discharge.  You have pain with urination. SEEK IMMEDIATE MEDICAL CARE IF:   You have a fever.  You are leaking fluid from your vagina.  You have spotting or bleeding from your vagina.  You have severe abdominal cramping or pain.  You have rapid weight loss or gain.  You have shortness of breath with chest pain.  You notice sudden or extreme swelling   of your face, hands, ankles, feet, or legs.  You have not felt your baby move in over an hour.  You have severe headaches that do not go away with medicine.  You have vision changes. Document Released: 07/18/2001 Document Revised: 07/29/2013 Document Reviewed: 09/24/2012 [employer] Patient Information 2015 [employer]. This information is not intended to replace advice given to you by your health care provider. Make sure you discuss any questions you have with your health care provider.  

## 2014-07-13 NOTE — Progress Notes (Signed)
NST reactive, increase labetalol 400 mg BID. All BG in range as before.

## 2014-07-14 LAB — GC/CHLAMYDIA PROBE AMP
CT PROBE, AMP APTIMA: NEGATIVE
GC Probe RNA: NEGATIVE

## 2014-07-15 LAB — CULTURE, BETA STREP (GROUP B ONLY)

## 2014-07-16 ENCOUNTER — Encounter (HOSPITAL_COMMUNITY): Payer: Self-pay

## 2014-07-16 ENCOUNTER — Ambulatory Visit (HOSPITAL_COMMUNITY)
Admission: RE | Admit: 2014-07-16 | Discharge: 2014-07-16 | Disposition: A | Discharge status: Home / Self Care | Payer: BC Managed Care – PPO | Source: Ambulatory Visit | Attending: Obstetrics & Gynecology | Admitting: Obstetrics & Gynecology

## 2014-07-16 ENCOUNTER — Other Ambulatory Visit: Payer: Self-pay | Admitting: Obstetrics & Gynecology

## 2014-07-16 VITALS — BP 133/94 | HR 86

## 2014-07-16 DIAGNOSIS — O99213 Obesity complicating pregnancy, third trimester: Secondary | ICD-10-CM

## 2014-07-16 DIAGNOSIS — O288 Other abnormal findings on antenatal screening of mother: Secondary | ICD-10-CM

## 2014-07-16 DIAGNOSIS — O10013 Pre-existing essential hypertension complicating pregnancy, third trimester: Secondary | ICD-10-CM

## 2014-07-16 DIAGNOSIS — O10913 Unspecified pre-existing hypertension complicating pregnancy, third trimester: Secondary | ICD-10-CM | POA: Diagnosis present

## 2014-07-16 DIAGNOSIS — O24410 Gestational diabetes mellitus in pregnancy, diet controlled: Secondary | ICD-10-CM

## 2014-07-16 DIAGNOSIS — O34219 Maternal care for unspecified type scar from previous cesarean delivery: Secondary | ICD-10-CM

## 2014-07-16 DIAGNOSIS — O3429 Maternal care due to uterine scar from other previous surgery: Secondary | ICD-10-CM

## 2014-07-16 DIAGNOSIS — Z3A37 37 weeks gestation of pregnancy: Secondary | ICD-10-CM | POA: Insufficient documentation

## 2014-07-20 ENCOUNTER — Encounter: Payer: BC Managed Care – PPO | Admitting: Obstetrics & Gynecology

## 2014-07-20 ENCOUNTER — Ambulatory Visit (INDEPENDENT_AMBULATORY_CARE_PROVIDER_SITE_OTHER): Payer: BC Managed Care – PPO | Admitting: Obstetrics & Gynecology

## 2014-07-20 VITALS — BP 118/73 | HR 84

## 2014-07-20 DIAGNOSIS — Z3A37 37 weeks gestation of pregnancy: Secondary | ICD-10-CM | POA: Diagnosis not present

## 2014-07-20 DIAGNOSIS — Z3403 Encounter for supervision of normal first pregnancy, third trimester: Secondary | ICD-10-CM

## 2014-07-20 DIAGNOSIS — O24410 Gestational diabetes mellitus in pregnancy, diet controlled: Secondary | ICD-10-CM

## 2014-07-20 DIAGNOSIS — O133 Gestational [pregnancy-induced] hypertension without significant proteinuria, third trimester: Secondary | ICD-10-CM | POA: Diagnosis not present

## 2014-07-20 NOTE — Patient Instructions (Signed)
Third Trimester of Pregnancy The third trimester is from week 29 through week 42, months 7 through 9. The third trimester is a time when the fetus is growing rapidly. At the end of the ninth month, the fetus is about 20 inches in length and weighs 6-10 pounds.  BODY CHANGES Your body goes through many changes during pregnancy. The changes vary from woman to woman.   Your weight will continue to increase. You can expect to gain 25-35 pounds (11-16 kg) by the end of the pregnancy.  You may begin to get stretch marks on your hips, abdomen, and breasts.  You may urinate more often because the fetus is moving lower into your pelvis and pressing on your bladder.  You may develop or continue to have heartburn as a result of your pregnancy.  You may develop constipation because certain hormones are causing the muscles that push waste through your intestines to slow down.  You may develop hemorrhoids or swollen, bulging veins (varicose veins).  You may have pelvic pain because of the weight gain and pregnancy hormones relaxing your joints between the bones in your pelvis. Backaches may result from overexertion of the muscles supporting your posture.  You may have changes in your hair. These can include thickening of your hair, rapid growth, and changes in texture. Some women also have hair loss during or after pregnancy, or hair that feels dry or thin. Your hair will most likely return to normal after your baby is born.  Your breasts will continue to grow and be tender. A yellow discharge may leak from your breasts called colostrum.  Your belly button may stick out.  You may feel short of breath because of your expanding uterus.  You may notice the fetus "dropping," or moving lower in your abdomen.  You may have a bloody mucus discharge. This usually occurs a few days to a week before labor begins.  Your cervix becomes thin and soft (effaced) near your due date. WHAT TO EXPECT AT YOUR PRENATAL  EXAMS  You will have prenatal exams every 2 weeks until week 36. Then, you will have weekly prenatal exams. During a routine prenatal visit:  You will be weighed to make sure you and the fetus are growing normally.  Your blood pressure is taken.  Your abdomen will be measured to track your baby's growth.  The fetal heartbeat will be listened to.  Any test results from the previous visit will be discussed.  You may have a cervical check near your due date to see if you have effaced. At around 36 weeks, your caregiver will check your cervix. At the same time, your caregiver will also perform a test on the secretions of the vaginal tissue. This test is to determine if a type of bacteria, Group B streptococcus, is present. Your caregiver will explain this further. Your caregiver may ask you:  What your birth plan is.  How you are feeling.  If you are feeling the baby move.  If you have had any abnormal symptoms, such as leaking fluid, bleeding, severe headaches, or abdominal cramping.  If you have any questions. Other tests or screenings that may be performed during your third trimester include:  Blood tests that check for low iron levels (anemia).  Fetal testing to check the health, activity level, and growth of the fetus. Testing is done if you have certain medical conditions or if there are problems during the pregnancy. FALSE LABOR You may feel small, irregular contractions that   eventually go away. These are called Braxton Hicks contractions, or false labor. Contractions may last for hours, days, or even weeks before true labor sets in. If contractions come at regular intervals, intensify, or become painful, it is best to be seen by your caregiver.  SIGNS OF LABOR   Menstrual-like cramps.  Contractions that are 5 minutes apart or less.  Contractions that start on the top of the uterus and spread down to the lower abdomen and back.  A sense of increased pelvic pressure or back  pain.  A watery or bloody mucus discharge that comes from the vagina. If you have any of these signs before the 37th week of pregnancy, call your caregiver right away. You need to go to the hospital to get checked immediately. HOME CARE INSTRUCTIONS   Avoid all smoking, herbs, alcohol, and unprescribed drugs. These chemicals affect the formation and growth of the baby.  Follow your caregiver's instructions regarding medicine use. There are medicines that are either safe or unsafe to take during pregnancy.  Exercise only as directed by your caregiver. Experiencing uterine cramps is a good sign to stop exercising.  Continue to eat regular, healthy meals.  Wear a good support bra for breast tenderness.  Do not use hot tubs, steam rooms, or saunas.  Wear your seat belt at all times when driving.  Avoid raw meat, uncooked cheese, cat litter boxes, and soil used by cats. These carry germs that can cause birth defects in the baby.  Take your prenatal vitamins.  Try taking a stool softener (if your caregiver approves) if you develop constipation. Eat more high-fiber foods, such as fresh vegetables or fruit and whole grains. Drink plenty of fluids to keep your urine clear or pale yellow.  Take warm sitz baths to soothe any pain or discomfort caused by hemorrhoids. Use hemorrhoid cream if your caregiver approves.  If you develop varicose veins, wear support hose. Elevate your feet for 15 minutes, 3-4 times a day. Limit salt in your diet.  Avoid heavy lifting, wear low heal shoes, and practice good posture.  Rest a lot with your legs elevated if you have leg cramps or low back pain.  Visit your dentist if you have not gone during your pregnancy. Use a soft toothbrush to brush your teeth and be gentle when you floss.  A sexual relationship may be continued unless your caregiver directs you otherwise.  Do not travel far distances unless it is absolutely necessary and only with the approval  of your caregiver.  Take prenatal classes to understand, practice, and ask questions about the labor and delivery.  Make a trial run to the hospital.  Pack your hospital bag.  Prepare the baby's nursery.  Continue to go to all your prenatal visits as directed by your caregiver. SEEK MEDICAL CARE IF:  You are unsure if you are in labor or if your water has broken.  You have dizziness.  You have mild pelvic cramps, pelvic pressure, or nagging pain in your abdominal area.  You have persistent nausea, vomiting, or diarrhea.  You have a bad smelling vaginal discharge.  You have pain with urination. SEEK IMMEDIATE MEDICAL CARE IF:   You have a fever.  You are leaking fluid from your vagina.  You have spotting or bleeding from your vagina.  You have severe abdominal cramping or pain.  You have rapid weight loss or gain.  You have shortness of breath with chest pain.  You notice sudden or extreme swelling   of your face, hands, ankles, feet, or legs.  You have not felt your baby move in over an hour.  You have severe headaches that do not go away with medicine.  You have vision changes. Document Released: 07/18/2001 Document Revised: 07/29/2013 Document Reviewed: 09/24/2012 [employer] Patient Information 2015 [employer]. This information is not intended to replace advice given to you by your health care provider. Make sure you discuss any questions you have with your health care provider.  

## 2014-07-20 NOTE — Progress Notes (Signed)
Doing well, BG in range and BP looks good. NST reactive today

## 2014-07-20 NOTE — Addendum Note (Signed)
Addended by: Gretchen Short on: 07/20/2014 09:31 AM   Modules accepted: Orders

## 2014-07-20 NOTE — Progress Notes (Signed)
This encounter was created in error - please disregard.

## 2014-07-23 ENCOUNTER — Ambulatory Visit (HOSPITAL_COMMUNITY)
Admission: RE | Admit: 2014-07-23 | Discharge: 2014-07-23 | Disposition: A | Discharge status: Home / Self Care | Payer: BC Managed Care – PPO | Source: Ambulatory Visit | Attending: Family Medicine | Admitting: Family Medicine

## 2014-07-23 ENCOUNTER — Ambulatory Visit (HOSPITAL_COMMUNITY)
Admission: RE | Admit: 2014-07-23 | Discharge: 2014-07-23 | Disposition: A | Discharge status: Home / Self Care | Payer: BC Managed Care – PPO | Source: Ambulatory Visit | Attending: Obstetrics & Gynecology | Admitting: Obstetrics & Gynecology

## 2014-07-23 DIAGNOSIS — O99213 Obesity complicating pregnancy, third trimester: Secondary | ICD-10-CM | POA: Diagnosis not present

## 2014-07-23 DIAGNOSIS — O10013 Pre-existing essential hypertension complicating pregnancy, third trimester: Secondary | ICD-10-CM

## 2014-07-23 DIAGNOSIS — Z3A38 38 weeks gestation of pregnancy: Secondary | ICD-10-CM | POA: Diagnosis not present

## 2014-07-23 DIAGNOSIS — Z79899 Other long term (current) drug therapy: Secondary | ICD-10-CM | POA: Diagnosis not present

## 2014-07-23 DIAGNOSIS — O24410 Gestational diabetes mellitus in pregnancy, diet controlled: Secondary | ICD-10-CM | POA: Insufficient documentation

## 2014-07-23 DIAGNOSIS — O288 Other abnormal findings on antenatal screening of mother: Secondary | ICD-10-CM

## 2014-07-23 DIAGNOSIS — O3421 Maternal care for scar from previous cesarean delivery: Secondary | ICD-10-CM | POA: Insufficient documentation

## 2014-07-23 DIAGNOSIS — O34219 Maternal care for unspecified type scar from previous cesarean delivery: Secondary | ICD-10-CM

## 2014-07-23 DIAGNOSIS — O3429 Maternal care due to uterine scar from other previous surgery: Secondary | ICD-10-CM | POA: Diagnosis not present

## 2014-07-23 DIAGNOSIS — O99210 Obesity complicating pregnancy, unspecified trimester: Secondary | ICD-10-CM | POA: Diagnosis present

## 2014-07-23 DIAGNOSIS — O09299 Supervision of pregnancy with other poor reproductive or obstetric history, unspecified trimester: Secondary | ICD-10-CM | POA: Diagnosis not present

## 2014-07-24 ENCOUNTER — Ambulatory Visit (HOSPITAL_COMMUNITY): Payer: BC Managed Care – PPO

## 2014-07-24 DIAGNOSIS — Z3A38 38 weeks gestation of pregnancy: Secondary | ICD-10-CM | POA: Insufficient documentation

## 2014-07-24 LAB — CULTURE, URINE COMPREHENSIVE

## 2014-07-27 ENCOUNTER — Ambulatory Visit (INDEPENDENT_AMBULATORY_CARE_PROVIDER_SITE_OTHER): Payer: BC Managed Care – PPO | Admitting: Family Medicine

## 2014-07-27 VITALS — BP 183/109 | HR 99

## 2014-07-27 DIAGNOSIS — O10913 Unspecified pre-existing hypertension complicating pregnancy, third trimester: Secondary | ICD-10-CM | POA: Diagnosis not present

## 2014-07-27 DIAGNOSIS — Z3403 Encounter for supervision of normal first pregnancy, third trimester: Secondary | ICD-10-CM

## 2014-07-27 NOTE — Progress Notes (Signed)
NST today Baseline 135 and mod variability with no decels.  Scheduled for induction 07/30/14

## 2014-07-27 NOTE — Progress Notes (Signed)
Category 1 tracing with baseline in 130s.  Moderate variability, multiple accelerations, no decelerations. Induction for 12/24. No other complaints CBGs within range

## 2014-07-29 ENCOUNTER — Telehealth (HOSPITAL_COMMUNITY): Payer: Self-pay | Admitting: *Deleted

## 2014-07-29 NOTE — Telephone Encounter (Signed)
Preadmission screen  

## 2014-07-30 ENCOUNTER — Inpatient Hospital Stay (HOSPITAL_COMMUNITY)
Admission: RE | Admit: 2014-07-30 | Discharge: 2014-07-30 | Disposition: A | Discharge status: Home / Self Care | Payer: BC Managed Care – PPO | Source: Ambulatory Visit | Attending: Family Medicine | Admitting: Family Medicine

## 2014-07-30 ENCOUNTER — Inpatient Hospital Stay (HOSPITAL_COMMUNITY)
Admission: AD | Admit: 2014-07-30 | Discharge: 2014-08-01 | DRG: 774 | Disposition: A | Discharge status: Home / Self Care | Payer: BC Managed Care – PPO | Source: Ambulatory Visit | Attending: Obstetrics and Gynecology | Admitting: Obstetrics and Gynecology

## 2014-07-30 ENCOUNTER — Inpatient Hospital Stay (HOSPITAL_COMMUNITY): Payer: BC Managed Care – PPO | Admitting: Anesthesiology

## 2014-07-30 ENCOUNTER — Encounter (HOSPITAL_COMMUNITY): Payer: Self-pay | Admitting: *Deleted

## 2014-07-30 DIAGNOSIS — Z3A39 39 weeks gestation of pregnancy: Secondary | ICD-10-CM | POA: Diagnosis present

## 2014-07-30 DIAGNOSIS — O3421 Maternal care for scar from previous cesarean delivery: Secondary | ICD-10-CM | POA: Diagnosis present

## 2014-07-30 DIAGNOSIS — O1092 Unspecified pre-existing hypertension complicating childbirth: Secondary | ICD-10-CM | POA: Diagnosis present

## 2014-07-30 DIAGNOSIS — Z833 Family history of diabetes mellitus: Secondary | ICD-10-CM | POA: Diagnosis not present

## 2014-07-30 DIAGNOSIS — Z8249 Family history of ischemic heart disease and other diseases of the circulatory system: Secondary | ICD-10-CM | POA: Diagnosis not present

## 2014-07-30 DIAGNOSIS — O24420 Gestational diabetes mellitus in childbirth, diet controlled: Secondary | ICD-10-CM

## 2014-07-30 DIAGNOSIS — O24410 Gestational diabetes mellitus in pregnancy, diet controlled: Secondary | ICD-10-CM | POA: Diagnosis present

## 2014-07-30 DIAGNOSIS — O133 Gestational [pregnancy-induced] hypertension without significant proteinuria, third trimester: Secondary | ICD-10-CM

## 2014-07-30 DIAGNOSIS — O139 Gestational [pregnancy-induced] hypertension without significant proteinuria, unspecified trimester: Secondary | ICD-10-CM | POA: Diagnosis present

## 2014-07-30 LAB — CBC
HCT: 29.8 % — ABNORMAL LOW (ref 36.0–46.0)
HCT: 32.5 % — ABNORMAL LOW (ref 36.0–46.0)
HEMATOCRIT: 31.7 % — AB (ref 36.0–46.0)
HEMOGLOBIN: 10.7 g/dL — AB (ref 12.0–15.0)
Hemoglobin: 10 g/dL — ABNORMAL LOW (ref 12.0–15.0)
Hemoglobin: 10.5 g/dL — ABNORMAL LOW (ref 12.0–15.0)
MCH: 28.8 pg (ref 26.0–34.0)
MCH: 28.5 pg (ref 26.0–34.0)
MCH: 28.3 pg (ref 26.0–34.0)
MCHC: 33.6 g/dL (ref 30.0–36.0)
MCHC: 33.1 g/dL (ref 30.0–36.0)
MCHC: 32.9 g/dL (ref 30.0–36.0)
MCV: 85.9 fL (ref 78.0–100.0)
MCV: 86.1 fL (ref 78.0–100.0)
MCV: 86 fL (ref 78.0–100.0)
Platelets: 169 10*3/uL (ref 150–400)
Platelets: 178 10*3/uL (ref 150–400)
Platelets: 189 10*3/uL (ref 150–400)
RBC: 3.47 MIL/uL — AB (ref 3.87–5.11)
RBC: 3.68 MIL/uL — AB (ref 3.87–5.11)
RBC: 3.78 MIL/uL — AB (ref 3.87–5.11)
RDW: 14.1 % (ref 11.5–15.5)
RDW: 14.2 % (ref 11.5–15.5)
RDW: 14.4 % (ref 11.5–15.5)
WBC: 10.4 10*3/uL (ref 4.0–10.5)
WBC: 11.8 10*3/uL — ABNORMAL HIGH (ref 4.0–10.5)
WBC: 18.5 10*3/uL — AB (ref 4.0–10.5)

## 2014-07-30 LAB — ABO/RH: ABO/RH(D): O POS

## 2014-07-30 LAB — COMPREHENSIVE METABOLIC PANEL
ALBUMIN: 2.4 g/dL — AB (ref 3.5–5.2)
ALK PHOS: 155 U/L — AB (ref 39–117)
ALK PHOS: 163 U/L — AB (ref 39–117)
ALT: 12 U/L (ref 0–35)
ALT: 12 U/L (ref 0–35)
AST: 19 U/L (ref 0–37)
AST: 21 U/L (ref 0–37)
Albumin: 2.6 g/dL — ABNORMAL LOW (ref 3.5–5.2)
Anion gap: 8 (ref 5–15)
Anion gap: 8 (ref 5–15)
BUN: 8 mg/dL (ref 6–23)
BUN: 7 mg/dL (ref 6–23)
CO2: 20 mmol/L (ref 19–32)
CO2: 21 mmol/L (ref 19–32)
Calcium: 8.1 mg/dL — ABNORMAL LOW (ref 8.4–10.5)
Calcium: 8.2 mg/dL — ABNORMAL LOW (ref 8.4–10.5)
Chloride: 108 mEq/L (ref 96–112)
Chloride: 107 mEq/L (ref 96–112)
Creatinine, Ser: 0.63 mg/dL (ref 0.50–1.10)
Creatinine, Ser: 0.73 mg/dL (ref 0.50–1.10)
GFR calc Af Amer: >90 mL/min (ref >90)
GFR calc non Af Amer: >90 mL/min (ref >90)
GFR calc non Af Amer: >90 mL/min (ref >90)
GLUCOSE: 89 mg/dL (ref 70–99)
Glucose, Bld: 116 mg/dL — ABNORMAL HIGH (ref 70–99)
POTASSIUM: 3.9 mmol/L (ref 3.5–5.1)
POTASSIUM: 3.9 mmol/L (ref 3.5–5.1)
Sodium: 136 mmol/L (ref 135–145)
Sodium: 136 mmol/L (ref 135–145)
TOTAL PROTEIN: 5.8 g/dL — AB (ref 6.0–8.3)
TOTAL PROTEIN: 5.8 g/dL — AB (ref 6.0–8.3)
Total Bilirubin: 0.5 mg/dL (ref 0.3–1.2)
Total Bilirubin: 0.5 mg/dL (ref 0.3–1.2)

## 2014-07-30 LAB — URIC ACID: Uric Acid, Serum: 5.8 mg/dL (ref 2.4–7.0)

## 2014-07-30 LAB — TYPE AND SCREEN
ABO/RH(D): O POS
ANTIBODY SCREEN: NEGATIVE

## 2014-07-30 LAB — LACTATE DEHYDROGENASE: LDH: 195 U/L (ref 94–250)

## 2014-07-30 LAB — HIV ANTIBODY (ROUTINE TESTING W REFLEX): HIV: NONREACTIVE

## 2014-07-30 LAB — RPR

## 2014-07-30 LAB — GLUCOSE, CAPILLARY: Glucose-Capillary: 133 mg/dL — ABNORMAL HIGH (ref 70–99)

## 2014-07-30 MED ORDER — MAGNESIUM SULFATE BOLUS VIA INFUSION
4.0000 g | Freq: Once | INTRAVENOUS | Status: AC
  Administered 2014-07-30: 4 g via INTRAVENOUS
  Filled 2014-07-30: qty 500

## 2014-07-30 MED ORDER — LACTATED RINGERS IV SOLN: 500.0000 mL | INTRAVENOUS | Status: DC | PRN

## 2014-07-30 MED ORDER — SODIUM CHLORIDE 0.9 % IV SOLN: Freq: Once | INTRAVENOUS | Status: DC

## 2014-07-30 MED ORDER — MISOPROSTOL 200 MCG PO TABS
ORAL_TABLET | ORAL | Status: AC
  Administered 2014-07-30: 1000 ug via RECTAL
  Filled 2014-07-30: qty 5

## 2014-07-30 MED ORDER — ONDANSETRON HCL 4 MG/2ML IJ SOLN
4.0000 mg | Freq: Four times a day (QID) | INTRAMUSCULAR | Status: DC | PRN
  Administered 2014-07-30: 4 mg via INTRAVENOUS

## 2014-07-30 MED ORDER — OXYTOCIN BOLUS FROM INFUSION
500.0000 mL | INTRAVENOUS | Status: DC
  Administered 2014-07-30: 500 mL via INTRAVENOUS

## 2014-07-30 MED ORDER — HYDRALAZINE HCL 20 MG/ML IJ SOLN
10.0000 mg | INTRAMUSCULAR | Status: DC | PRN
  Administered 2014-07-30: 10 mg via INTRAVENOUS
  Filled 2014-07-30 (×2): qty 1

## 2014-07-30 MED ORDER — ACETAMINOPHEN 325 MG PO TABS: 650.0000 mg | ORAL_TABLET | ORAL | Status: DC | PRN

## 2014-07-30 MED ORDER — LABETALOL HCL 5 MG/ML IV SOLN
20.0000 mg | INTRAVENOUS | Status: AC | PRN
  Administered 2014-07-31 (×4): 20 mg via INTRAVENOUS
  Filled 2014-07-30 (×4): qty 4

## 2014-07-30 MED ORDER — LABETALOL HCL 300 MG PO TABS
600.0000 mg | ORAL_TABLET | Freq: Two times a day (BID) | ORAL | Status: DC
  Filled 2014-07-30: qty 2

## 2014-07-30 MED ORDER — LABETALOL HCL 200 MG PO TABS
200.0000 mg | ORAL_TABLET | Freq: Two times a day (BID) | ORAL | Status: DC
  Administered 2014-07-30: 200 mg via ORAL
  Filled 2014-07-30 (×2): qty 1

## 2014-07-30 MED ORDER — LABETALOL HCL 5 MG/ML IV SOLN
10.0000 mg | INTRAVENOUS | Status: AC | PRN
Start: 2014-07-30 — End: 2014-07-30
  Administered 2014-07-30 (×3): 10 mg via INTRAVENOUS
  Filled 2014-07-30 (×2): qty 4

## 2014-07-30 MED ORDER — LABETALOL HCL 200 MG PO TABS
400.0000 mg | ORAL_TABLET | Freq: Two times a day (BID) | ORAL | Status: DC
  Filled 2014-07-30: qty 2

## 2014-07-30 MED ORDER — ONDANSETRON HCL 4 MG/2ML IJ SOLN
4.0000 mg | Freq: Four times a day (QID) | INTRAMUSCULAR | Status: DC | PRN
  Filled 2014-07-30: qty 2

## 2014-07-30 MED ORDER — LIDOCAINE HCL (PF) 1 % IJ SOLN
30.0000 mL | INTRAMUSCULAR | Status: DC | PRN
  Filled 2014-07-30: qty 30

## 2014-07-30 MED ORDER — LACTATED RINGERS IV SOLN: 500.0000 mL | Freq: Once | INTRAVENOUS | Status: DC

## 2014-07-30 MED ORDER — OXYCODONE-ACETAMINOPHEN 5-325 MG PO TABS: 1.0000 | ORAL_TABLET | ORAL | Status: DC | PRN

## 2014-07-30 MED ORDER — EPHEDRINE 5 MG/ML INJ
10.0000 mg | INTRAVENOUS | Status: DC | PRN
  Filled 2014-07-30: qty 2

## 2014-07-30 MED ORDER — OXYTOCIN 40 UNITS IN LACTATED RINGERS INFUSION - SIMPLE MED
62.5000 mL/h | INTRAVENOUS | Status: DC
  Administered 2014-07-30: 62.5 mL/h via INTRAVENOUS
  Filled 2014-07-30: qty 1000

## 2014-07-30 MED ORDER — FENTANYL CITRATE 0.05 MG/ML IJ SOLN
100.0000 ug | Freq: Once | INTRAMUSCULAR | Status: AC
  Administered 2014-07-30 (×2): 100 ug via INTRAVENOUS
  Filled 2014-07-30: qty 2

## 2014-07-30 MED ORDER — LIDOCAINE HCL (PF) 1 % IJ SOLN
INTRAMUSCULAR | Status: DC | PRN
  Administered 2014-07-30: 6 mL
  Administered 2014-07-30: 4 mL

## 2014-07-30 MED ORDER — FENTANYL CITRATE 0.05 MG/ML IJ SOLN
INTRAMUSCULAR | Status: AC
  Administered 2014-07-30: 100 ug via INTRAVENOUS
  Filled 2014-07-30: qty 2

## 2014-07-30 MED ORDER — DIPHENHYDRAMINE HCL 50 MG/ML IJ SOLN: 12.5000 mg | INTRAMUSCULAR | Status: DC | PRN

## 2014-07-30 MED ORDER — MAGNESIUM SULFATE 40 G IN LACTATED RINGERS - SIMPLE
2.0000 g/h | INTRAVENOUS | Status: DC
  Filled 2014-07-30: qty 500

## 2014-07-30 MED ORDER — FENTANYL 2.5 MCG/ML BUPIVACAINE 1/10 % EPIDURAL INFUSION (WH - ANES)
14.0000 mL/h | INTRAMUSCULAR | Status: DC | PRN
  Filled 2014-07-30: qty 125

## 2014-07-30 MED ORDER — CITRIC ACID-SODIUM CITRATE 334-500 MG/5ML PO SOLN: 30.0000 mL | ORAL | Status: DC | PRN

## 2014-07-30 MED ORDER — TERBUTALINE SULFATE 1 MG/ML IJ SOLN
0.2500 mg | Freq: Once | INTRAMUSCULAR | Status: DC | PRN
Start: 2014-07-30 — End: 2014-07-30

## 2014-07-30 MED ORDER — LACTATED RINGERS IV SOLN
INTRAVENOUS | Status: DC
  Administered 2014-07-30: 1000 mL via INTRAVENOUS

## 2014-07-30 MED ORDER — AMLODIPINE BESYLATE 10 MG PO TABS
10.0000 mg | ORAL_TABLET | Freq: Every day | ORAL | Status: DC
  Administered 2014-07-30 – 2014-08-01 (×3): 10 mg via ORAL
  Filled 2014-07-30 (×4): qty 1

## 2014-07-30 MED ORDER — OXYCODONE-ACETAMINOPHEN 5-325 MG PO TABS: 2.0000 | ORAL_TABLET | ORAL | Status: DC | PRN

## 2014-07-30 MED ORDER — PHENYLEPHRINE 40 MCG/ML (10ML) SYRINGE FOR IV PUSH (FOR BLOOD PRESSURE SUPPORT)
80.0000 ug | PREFILLED_SYRINGE | INTRAVENOUS | Status: DC | PRN
  Filled 2014-07-30: qty 10
  Filled 2014-07-30: qty 2

## 2014-07-30 MED ORDER — OXYTOCIN 40 UNITS IN LACTATED RINGERS INFUSION - SIMPLE MED
1.0000 m[IU]/min | INTRAVENOUS | Status: DC
  Administered 2014-07-30: 2 m[IU]/min via INTRAVENOUS

## 2014-07-30 MED ORDER — PHENYLEPHRINE 40 MCG/ML (10ML) SYRINGE FOR IV PUSH (FOR BLOOD PRESSURE SUPPORT)
80.0000 ug | PREFILLED_SYRINGE | INTRAVENOUS | Status: DC | PRN
  Filled 2014-07-30: qty 2

## 2014-07-30 MED ORDER — FENTANYL 2.5 MCG/ML BUPIVACAINE 1/10 % EPIDURAL INFUSION (WH - ANES): 14.0000 mL/h | INTRAMUSCULAR | Status: DC | PRN

## 2014-07-30 NOTE — Progress Notes (Signed)
Dr Deniece Ree on unit mde aware of BP's. Will wait till 1730 to consider Magnesium if no improvement.

## 2014-07-30 NOTE — Anesthesia Preprocedure Evaluation (Signed)
Anesthesia Evaluation  Patient identified by MRN, date of birth, ID band Patient awake    Reviewed: Allergy & Precautions, H&P , NPO status , Patient's Chart, lab work & pertinent test results  History of Anesthesia Complications Negative for: history of anesthetic complications  Airway Mallampati: III  TM Distance: >3 FB Neck ROM: Full    Dental no notable dental hx. (+) Dental Advisory Given   Pulmonary neg pulmonary ROS,  breath sounds clear to auscultation  Pulmonary exam normal       Cardiovascular hypertension (PIH, denies HTN outside of pregnancy), Pt. on medications Rhythm:Regular Rate:Normal     Neuro/Psych negative neurological ROS  negative psych ROS   GI/Hepatic negative GI ROS, Neg liver ROS,   Endo/Other  diabetes (Gestational and diet controlled)Morbid obesity  Renal/GU negative Renal ROS  negative genitourinary   Musculoskeletal negative musculoskeletal ROS (+)   Abdominal   Peds negative pediatric ROS (+)  Hematology negative hematology ROS (+)   Anesthesia Other Findings   Reproductive/Obstetrics (+) Pregnancy                             Anesthesia Physical Anesthesia Plan  ASA: III  Anesthesia Plan: Epidural   Post-op Pain Management:    Induction:   Airway Management Planned:   Additional Equipment:   Intra-op Plan:   Post-operative Plan:   Informed Consent: I have reviewed the patients History and Physical, chart, labs and discussed the procedure including the risks, benefits and alternatives for the proposed anesthesia with the patient or authorized representative who has indicated his/her understanding and acceptance.   Dental advisory given  Plan Discussed with:   Anesthesia Plan Comments:         Anesthesia Quick Evaluation

## 2014-07-30 NOTE — Progress Notes (Signed)
Dr Deniece Ree declines use of Magnesium at this time

## 2014-07-30 NOTE — Progress Notes (Signed)
10 mg labetalol IVP

## 2014-07-30 NOTE — Progress Notes (Signed)
10mg labetalolVP

## 2014-07-30 NOTE — Progress Notes (Signed)
Dr Deniece Ree at bedside, aware of BP, no additional meds at this time

## 2014-07-30 NOTE — H&P (Signed)
[name] is a 28 y.o. female presenting for Induction of Labor for hypertension and diabetes Maternal Medical History:  Reason for admission: Nausea.  Contractions: Frequency: irregular.   Perceived severity is moderate.    Fetal activity: Perceived fetal activity is normal.   Last perceived fetal movement was within the past hour.    Prenatal complications: PIH.   No bleeding or pre-eclampsia.   Prenatal Complications - Diabetes: gestational. Diabetes is managed by diet.      OB History    Gravida Para Term Preterm AB TAB SAB Ectopic Multiple Living   3 1 1  1  1   1      Past Medical History  Diagnosis Date  . Granuloma, skin     incisional pfannenstiel scar  . Anemia   . Preterm labor   . Hypertension   . Gestational diabetes mellitus, antepartum   . Gestational diabetes    Past Surgical History  Procedure Laterality Date  . Cesarean section    . Nasal sinus surgery  2007  . Tonsillectomy  2008  . Endometrial fulguration  07/07/11  . Abdominal surgery      TUMMY TUCK EXCISED ENDOMETRIOSIS   Family History: family history includes Cancer in her paternal grandmother and paternal uncle; Diabetes in her mother; Heart disease in her father; Hypertension in her mother. Social History:  reports that she has never smoked. She has never used smokeless tobacco. She reports that she does not drink alcohol or use illicit drugs.  Review of Systems  Constitutional: Negative for fever and chills.  Gastrointestinal: Negative for nausea, vomiting and abdominal pain.    Dilation: 1 Effacement (%): 50 Station: Ballotable Exam by:: CNM Williams Blood pressure 132/99, pulse 95, temperature 98.2 F (36.8 C), temperature source Oral, resp. rate 18, last menstrual period 09/29/2013. Maternal Exam:  Uterine Assessment: Contraction strength is mild.  Contraction frequency is irregular.   Abdomen: Surgical scars: low transverse.   Fundal height is 40.   Estimated fetal weight  is 7.5.    Introitus: Normal vulva. Normal vagina.  Vagina is negative for discharge.  Pelvis: adequate for delivery.   Cervix: Cervix evaluated by digital exam.     Fetal Exam Fetal Monitor Review: Mode: ultrasound.   Baseline rate: 140.  Pattern: accelerations present and no decelerations.    Fetal State Assessment: Category I - tracings are normal.     Physical Exam  Constitutional: She is oriented to person, place, and time. She appears well-developed and well-nourished. No distress.  Cardiovascular: Normal rate and regular rhythm.  Exam reveals no gallop and no friction rub.   No murmur heard. Respiratory: Effort normal and breath sounds normal. No respiratory distress. She has no wheezes. She has no rales.  GI: Soft. There is no tenderness. There is no rebound and no guarding.  Genitourinary: Vagina normal. No vaginal discharge found.  Musculoskeletal: Normal range of motion.  Neurological: She is alert and oriented to person, place, and time.  Skin: Skin is warm and dry.  Psychiatric: She has a normal mood and affect.    Prenatal labs: ABO, Rh: O/POS/-- (06/16 1243) Antibody: NEG (06/16 1243) Rubella: 1.03 (06/16 1243) RPR: NON REAC (10/05 1024)  HBsAg: NEGATIVE (06/16 1243)  HIV: NONREACTIVE (10/05 1024)  GBS: Negative (12/07 0000)   Assessment/Plan: A:  SIUP at [redacted]w[redacted]d        Gest Hypertension       Gest Diabetes  P:  Admit to [employer]  Routine orders       Will [place] for vertex       MAy place Foley   [facility] 07/30/2014, 8:27 AM

## 2014-07-30 NOTE — Progress Notes (Signed)
LABOR PROGRESS NOTE  [name] is a 28 y.o. G3P1011 at [redacted]w[redacted]d  admitted for induction of labor due to gDM and HTN  Subjective: Doing well  Objective: BP 172/111 mmHg  Pulse 82  Temp(Src) 98.1 F (36.7 C) (Oral)  Resp 18  Ht 5\' 9"  (1.753 m)  Wt 350 lb (158.759 kg)  BMI 51.66 kg/m2  LMP 09/29/2013 or  Filed Vitals:   07/30/14 0708 07/30/14 1103 07/30/14 1240 07/30/14 1338  BP:  160/93 159/88 172/111  Pulse:  87 92 82  Temp:  98.1 F (36.7 C)    TempSrc:  Oral    Resp:  18    Height: 5\' 9"  (1.753 m)     Weight: 350 lb (158.759 kg)          FHT:  FHR: 140 bpm, variability: moderate,  accelerations:  Present,  decelerations:  Absent UC:   none SVE:   Dilation: 2 Effacement (%): 50 Station: -3 Exam by:: Dr Deniece Ree  Dilation: 2 Effacement (%): 50 Cervical Position: Posterior Station: -3 Presentation: Vertex Exam by:: Dr Deniece Ree   Labs: Lab Results  Component Value Date   WBC 10.4 07/30/2014   HGB 10.0* 07/30/2014   HCT 29.8* 07/30/2014   MCV 85.9 07/30/2014   PLT 169 07/30/2014    Assessment / Plan: IOL 2/2 gDM, also is TOL  Labor: TOL,  [id number]: FB placed [id number]: FB still in placed, start pitocin 2x2 to 48mu until FB out for cervical ripening. Fetal Wellbeing:  Category I Pain Control:  Labor support without medications Anticipated MOD:  NSVD  Kimberly Coye ROCIO, MD 07/30/2014, 2:21 PM

## 2014-07-30 NOTE — Progress Notes (Signed)
LABOR PROGRESS NOTE  [name] is a 28 y.o. G3P1011 at [redacted]w[redacted]d  admitted for induction of labor due to gDM.  Subjective: Doing well  Objective: BP 132/99 mmHg  Pulse 95  Temp(Src) 98.2 F (36.8 C) (Oral)  Resp 18  Ht 5\' 9"  (1.753 m)  Wt 350 lb (158.759 kg)  BMI 51.66 kg/m2  LMP 09/29/2013 or  Filed Vitals:   07/30/14 0633 07/30/14 0708  BP: 132/99   Pulse: 95   Temp: 98.2 F (36.8 C)   TempSrc: Oral   Resp: 18   Height:  5\' 9"  (1.753 m)  Weight:  350 lb (158.759 kg)       FHT:  FHR: 140 bpm, variability: moderate,  accelerations:  Present,  decelerations:  Absent UC:   none SVE:   Dilation: 2 Effacement (%): 50 Station: -3 Exam by:: Dr Deniece Ree  Dilation: 2 Effacement (%): 50 Cervical Position: Posterior Station: -3 Presentation: Vertex Exam by:: Dr Deniece Ree   Labs: Lab Results  Component Value Date   WBC 10.4 07/30/2014   HGB 10.0* 07/30/2014   HCT 29.8* 07/30/2014   MCV 85.9 07/30/2014   PLT 169 07/30/2014    Assessment / Plan: IOL 2/2 gDM, also is TOL  Labor: TOL, FB placed at 0000000 without complication Fetal Wellbeing:  Category I Pain Control:  Labor support without medications Anticipated MOD:  NSVD  Jamarrius Salay ROCIO, MD 07/30/2014, 10:42 AM

## 2014-07-30 NOTE — Anesthesia Procedure Notes (Signed)
Epidural Patient location during procedure: OB  Preanesthetic Checklist Completed: patient identified, site marked, surgical consent, pre-op evaluation, timeout performed, IV checked, risks and benefits discussed and monitors and equipment checked  Epidural Patient position: sitting Prep: site prepped and draped and DuraPrep Patient monitoring: continuous pulse ox and blood pressure Approach: midline Location: L3-L4 Injection technique: LOR air  Needle:  Needle type: Tuohy  Needle gauge: 17 G Needle length: 9 cm and 9 Needle insertion depth: 11 cm Catheter type: closed end flexible Catheter size: 19 Gauge Catheter at skin depth: 18 cm Test dose: negative  Assessment Events: blood not aspirated, injection not painful, no injection resistance, negative IV test and no paresthesia  Additional Notes Dosing of Epidural:  1st dose, through catheter ............................................Marland Kitchen  Xylocaine 40 mg  2nd dose, through catheter, after waiting 3 minutes........Marland KitchenXylocaine 60 mg    ( 1% Xylo charted as a single dose in Epic Meds for ease of charting; actual dosing was fractionated as above, for saftey's sake)  As each dose occurred, patient was free of IV sx; and patient exhibited no evidence of SA injection.  Patient is more comfortable after epidural dosed. Please see RN's note for documentation of vital signs,and FHR which are stable.  Patient reminded not to try to ambulate with numb legs, and that an RN must be present when she attempts to get up.

## 2014-07-30 NOTE — Progress Notes (Signed)
Dr Deniece Ree aware of cont increased BP. MD will come over , in O.R. At this time

## 2014-07-31 LAB — MRSA PCR SCREENING: MRSA by PCR: NEGATIVE

## 2014-07-31 MED ORDER — OXYCODONE-ACETAMINOPHEN 5-325 MG PO TABS: 1.0000 | ORAL_TABLET | ORAL | Status: DC | PRN

## 2014-07-31 MED ORDER — BISACODYL 10 MG RE SUPP: 10.0000 mg | Freq: Every day | RECTAL | Status: DC | PRN

## 2014-07-31 MED ORDER — ZOLPIDEM TARTRATE 5 MG PO TABS: 5.0000 mg | ORAL_TABLET | Freq: Every evening | ORAL | Status: DC | PRN

## 2014-07-31 MED ORDER — MAGNESIUM SULFATE 40 G IN LACTATED RINGERS - SIMPLE
2.0000 g/h | INTRAVENOUS | Status: AC
  Administered 2014-07-31 (×2): 2 g/h via INTRAVENOUS
  Filled 2014-07-31 (×2): qty 500

## 2014-07-31 MED ORDER — PRENATAL MULTIVITAMIN CH
1.0000 | ORAL_TABLET | Freq: Every day | ORAL | Status: DC
  Administered 2014-07-31: 1 via ORAL
  Filled 2014-07-31: qty 1

## 2014-07-31 MED ORDER — SENNOSIDES-DOCUSATE SODIUM 8.6-50 MG PO TABS
2.0000 | ORAL_TABLET | ORAL | Status: DC
  Administered 2014-07-31: 2 via ORAL
  Filled 2014-07-31: qty 2

## 2014-07-31 MED ORDER — LANOLIN HYDROUS EX OINT: TOPICAL_OINTMENT | CUTANEOUS | Status: DC | PRN

## 2014-07-31 MED ORDER — SODIUM CHLORIDE 0.9 % IJ SOLN: 3.0000 mL | Freq: Two times a day (BID) | INTRAMUSCULAR | Status: DC

## 2014-07-31 MED ORDER — LACTATED RINGERS IV SOLN
INTRAVENOUS | Status: AC
  Administered 2014-07-31 (×2): via INTRAVENOUS

## 2014-07-31 MED ORDER — ONDANSETRON HCL 4 MG PO TABS: 4.0000 mg | ORAL_TABLET | ORAL | Status: DC | PRN

## 2014-07-31 MED ORDER — SODIUM CHLORIDE 0.9 % IJ SOLN
3.0000 mL | INTRAMUSCULAR | Status: DC | PRN
Start: 2014-07-31 — End: 2014-08-01

## 2014-07-31 MED ORDER — ONDANSETRON HCL 4 MG/2ML IJ SOLN: 4.0000 mg | INTRAMUSCULAR | Status: DC | PRN

## 2014-07-31 MED ORDER — SIMETHICONE 80 MG PO CHEW
80.0000 mg | CHEWABLE_TABLET | ORAL | Status: DC | PRN
Start: 2014-07-31 — End: 2014-08-01

## 2014-07-31 MED ORDER — DIPHENHYDRAMINE HCL 25 MG PO CAPS: 25.0000 mg | ORAL_CAPSULE | Freq: Four times a day (QID) | ORAL | Status: DC | PRN

## 2014-07-31 MED ORDER — OXYTOCIN 40 UNITS IN LACTATED RINGERS INFUSION - SIMPLE MED: 62.5000 mL/h | INTRAVENOUS | Status: DC | PRN

## 2014-07-31 MED ORDER — SODIUM CHLORIDE 0.9 % IJ SOLN: 3.0000 mL | INTRAMUSCULAR | Status: DC | PRN

## 2014-07-31 MED ORDER — BENZOCAINE-MENTHOL 20-0.5 % EX AERO
1.0000 "application " | INHALATION_SPRAY | CUTANEOUS | Status: DC | PRN
  Administered 2014-07-31: 1 via TOPICAL
  Filled 2014-07-31: qty 56

## 2014-07-31 MED ORDER — DIBUCAINE 1 % RE OINT: 1.0000 "application " | TOPICAL_OINTMENT | RECTAL | Status: DC | PRN

## 2014-07-31 MED ORDER — FLEET ENEMA 7-19 GM/118ML RE ENEM: 1.0000 | ENEMA | Freq: Every day | RECTAL | Status: DC | PRN

## 2014-07-31 MED ORDER — SODIUM CHLORIDE 0.9 % IV SOLN: 250.0000 mL | INTRAVENOUS | Status: DC | PRN

## 2014-07-31 MED ORDER — IBUPROFEN 600 MG PO TABS
600.0000 mg | ORAL_TABLET | Freq: Four times a day (QID) | ORAL | Status: DC
  Administered 2014-07-31 – 2014-08-01 (×5): 600 mg via ORAL
  Filled 2014-07-31 (×6): qty 1

## 2014-07-31 MED ORDER — WITCH HAZEL-GLYCERIN EX PADS: 1.0000 "application " | MEDICATED_PAD | CUTANEOUS | Status: DC | PRN

## 2014-07-31 NOTE — Anesthesia Postprocedure Evaluation (Signed)
  Anesthesia Post-op Note  Patient: [name]  Procedure(s) Performed: * No procedures listed *  Patient Location: ICU  Anesthesia Type:Epidural  Level of Consciousness: awake, alert  and oriented  Airway and Oxygen Therapy: Patient Spontanous Breathing  Post-op Pain: none  Post-op Assessment: Post-op Vital signs reviewed, Patient's Cardiovascular Status Stable, Respiratory Function Stable, Pain level controlled, No headache, No backache, No residual numbness and No residual motor weakness  Post-op Vital Signs: Reviewed and stable  Last Vitals:  Filed Vitals:   07/31/14 0900  BP: 125/72  Pulse: 118  Temp:   Resp:     Complications: No apparent anesthesia complications

## 2014-07-31 NOTE — Lactation Note (Addendum)
This note was copied from the chart of [name]. Lactation Consultation Note        Initial consult with this mom and baby (in CNS at the time of my visit), term and 70 hours old. Mom reports breast feeding going well. The baby has voided twice and stool times 3 already. On exam, mom has very wide spaced breast, soft and flat. She has drops of colostrum with hand expression. Mom did not breast feed her first child. Mom asked about pumping now, so that dad could also feed the baby. I advised letting the baby breast feed for the first few weeks, , to establish her supply and allow the baby to exclusively breast feed.  I am concerned that mom may have a low mlk supply, based on her wide spaced, soft ,flat breasts, but it is early, so I did not mention this to mom.  Mom knows to call for questions/concerns. Basic teaching done from the Baby and me book and from the lactation folder.   Patient Name: [name] [id number] Date: 07/31/2014 Reason for consult: Initial assessment   Maternal Data Formula Feeding for Exclusion: Yes (mom in [place]) Reason for exclusion: Admission to Intensive Care Unit (ICU) post-partum Has patient been taught Hand Expression?: Yes Does the patient have breastfeeding experience prior to this delivery?: Yes  Feeding Feeding Type: Breast Fed Length of feed: 1 min  LATCH Score/Interventions                      Lactation Tools Discussed/Used     Consult Status Consult Status: Follow-up Date: 08/01/14 Follow-up type: In-patient    Tonna Corner 07/31/2014, 2:18 PM

## 2014-07-31 NOTE — Progress Notes (Signed)
Post Partum Day 1 Subjective: no complaints, up ad lib and tolerating PO  Objective: Blood pressure 138/58, pulse 116, temperature 98.6 F (37 C), temperature source Oral, resp. rate 18, height 5\' 9"  (1.753 m), weight 158.215 kg (348 lb 12.8 oz), last menstrual period 09/29/2013, SpO2 97 %, unknown if currently breastfeeding.  Physical Exam:  General: alert Lochia: appropriate Uterine Fundus: firm Incision: n/a DVT Evaluation: No evidence of DVT seen on physical exam.   Recent Labs  07/30/14 1615 07/30/14 2205  HGB 10.5* 10.7*  HCT 31.7* 32.5*    Assessment/Plan: Plan for stopping magnesium at 24 hours pp and transfer to post partum unit   LOS: 1 day   [name] C. 07/31/2014, 8:59 AM

## 2014-08-01 MED ORDER — IBUPROFEN 600 MG PO TABS: 600.0000 mg | ORAL_TABLET | Freq: Four times a day (QID) | ORAL | Status: DC

## 2014-08-01 MED ORDER — AMLODIPINE BESYLATE 10 MG PO TABS: 10.0000 mg | ORAL_TABLET | Freq: Every day | ORAL | Status: DC

## 2014-08-01 NOTE — Lactation Note (Signed)
This note was copied from the chart of [name]. Lactation Consultation Note    Follow up consult with this mom of a term baby, now 65 hours old. The baby had 5 stools and 2 voids in first 24 hours of life, and was at 5% weight loss. Mom and baby are being discharged to home today.  On exam, mom's breasts do not appear to be transitioning into mature milk, and are still flat with little breast tissue. I told mom that my concern is that sine she did not breast feed her first baby, and this is her first time breat feeding, and I do not see positive breast changes, - mom may not be able to provide enough milk for her baby. Mom agreed to take home some Pregestimil 20 cal, just in case the baby seems unsatisfied at some time tomorrow. Mom has an appointment with her pediatrician on Monday. I advised mom to offer 30 mls of the formula , if needed, at a time. If baby voiding and stooling and feeding well, avoid the formula until she sees her pediatrician. Mom knows to call lactation for questions/concerns, as needed.   Patient Name: [name] [id number] Date: 08/01/2014 Reason for consult: Follow-up assessment   Maternal Data    Feeding    LATCH Score/Interventions                      Lactation Tools Discussed/Used     Consult Status Consult Status: Complete Follow-up type: Call as needed    Tonna Corner 08/01/2014, 11:48 AM

## 2014-08-01 NOTE — Discharge Instructions (Signed)

## 2014-08-01 NOTE — Discharge Summary (Signed)
Obstetric Discharge Summary Reason for Admission: induction of labor.  Pt admitted for IOL for chronic hypertension and A1GDM. Prenatal Procedures: NST and ultrasound Intrapartum Procedures: vacuum assisted VBAC Postpartum Procedures: 24 hr Magnesium Sulfate Complications-Operative and Postpartum: bilateral labial lacerations. HEMOGLOBIN  Date Value Ref Range Status  07/30/2014 10.7* 12.0 - 15.0 g/dL Final   HCT  Date Value Ref Range Status  07/30/2014 32.5* 36.0 - 46.0 % Final   Subjective: No problems or concerns. Patient states doing well. Reports minimal bleeding and pain controlled with pain medications. Denies calf pain.  Breastfeeding.    Physical Exam:  General: alert, cooperative and appears stated age Lochia: appropriate Uterine Fundus: firm Incision: n/a DVT Evaluation: No evidence of DVT seen on physical exam. Negative Homan's sign.  Discharge Diagnoses: Term Pregnancy-delivered and Chronic hypertension  Discharge Information: Date: 08/01/2014 Activity: pelvic rest Diet: routine Medications: Ibuprofen and Norvasc Condition: stable Instructions: refer to practice specific booklet Discharge to: home Follow-up Information    Follow up with [facility]. Schedule an appointment as soon as possible for a visit in 2 weeks.   Why:  Blood pressure check      Newborn Data: Live born female  Birth Weight: 7 lb 9.2 oz (3435 g) APGAR: 7, 9  Home with mother.  Kathrine Haddock N 08/01/2014, 7:35 AM

## 2014-08-03 ENCOUNTER — Encounter: Payer: BC Managed Care – PPO | Admitting: Obstetrics & Gynecology

## 2014-08-17 ENCOUNTER — Ambulatory Visit (INDEPENDENT_AMBULATORY_CARE_PROVIDER_SITE_OTHER): Payer: BLUE CROSS/BLUE SHIELD | Admitting: Obstetrics & Gynecology

## 2014-08-17 ENCOUNTER — Encounter: Payer: Self-pay | Admitting: Obstetrics & Gynecology

## 2014-08-17 VITALS — BP 159/105 | HR 108 | Resp 16 | Ht 69.0 in

## 2014-08-17 DIAGNOSIS — O10013 Pre-existing essential hypertension complicating pregnancy, third trimester: Secondary | ICD-10-CM

## 2014-08-17 DIAGNOSIS — R51 Headache: Secondary | ICD-10-CM

## 2014-08-17 DIAGNOSIS — O1093 Unspecified pre-existing hypertension complicating the puerperium: Secondary | ICD-10-CM

## 2014-08-17 MED ORDER — METOPROLOL TARTRATE 25 MG PO TABS: 25.0000 mg | ORAL_TABLET | Freq: Two times a day (BID) | ORAL | Status: DC

## 2014-08-17 MED ORDER — NORETHINDRONE 0.35 MG PO TABS: 1.0000 | ORAL_TABLET | Freq: Every day | ORAL | Status: DC

## 2014-08-17 NOTE — Progress Notes (Signed)
Subjective:     Patient ID: [name], female   DOB: Jan 26, 1986, 29 y.o.   MRN: [id number]  [id number] S/P VBAC, BP check. Breastfeeding.   Review of Systems  Constitutional: Negative.   Respiratory: Negative.   Genitourinary: Negative for menstrual problem and pelvic pain.  Neurological: Positive for headaches.       Objective:   Physical Exam  Constitutional: She appears well-developed.  Pulmonary/Chest: Effort normal.  Psychiatric: She has a normal mood and affect. Her behavior is normal.  Vitals reviewed.      Assessment:     PP HTN needs medication     Plan:     Metoprolol 25 mg BID, micronor, RTC pp check  Woodroe Mode, MD 08/17/2014

## 2014-09-07 ENCOUNTER — Ambulatory Visit: Payer: Self-pay | Admitting: Obstetrics & Gynecology

## 2014-09-23 ENCOUNTER — Ambulatory Visit: Payer: Self-pay | Admitting: Women's Health

## 2014-10-19 ENCOUNTER — Encounter: Payer: Self-pay | Admitting: Obstetrics & Gynecology

## 2014-10-19 ENCOUNTER — Ambulatory Visit (INDEPENDENT_AMBULATORY_CARE_PROVIDER_SITE_OTHER): Payer: BLUE CROSS/BLUE SHIELD | Admitting: Obstetrics & Gynecology

## 2014-10-19 VITALS — Ht 70.0 in | Wt 321.0 lb

## 2014-10-19 DIAGNOSIS — Z3041 Encounter for surveillance of contraceptive pills: Secondary | ICD-10-CM

## 2014-10-19 DIAGNOSIS — O24419 Gestational diabetes mellitus in pregnancy, unspecified control: Secondary | ICD-10-CM

## 2014-10-19 DIAGNOSIS — O24430 Gestational diabetes mellitus in the puerperium, diet controlled: Secondary | ICD-10-CM

## 2014-10-19 MED ORDER — LEVONORGESTREL-ETHINYL ESTRAD 0.15-30 MG-MCG PO TABS: 1.0000 | ORAL_TABLET | Freq: Every day | ORAL | Status: DC

## 2014-10-19 NOTE — Progress Notes (Signed)
Patient ID: Carolynne Edouard, female   DOB: 06/13/1986, 29 y.o.   MRN: [id number] Post Partum Exam  XOCHILTH BRUZEK is a 29 y.o. female G3P2 who presents for a postpartum visit. She is 11 weeks postpartum following a spontaneous vaginal delivery. I have fully reviewed the prenatal and intrapartum course. The delivery was at [redacted]w[redacted]d gestational weeks. Outcome: spontaneous vaginal delivery. Anesthesia: epidural. Postpartum course has been unremarkable. 44 course has been positive for pyloric stenosis, surgery 09/04/14 at [redacted]w[redacted]d old. Baby is feeding by bottle - Carnation Good Start DHA and ARA and Enfamil AR. Bleeding no bleeding. Bowel function is normal. Bladder function is normal. Patient is sexually active. Contraception method is Progestin only pills Postpartum depression screening: negative.  The following portions of the patient's history were reviewed and updated as appropriate: allergies, current medications, past family history, past medical history, past social history, past surgical history and problem list.  Review of Systems A comprehensive review of systems was negative.   Objective:    BP 116/78 mmHg  Pulse 78  Resp 16  Ht 5\' 5"  (1.651 m)  Wt 211 lb (95.709 kg)  BMI 35.11 kg/m2  Breastfeeding? Yes  Pt in NAD exam deferred Assessment:    3 months postpartum exam. Pap smear not done at today's visit.   Plan:    1. Contraception: OCP (estrogen/progesterone) [place] 2. Pt needs 2 hour GTT- will order today 3. Follow up in: 1 year or as needed.

## 2014-10-27 LAB — GLUCOSE TOLERANCE, 2 HOURS
Glucose, 2 hour: 117 mg/dL (ref 70–139)
Glucose, Fasting: 87 mg/dL (ref 70–99)

## 2014-11-02 ENCOUNTER — Telehealth: Payer: Self-pay | Admitting: *Deleted

## 2014-11-02 NOTE — Telephone Encounter (Signed)
LMOM GTT nrml

## 2014-11-02 NOTE — Telephone Encounter (Signed)
-----   Message from Lavonia Drafts, MD sent at 10/28/2014 12:09 PM EDT ----- Please call and notify pt that her 2 hour GTT is normal.  Thx, clh-S

## 2015-02-04 IMAGING — US US OB LIMITED
1 series · 12 of 12 positions shown · non-contrast
Comparison: none

[Series 1: us ob limited · 0.30mm/px · 12 of 12 slices shown]
[im 1/12]
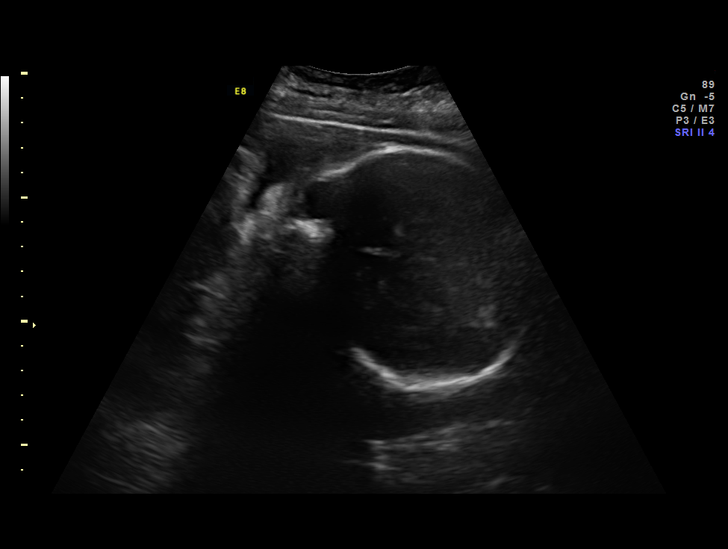
[im 2/12]
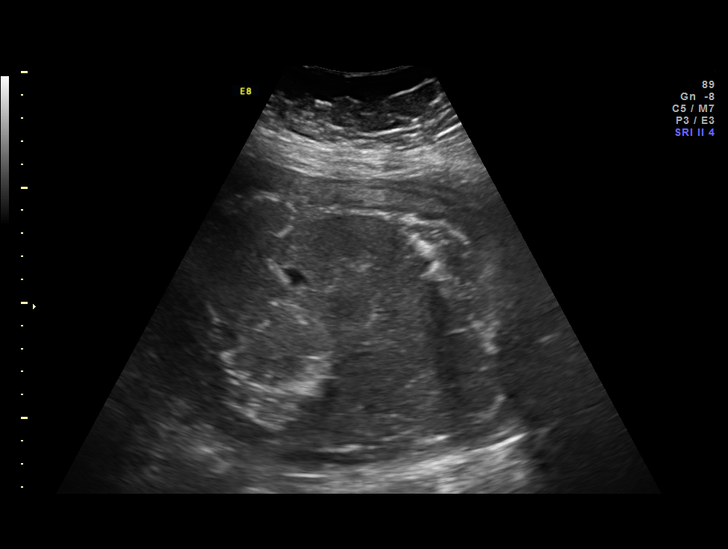
[im 3/12]
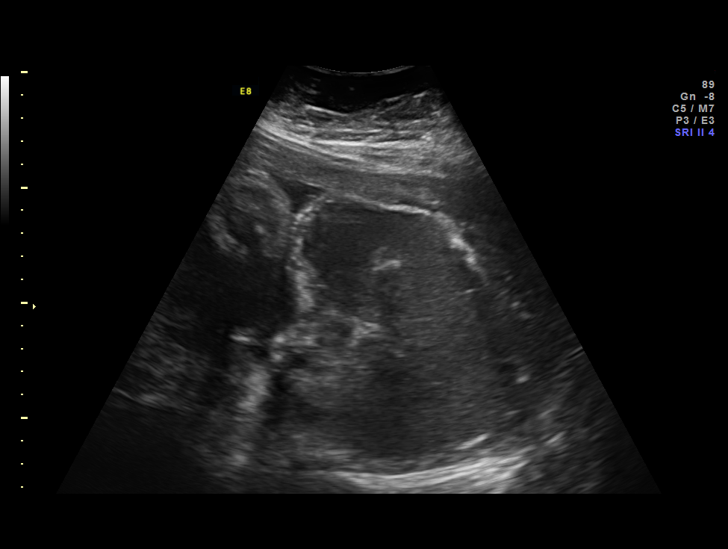
[im 4/12]
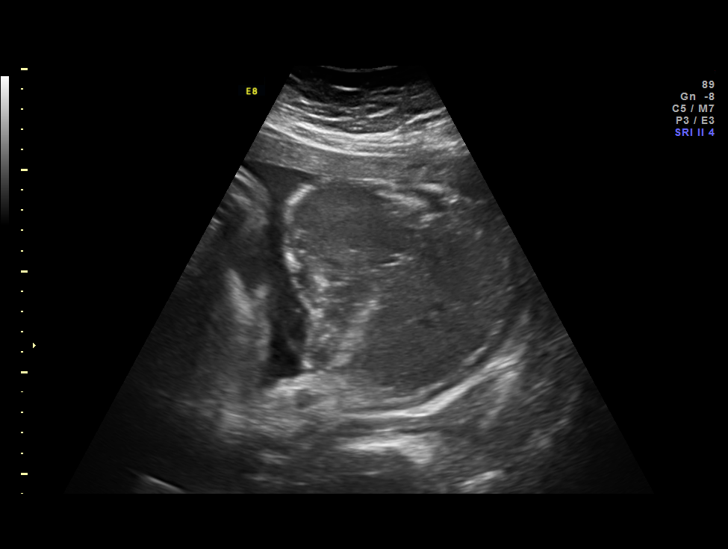
[im 5/12]
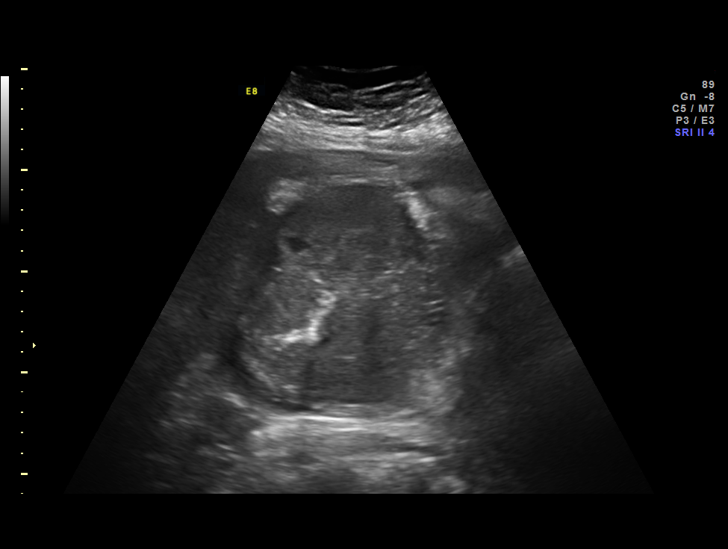
[im 6/12]
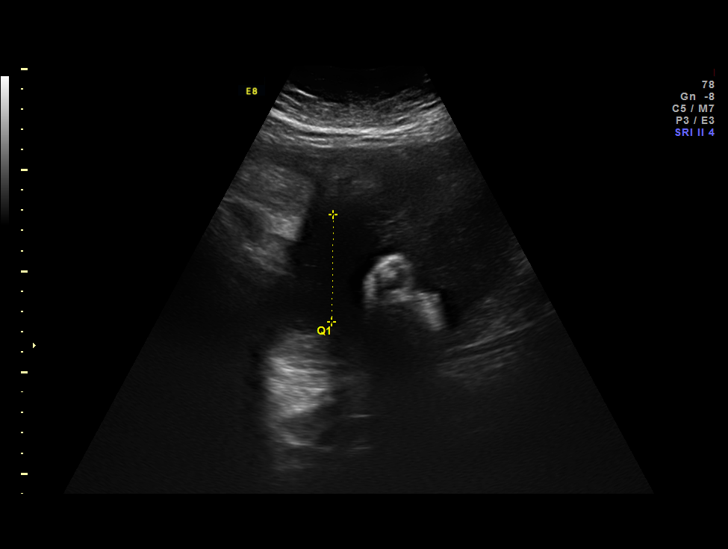
[im 7/12]
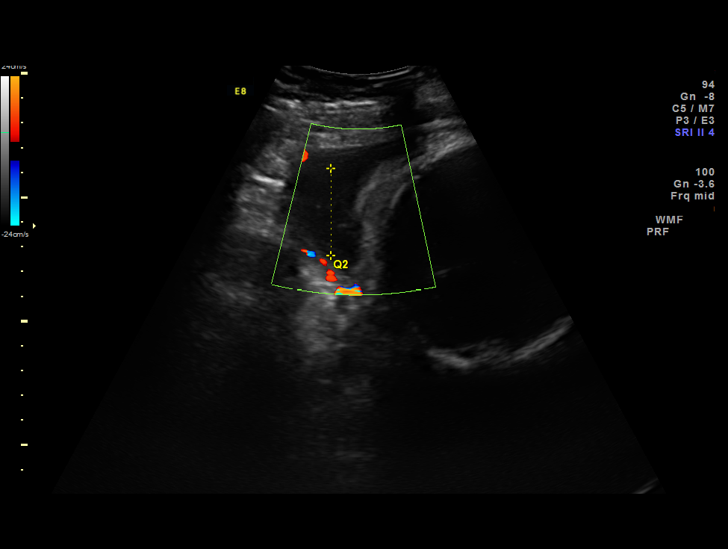
[im 8/12]
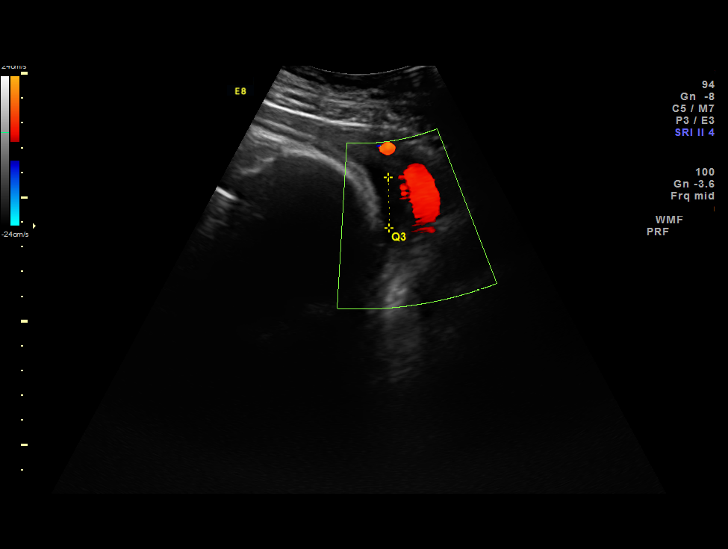
[im 9/12]
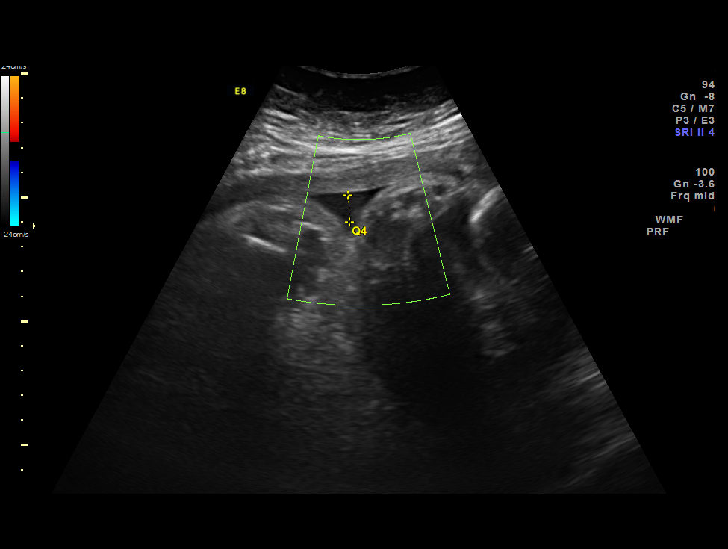
[im 10/12]
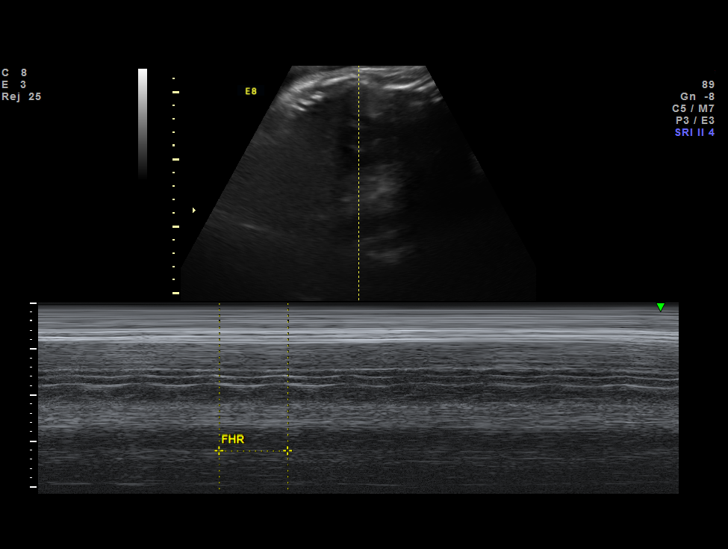
[im 11/12]
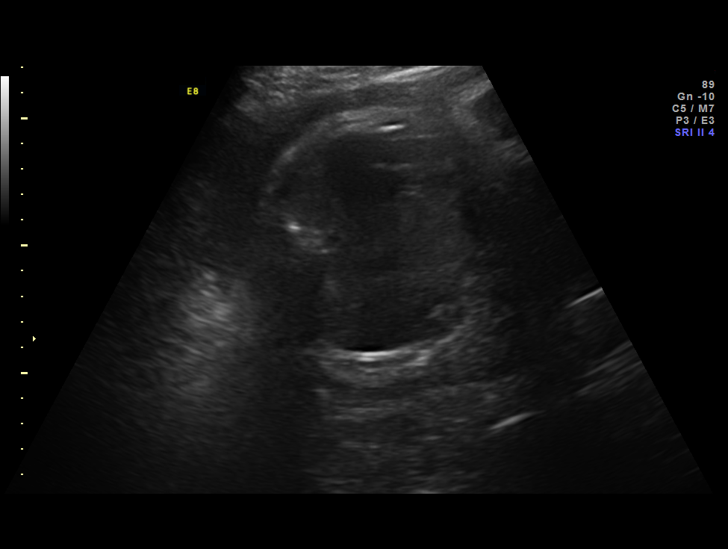
[im 12/12]
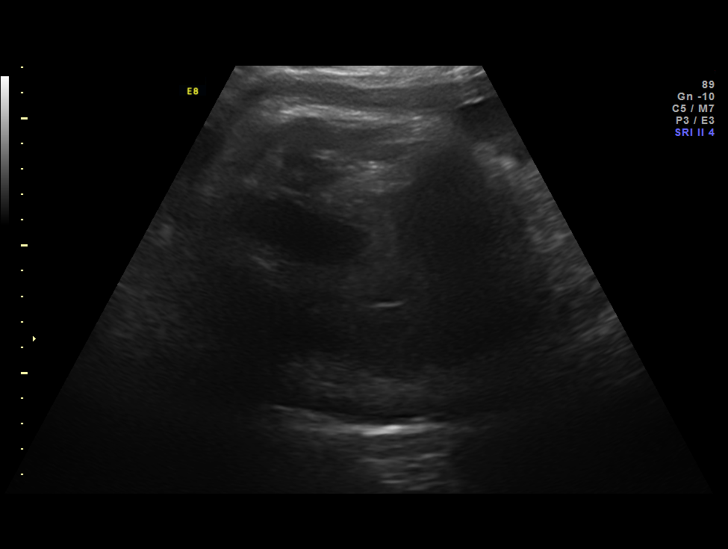

[12 of 12 positions shown; findings below may reference images not displayed]

OBSTETRICS REPORT
                      (Signed Final 07/16/2014 [DATE])

Service(s) Provided

 [HOSPITAL]                                         76815.0
Indications

 Pre-existing essential hypertension complicating
 pregnancy, third trimester on labetalol
 Maternal morbid obesity (343)
 History of cesarean delivery, currently pregnant
 Uterine scar, other than C/S (tummy tuck, excised
 endometriosis)
 Poor obstetric history: Previous preeclampsia /
 eclampsia/gestational HTN
 Gestational diabetes in pregnancy, diet controlled
 37 weeks gestation of pregnancy
Fetal Evaluation

 Num Of Fetuses:    1
 Fetal Heart Rate:  147                          bpm
 Cardiac Activity:  Observed
 Presentation:      Cephalic
 Placenta:          Posterior, above cervical
                    os
 P. Cord            Previously Visualized
 Insertion:

 Amniotic Fluid
 AFI FV:      Subjectively within normal limits
 AFI Sum:     11.96   cm       38  %Tile     Larg Pckt:     5.3  cm
 RUQ:   5.3     cm   RLQ:    1.09   cm    LUQ:   3.52    cm   LLQ:    2.05   cm
Gestational Age

 LMP:           41w 3d        Date:  09/29/13                 EDD:   07/06/14
 Best:          37w 0d     Det. By:  Early Ultrasound         EDD:   08/06/14
                                     (12/22/13)
Cervix Uterus Adnexa

 Cervix:       Not visualized (advanced GA >78wks)
Impression

 Single IUP at 37w 0d
 Hx of CHTN on Labetalol, A1 Gestational diabetes
 Reactive NST.  Fetal breathing activity noted
 Normal amniotic fluid volume (11.9 cm)- normal modified BPP
Recommendations

 Continue antenatal testing as scheduled.
 Recommend delivery by 39 weeks gestation in the absence
 of other complications.

 questions or concerns.

## 2015-02-11 IMAGING — US US OB FOLLOW-UP
1 series · 12 of 26 positions shown · non-contrast
Comparison: none

[Series 1: us ob follow-up · 12 of 26 slices shown]
[im 2/26]
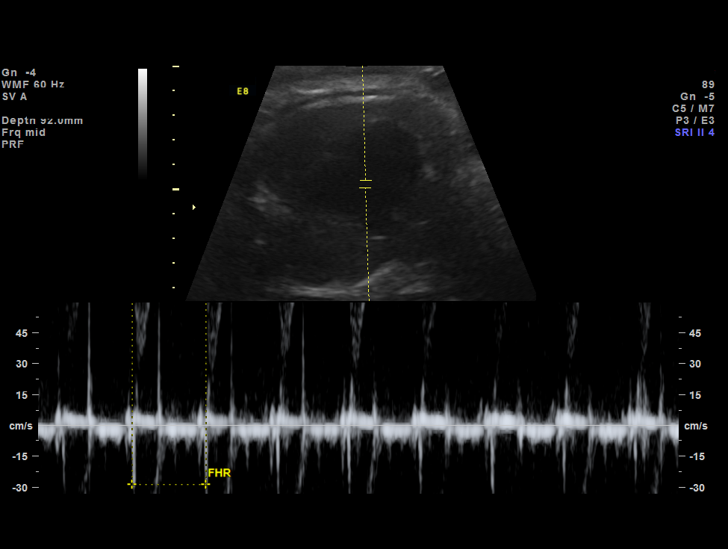
[im 4/26]
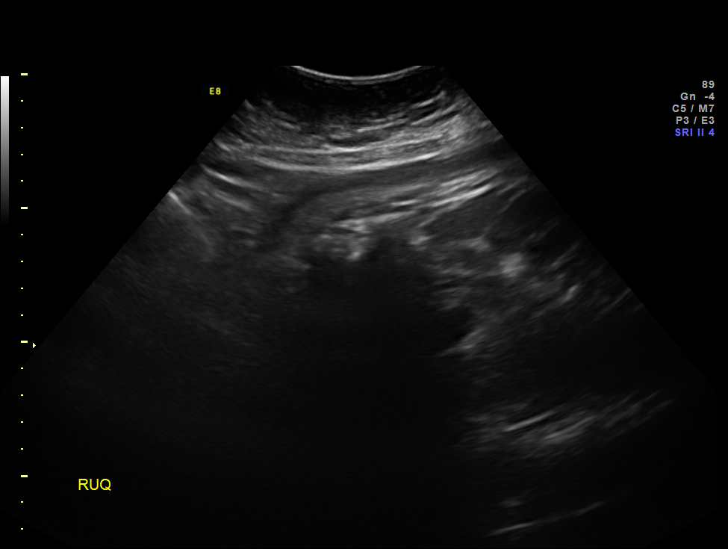
[im 6/26]
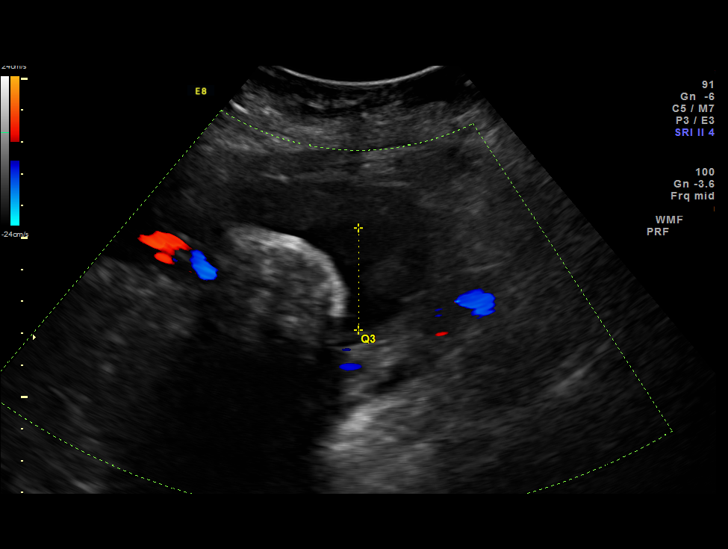
[im 8/26]
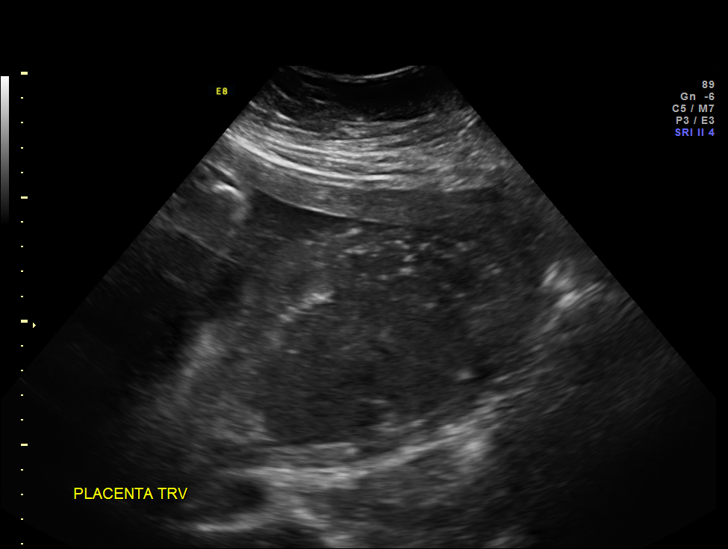
[im 10/26]
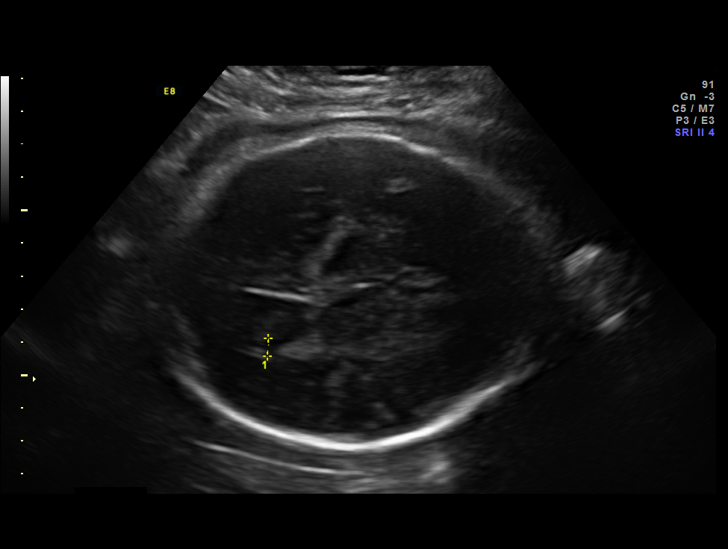
[im 12/26]
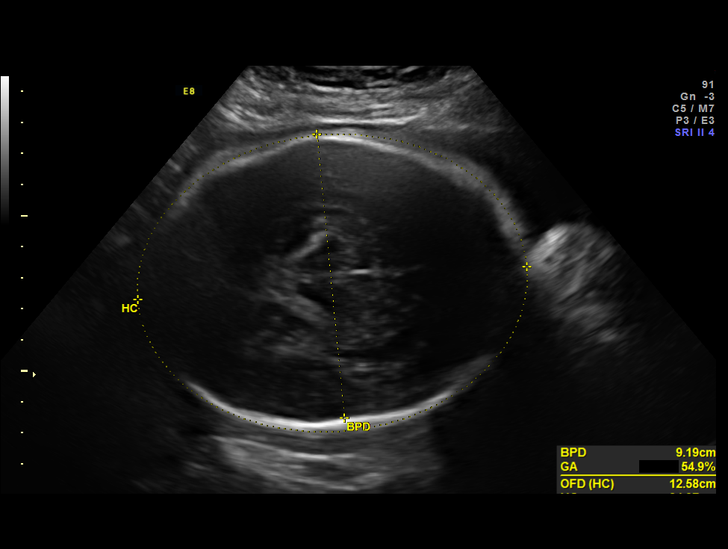
[im 15/26]
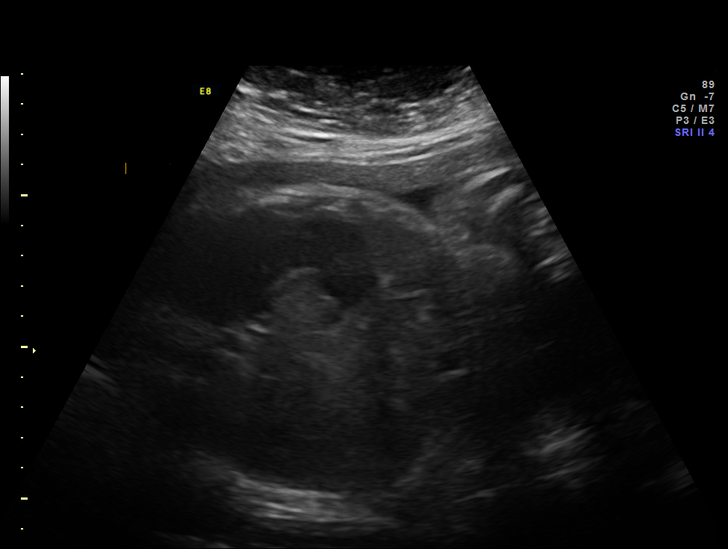
[im 17/26]
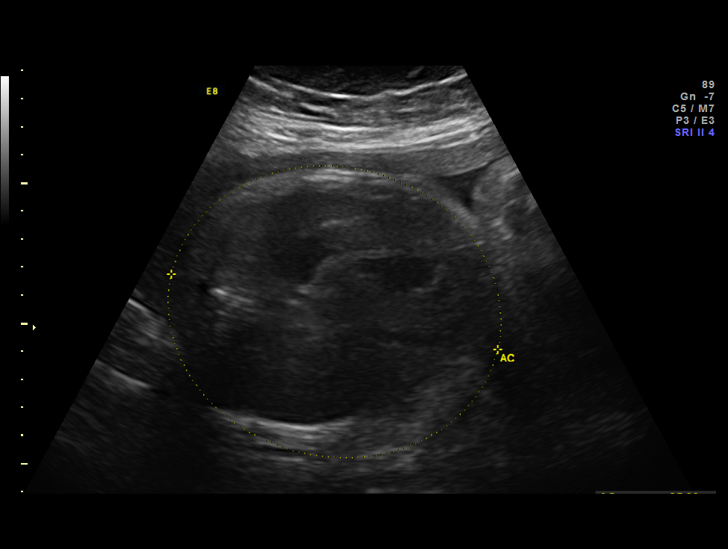
[im 19/26]
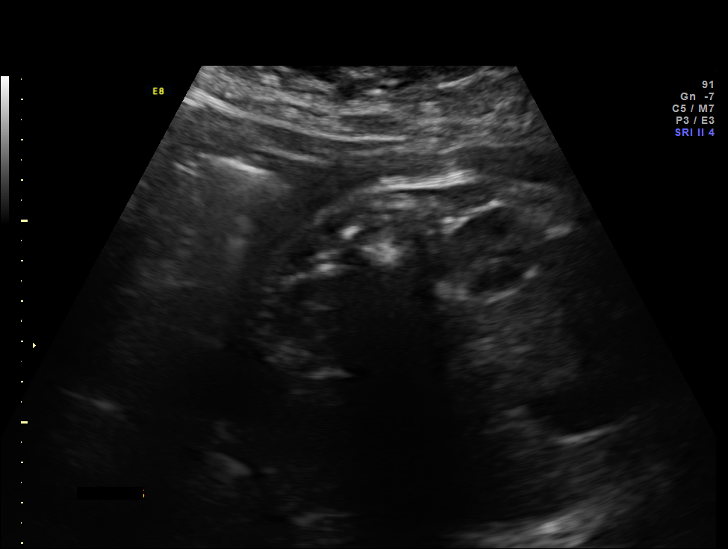
[im 21/26]
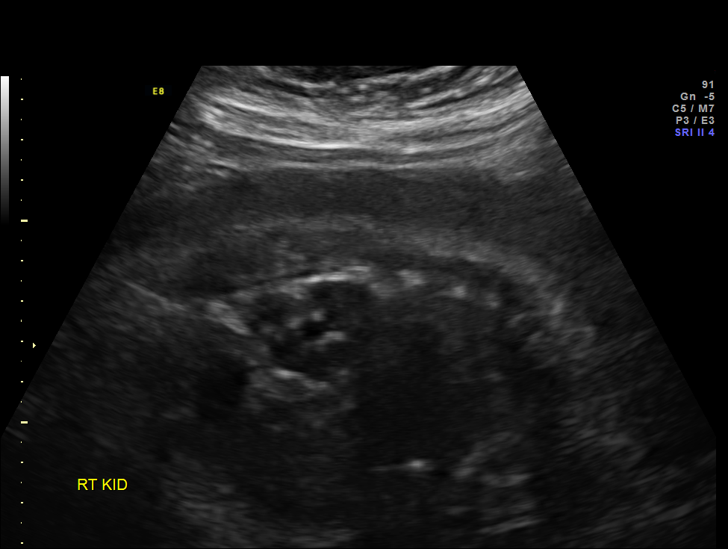
[im 23/26]
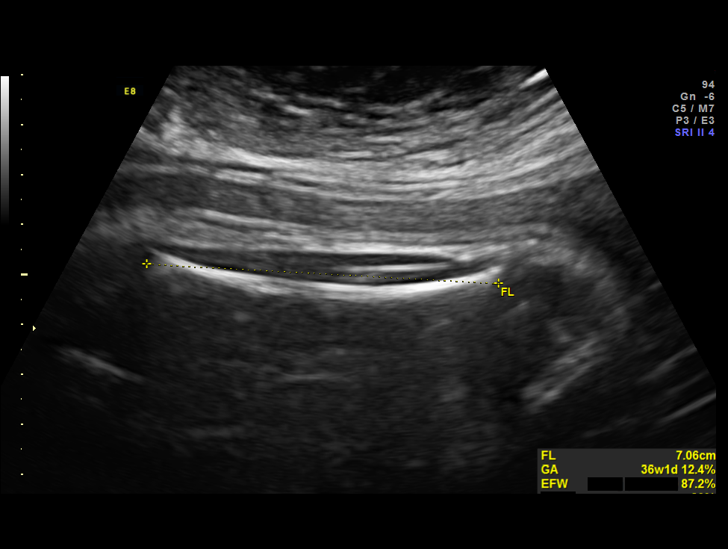
[im 25/26]
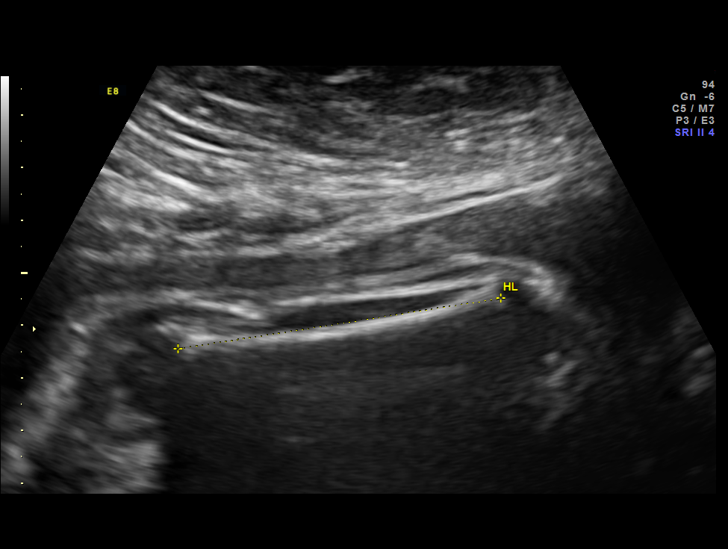

[12 of 26 positions shown; findings below may reference images not displayed]

OBSTETRICS REPORT
                      (Signed Final 07/24/2014 [DATE])

Service(s) Provided

 US OB FOLLOW UP                                       76816.1
Indications

 Pre-existing essential hypertension complicating
 pregnancy, third trimester on labetalol
 Maternal morbid obesity (351 lb)
 History of cesarean delivery, currently pregnant
 Uterine scar, other than C/S (tummy tuck, excised
 endometriosis)
 Poor obstetric history: Previous preeclampsia /
 eclampsia/gestational HTN
 Gestational diabetes in pregnancy, diet controlled
 38 weeks gestation of pregnancy
Fetal Evaluation

 Num Of Fetuses:    1
 Fetal Heart Rate:  136                          bpm
 Cardiac Activity:  Observed
 Presentation:      Cephalic
 Placenta:          Posterior, above cervical
                    os
 P. Cord            Previously Visualized
 Insertion:

 Amniotic Fluid
 AFI FV:      Subjectively within normal limits
 AFI Sum:     13.74    cm      52  %Tile      Larg Pckt:   6.92  cm
 RUQ:   6.92    cm   RLQ:    0      cm    LUQ:    3.62   cm    LLQ:   3.2     cm
Biophysical Evaluation

 N.S.T:          Reactive
Biometry

 BPD:     92.5   mm    G. Age:  37w 4d                CI:          74.0   70 - 86
 OFD:      125   mm                                   FL/HC:       19.9   20.9 -

 HC:     349.2   mm    G. Age:  40w 4d       86   %   HC/AC:       0.98   0.92 -

 AC:       355   mm    G. Age:  39w 3d       93   %   FL/BPD:      75.1   71 - 87
 FL:      69.5   mm    G. Age:  35w 5d         7  %   FL/AC:       19.6   20 - 24
 HUM:     62.5   mm    G. Age:  36w 2d       44   %
 Est. FW:    4233   gm    7 lb 12 oz     85  %
Gestational Age

 LMP:           42w 3d        Date:  09/29/13                 EDD:    07/06/14
 U/S Today:     38w 2d                                        EDD:    08/04/14
 Best:          38w 0d     Det. By:  Early Ultrasound         EDD:    08/06/14
                                     (12/22/13)
Anatomy

 Cranium:          Appears normal         Aortic Arch:       Previously seen
 Fetal Cavum:      Previously seen        Ductal Arch:       Previously seen
 Ventricles:       Appears normal         Diaphragm:         Previously seen
 Choroid Plexus:   Previously seen        Stomach:           Appears normal, left
                                                             sided
 Cerebellum:       Previously seen        Abdomen:           Appears normal
 Posterior Fossa:  Previously seen        Abdominal Wall:    Previously seen
 Nuchal Fold:      Previously seen        Cord Vessels:      Previously seen
 Face:             Orbits and profile     Kidneys:           Appear normal
                   previously seen
 Lips:             Previously seen        Bladder:           Appears normal
 Heart:            Previously seen        Spine:             Previously seen
 RVOT:             Previously seen        Lower              Previously seen
                                          Extremities:
 LVOT:             Previously seen        Upper              Previously seen
                                          Extremities:

 Other:  Female gender previously seen. Heels and 5th digit previously seen.
         Technically difficult due to maternal habitus and fetal position.
Cervix Uterus Adnexa

 Cervix:       Not visualized (advanced GA >97wks)
Impression

 SIUP at 38+0 weeks
 Normal interval anatomy; anatomic survey complete
 Normal amniotic fluid volume
 Appropriate interval growth with EFW at the 85th %tile
 NST reactive
Recommendations

 Continue twice weekly NSTs with weekly AFIs

 questions or concerns.

## 2015-05-17 ENCOUNTER — Other Ambulatory Visit: Payer: Self-pay | Admitting: Obstetrics and Gynecology

## 2015-05-17 DIAGNOSIS — O3680X0 Pregnancy with inconclusive fetal viability, not applicable or unspecified: Secondary | ICD-10-CM

## 2015-05-18 ENCOUNTER — Ambulatory Visit (INDEPENDENT_AMBULATORY_CARE_PROVIDER_SITE_OTHER): Payer: BLUE CROSS/BLUE SHIELD

## 2015-05-18 DIAGNOSIS — O3680X0 Pregnancy with inconclusive fetal viability, not applicable or unspecified: Secondary | ICD-10-CM

## 2015-05-18 NOTE — Progress Notes (Signed)
[place] 9+1wks,single IUP pos fht 167bpm,normal ov's bilat,crl 23.39mm

## 2015-05-26 ENCOUNTER — Ambulatory Visit (INDEPENDENT_AMBULATORY_CARE_PROVIDER_SITE_OTHER): Payer: BLUE CROSS/BLUE SHIELD | Admitting: Advanced Practice Midwife

## 2015-05-26 ENCOUNTER — Encounter: Payer: Self-pay | Admitting: Advanced Practice Midwife

## 2015-05-26 ENCOUNTER — Other Ambulatory Visit (HOSPITAL_COMMUNITY)
Admission: RE | Admit: 2015-05-26 | Discharge: 2015-05-26 | Disposition: A | Discharge status: Home / Self Care | Payer: BLUE CROSS/BLUE SHIELD | Source: Ambulatory Visit | Attending: Advanced Practice Midwife | Admitting: Advanced Practice Midwife

## 2015-05-26 VITALS — BP 132/84 | HR 91 | Wt 316.0 lb

## 2015-05-26 DIAGNOSIS — O09291 Supervision of pregnancy with other poor reproductive or obstetric history, first trimester: Secondary | ICD-10-CM

## 2015-05-26 DIAGNOSIS — O09899 Supervision of other high risk pregnancies, unspecified trimester: Secondary | ICD-10-CM | POA: Insufficient documentation

## 2015-05-26 DIAGNOSIS — Z331 Pregnant state, incidental: Secondary | ICD-10-CM

## 2015-05-26 DIAGNOSIS — Z0283 Encounter for blood-alcohol and blood-drug test: Secondary | ICD-10-CM

## 2015-05-26 DIAGNOSIS — Z98891 History of uterine scar from previous surgery: Secondary | ICD-10-CM

## 2015-05-26 DIAGNOSIS — Z124 Encounter for screening for malignant neoplasm of cervix: Secondary | ICD-10-CM

## 2015-05-26 DIAGNOSIS — O09891 Supervision of other high risk pregnancies, first trimester: Secondary | ICD-10-CM

## 2015-05-26 DIAGNOSIS — Z369 Encounter for antenatal screening, unspecified: Secondary | ICD-10-CM

## 2015-05-26 DIAGNOSIS — O10911 Unspecified pre-existing hypertension complicating pregnancy, first trimester: Secondary | ICD-10-CM

## 2015-05-26 DIAGNOSIS — Z23 Encounter for immunization: Secondary | ICD-10-CM

## 2015-05-26 DIAGNOSIS — Z3682 Encounter for antenatal screening for nuchal translucency: Secondary | ICD-10-CM

## 2015-05-26 DIAGNOSIS — Z01419 Encounter for gynecological examination (general) (routine) without abnormal findings: Secondary | ICD-10-CM | POA: Diagnosis not present

## 2015-05-26 DIAGNOSIS — Z1389 Encounter for screening for other disorder: Secondary | ICD-10-CM

## 2015-05-26 DIAGNOSIS — Z113 Encounter for screening for infections with a predominantly sexual mode of transmission: Secondary | ICD-10-CM | POA: Insufficient documentation

## 2015-05-26 DIAGNOSIS — Z8632 Personal history of gestational diabetes: Secondary | ICD-10-CM

## 2015-05-26 DIAGNOSIS — O10913 Unspecified pre-existing hypertension complicating pregnancy, third trimester: Secondary | ICD-10-CM

## 2015-05-26 HISTORY — DX: Supervision of other high risk pregnancies, unspecified trimester: O09.899

## 2015-05-26 LAB — POCT URINALYSIS DIPSTICK
Blood, UA: NEGATIVE
Glucose, UA: NEGATIVE
Ketones, UA: NEGATIVE
LEUKOCYTES UA: NEGATIVE
NITRITE UA: NEGATIVE

## 2015-05-26 MED ORDER — ASPIRIN 81 MG PO TABS: 81.0000 mg | ORAL_TABLET | Freq: Every day | ORAL | Status: DC

## 2015-05-26 NOTE — Patient Instructions (Addendum)
Start aspirin 81mg  (baby asprin) every day on 06/08/15  Come to [facility] to get your bloodwork and do your 2 hour sugar test: Turn in your 24 hour urine collection at this visit:   1. Before your test, do not eat or drink anything for 8-10 hours prior to your  appointment (a small amount of water is allowed and you may take any medicines you normally take). Be sure to drink lots of water the day before. 2. When you arrive, your blood will be drawn for a 'fasting' blood sugar level.  Then you will be given a sweetened carbonated beverage to drink. You should  complete drinking this beverage within five minutes. After finishing the  beverage, you will have your blood drawn exactly 1 and 2 hours later. Having  your blood drawn on time is an important part of this test. A total of three blood  samples will be done. 3. The test takes approximately 2  hours. During the test, do not have anything to  eat or drink. Do not smoke, chew gum (not even sugarless gum) or use breath mints.  4. During the test you should remain close by and seated as much as possible and  avoid walking around. You may want to bring a book or something else to  occupy your time.  5. After your test, you may eat and drink as normal. You may want to bring a snack  to eat after the test is finished. Your provider will advise you as to the results of  this test and any follow-up if necessary          First Trimester of Pregnancy The first trimester of pregnancy is from week 1 until the end of week 12 (months 1 through 3). A week after a sperm fertilizes an egg, the egg will implant on the wall of the uterus. This embryo will begin to develop into a baby. Genes from you and your partner are forming the baby. The female genes determine whether the baby is a boy or a girl. At 6-8 weeks, the eyes and face are formed, and the heartbeat can be seen on ultrasound. At the end of 12 weeks, all the baby's organs are formed.  Now that  you are pregnant, you will want to do everything you can to have a healthy baby. Two of the most important things are to get good prenatal care and to follow your health care provider's instructions. Prenatal care is all the medical care you receive before the baby's birth. This care will help prevent, find, and treat any problems during the pregnancy and childbirth. BODY CHANGES Your body goes through many changes during pregnancy. The changes vary from woman to woman.   You may gain or lose a couple of pounds at first.  You may feel sick to your stomach (nauseous) and throw up (vomit). If the vomiting is uncontrollable, call your health care provider.  You may tire easily.  You may develop headaches that can be relieved by medicines approved by your health care provider.  You may urinate more often. Painful urination may mean you have a bladder infection.  You may develop heartburn as a result of your pregnancy.  You may develop constipation because certain hormones are causing the muscles that push waste through your intestines to slow down.  You may develop hemorrhoids or swollen, bulging veins (varicose veins).  Your breasts may begin to grow larger and become tender. Your nipples may stick out  more, and the tissue that surrounds them (areola) may become darker.  Your gums may bleed and may be sensitive to brushing and flossing.  Dark spots or blotches (chloasma, mask of pregnancy) may develop on your face. This will likely fade after the baby is born.  Your menstrual periods will stop.  You may have a loss of appetite.  You may develop cravings for certain kinds of food.  You may have changes in your emotions from day to day, such as being excited to be pregnant or being concerned that something may go wrong with the pregnancy and baby.  You may have more vivid and strange dreams.  You may have changes in your hair. These can include thickening of your hair, rapid growth,  and changes in texture. Some women also have hair loss during or after pregnancy, or hair that feels dry or thin. Your hair will most likely return to normal after your baby is born. WHAT TO EXPECT AT YOUR PRENATAL VISITS During a routine prenatal visit:  You will be weighed to make sure you and the baby are growing normally.  Your blood pressure will be taken.  Your abdomen will be measured to track your baby's growth.  The fetal heartbeat will be listened to starting around week 10 or 12 of your pregnancy.  Test results from any previous visits will be discussed. Your health care provider may ask you:  How you are feeling.  If you are feeling the baby move.  If you have had any abnormal symptoms, such as leaking fluid, bleeding, severe headaches, or abdominal cramping.  If you have any questions. Other tests that may be performed during your first trimester include:  Blood tests to find your blood type and to check for the presence of any previous infections. They will also be used to check for low iron levels (anemia) and Rh antibodies. Later in the pregnancy, blood tests for diabetes will be done along with other tests if problems develop.  Urine tests to check for infections, diabetes, or protein in the urine.  An ultrasound to confirm the proper growth and development of the baby.  An amniocentesis to check for possible genetic problems.  Fetal screens for spina bifida and Down syndrome.  You may need other tests to make sure you and the baby are doing well. HOME CARE INSTRUCTIONS  Medicines  Follow your health care provider's instructions regarding medicine use. Specific medicines may be either safe or unsafe to take during pregnancy.  Take your prenatal vitamins as directed.  If you develop constipation, try taking a stool softener if your health care provider approves. Diet  Eat regular, well-balanced meals. Choose a variety of foods, such as meat or  vegetable-based protein, fish, milk and low-fat dairy products, vegetables, fruits, and whole grain breads and cereals. Your health care provider will help you determine the amount of weight gain that is right for you.  Avoid raw meat and uncooked cheese. These carry germs that can cause birth defects in the baby.  Eating four or five small meals rather than three large meals a day may help relieve nausea and vomiting. If you start to feel nauseous, eating a few soda crackers can be helpful. Drinking liquids between meals instead of during meals also seems to help nausea and vomiting.  If you develop constipation, eat more high-fiber foods, such as fresh vegetables or fruit and whole grains. Drink enough fluids to keep your urine clear or pale yellow. Activity and  Exercise  Exercise only as directed by your health care provider. Exercising will help you:  Control your weight.  Stay in shape.  Be prepared for labor and delivery.  Experiencing pain or cramping in the lower abdomen or low back is a good sign that you should stop exercising. Check with your health care provider before continuing normal exercises.  Try to avoid standing for long periods of time. Move your legs often if you must stand in one place for a long time.  Avoid heavy lifting.  Wear low-heeled shoes, and practice good posture.  You may continue to have sex unless your health care provider directs you otherwise. Relief of Pain or Discomfort  Wear a good support bra for breast tenderness.   Take warm sitz baths to soothe any pain or discomfort caused by hemorrhoids. Use hemorrhoid cream if your health care provider approves.   Rest with your legs elevated if you have leg cramps or low back pain.  If you develop varicose veins in your legs, wear support hose. Elevate your feet for 15 minutes, 3-4 times a day. Limit salt in your diet. Prenatal Care  Schedule your prenatal visits by the twelfth week of pregnancy.  They are usually scheduled monthly at first, then more often in the last 2 months before delivery.  Write down your questions. Take them to your prenatal visits.  Keep all your prenatal visits as directed by your health care provider. Safety  Wear your seat belt at all times when driving.  Make a list of emergency phone numbers, including numbers for family, friends, the hospital, and police and fire departments. General Tips  Ask your health care provider for a referral to a local prenatal education class. Begin classes no later than at the beginning of month 6 of your pregnancy.  Ask for help if you have counseling or nutritional needs during pregnancy. Your health care provider can offer advice or refer you to specialists for help with various needs.  Do not use hot tubs, steam rooms, or saunas.  Do not douche or use tampons or scented sanitary pads.  Do not cross your legs for long periods of time.  Avoid cat litter boxes and soil used by cats. These carry germs that can cause birth defects in the baby and possibly loss of the fetus by miscarriage or stillbirth.  Avoid all smoking, herbs, alcohol, and medicines not prescribed by your health care provider. Chemicals in these affect the formation and growth of the baby.  Schedule a dentist appointment. At home, brush your teeth with a soft toothbrush and be gentle when you floss. SEEK MEDICAL CARE IF:   You have dizziness.  You have mild pelvic cramps, pelvic pressure, or nagging pain in the abdominal area.  You have persistent nausea, vomiting, or diarrhea.  You have a bad smelling vaginal discharge.  You have pain with urination.  You notice increased swelling in your face, hands, legs, or ankles. SEEK IMMEDIATE MEDICAL CARE IF:   You have a fever.  You are leaking fluid from your vagina.  You have spotting or bleeding from your vagina.  You have severe abdominal cramping or pain.  You have rapid weight gain or  loss.  You vomit blood or material that looks like coffee grounds.  You are exposed to [place] measles and have never had them.  You are exposed to fifth disease or chickenpox.  You develop a severe headache.  You have shortness of breath.  You have  any kind of trauma, such as from a fall or a car accident. Document Released: 07/18/2001 Document Revised: 12/08/2013 Document Reviewed: 06/03/2013 [facility] Patient Information 2015 [place], [place]. This information is not intended to replace advice given to you by your health care provider. Make sure you discuss any questions you have with your health care provider.   Nausea & Vomiting  Have saltine crackers or pretzels by your bed and eat a few bites before you raise your head out of bed in the morning  Eat small frequent meals throughout the day instead of large meals  Drink plenty of fluids throughout the day to stay hydrated, just don't drink a lot of fluids with your meals.  This can make your stomach fill up faster making you feel sick  Do not brush your teeth right after you eat  Products with real ginger are good for nausea, like ginger ale and ginger hard candy Make sure it says made with real ginger!  Sucking on sour candy like lemon heads is also good for nausea  If your prenatal vitamins make you nauseated, take them at night so you will sleep through the nausea  Sea Bands  If you feel like you need medicine for the nausea & vomiting please let us know  If you are unable to keep any fluids or food down please let us know   Constipation  Drink plenty of fluid, preferably water, throughout the day  Eat foods high in fiber such as fruits, vegetables, and grains  Exercise, such as walking, is a good way to keep your bowels regular  Drink warm fluids, especially warm prune juice, or decaf coffee  Eat a 1/2 cup of real oatmeal (not instant), 1/2 cup applesauce, and 1/2-1 cup warm prune juice every day  If needed,  you may take Colace (docusate sodium) stool softener once or twice a day to help keep the stool soft. If you are pregnant, wait until you are out of your first trimester (12-14 weeks of pregnancy)  If you still are having problems with constipation, you may take Miralax once daily as needed to help keep your bowels regular.  If you are pregnant, wait until you are out of your first trimester (12-14 weeks of pregnancy)  Safe Medications in Pregnancy   Acne: Benzoyl Peroxide Salicylic Acid  Backache/Headache: Tylenol: 2 regular strength every 4 hours OR              2 Extra strength every 6 hours  Colds/Coughs/Allergies: Benadryl (alcohol free) 25 mg every 6 hours as needed Breath right strips Claritin Cepacol throat lozenges Chloraseptic throat spray Cold-Eeze- up to three times per day Cough drops, alcohol free Flonase (by prescription only) Guaifenesin Mucinex Robitussin DM (plain only, alcohol free) Saline nasal spray/drops Sudafed (pseudoephedrine) & Actifed ** use only after [redacted] weeks gestation and if you do not have high blood pressure Tylenol Vicks Vaporub Zinc lozenges Zyrtec   Constipation: Colace Ducolax suppositories Fleet enema Glycerin suppositories Metamucil Milk of magnesia Miralax Senokot Smooth move tea  Diarrhea: Kaopectate Imodium A-D  *NO pepto Bismol  Hemorrhoids: Anusol Anusol HC Preparation H Tucks  Indigestion: Tums Maalox Mylanta Zantac  Pepcid  Insomnia: Benadryl (alcohol free) 25mg  every 6 hours as needed Tylenol PM Unisom, no Gelcaps  Leg Cramps: Tums MagGel  Nausea/Vomiting:  Bonine Dramamine Emetrol Ginger extract Sea bands Meclizine  Nausea medication to take during pregnancy:  Unisom (doxylamine succinate 25 mg tablets) Take one tablet daily at bedtime. If symptoms  are not adequately controlled, the dose can be increased to a maximum recommended dose of two tablets daily (1/2 tablet in the morning, 1/2  tablet mid-afternoon and one at bedtime). Vitamin B6 100mg  tablets. Take one tablet twice a day (up to 200 mg per day).  Skin Rashes: Aveeno products Benadryl cream or 25mg  every 6 hours as needed Calamine Lotion 1% cortisone cream  Yeast infection: Gyne-lotrimin 7 Monistat 7   **If taking multiple medications, please check labels to avoid duplicating the same active ingredients **take medication as directed on the label ** Do not exceed 4000 mg of tylenol in 24 hours **Do not take medications that contain aspirin or ibuprofen

## 2015-05-26 NOTE — Progress Notes (Signed)
/  Subjective:    [name] is a [id number] [redacted]w[redacted]d being seen today for her first obstetrical visit.  Her obstetrical history is significant for A1DM.  She has also had HTN with every pregnancy, even in the beginning.  Last pg started on meds < 20 weeks  However, pt states her bp is very normal when she is not pregnant.  Not on meds. Pregnancy history fully reviewed. She had a CS for 2nd stage arrest and a successful VBAC.   Patient reports no complaints.  Filed Vitals:   05/26/15 1535  BP: 132/84  Pulse: 91  Weight: 316 lb (143.337 kg)   : OB History  Gravida Para Term Preterm AB SAB TAB Ectopic Multiple Living  4 2 2  1 1    0 2    # Outcome Date GA Lbr Len/2nd Weight Sex Delivery Anes PTL Lv  4 Current           3 Term 07/30/14 [redacted]w[redacted]d  7 lb 9.2 oz (3.435 kg) F VBAC, Vacuum EPI  Y  2 SAB 03/07/13 [redacted]w[redacted]d         1 Term 10/16/08 [redacted]w[redacted]d    CS-Unspec        Past Medical History  Diagnosis Date  . Granuloma, skin     incisional pfannenstiel scar  . Anemia   . Preterm labor   . Hypertension   . Gestational diabetes mellitus, antepartum   . Gestational diabetes    Past Surgical History  Procedure Laterality Date  . Cesarean section    . Nasal sinus surgery  2007  . Tonsillectomy  2008  . Endometrial fulguration  07/07/11  . Abdominal surgery      TUMMY TUCK EXCISED ENDOMETRIOSIS   Family History  Problem Relation Age of Onset  . Diabetes Mother   . Hypertension Mother   . Heart disease Father   . Cancer Paternal Uncle   . Cancer Paternal Grandmother      Exam       Pelvic Exam:    Perineum: Normal Perineum   Vulva: normal   Vagina:  normal mucosa, normal discharge, no palpable nodules   Uterus Normal, Gravid, FH: 10     Cervix: Normal pap collected   Adnexa: Not palpable   Urinary:  urethral meatus normal    System:     Skin: normal coloration and turgor, no rashes    Neurologic: oriented, normal, normal mood   Extremities: normal strength, tone, and  muscle mass   HEENT PERRLA   Mouth/Teeth mucous membranes moist, normal dentition   Neck supple and no masses   Cardiovascular: regular rate and rhythm   Respiratory:  appears well, vitals normal, no respiratory distress, acyanotic   Abdomen: soft, non-tender;  FHR: 160us          Assessment:    Pregnancy: [id number] Patient Active Problem List   Diagnosis Date Noted  . History of gestational diabetes in prior pregnancy, currently pregnant in first trimester 05/26/2015  . History of VBAC 05/26/2015  . HTN in pregnancy, chronic   . Chronic hypertension in pregnancy   . Obesity affecting pregnancy, antepartum   . Obesity complicating pregnancy in third trimester   . Previous cesarean delivery, delivered, with or without mention of antepartum condition 01/20/2014  . Hx of preeclampsia, prior pregnancy, currently pregnant 01/20/2014  . Morbid obesity ([place]) 02/13/2013        Plan:    I feel compelled to treat as CHTN, no  meds  Initial labs deferred to next week when she gets early 2hr gtt (obesity, hx of A1DM) Turn in 24 hour urine for total protein Continue prenatal vitamins  Problem list reviewed and updated  Reviewed n/v relief measures and warning s/s to report  Reviewed recommended weight gain based on pre-gravid BMI  Encouraged well-balanced diet Genetic Screening discussed Integrated Screen: requested.  Ultrasound discussed; fetal survey: requested.  Follow up in 2 weeks for NT/IT.  CRESENZO-DISHMAN,Dontavius Keim

## 2015-05-28 LAB — URINE CULTURE

## 2015-05-31 ENCOUNTER — Other Ambulatory Visit: Payer: BLUE CROSS/BLUE SHIELD

## 2015-05-31 LAB — CYTOLOGY - PAP

## 2015-06-01 LAB — GLUCOSE TOLERANCE, 2 HOURS W/ 1HR
Glucose, 1 hour: 143 mg/dL (ref 65–179)
Glucose, 2 hour: 81 mg/dL (ref 65–152)
Glucose, Fasting: 92 mg/dL — ABNORMAL HIGH (ref 65–91)

## 2015-06-01 LAB — PROTEIN, URINE, 24 HOUR
Protein, 24H Urine: 125 mg/24 hr (ref 30.0–150.0)
Protein, Ur: 10 mg/dL

## 2015-06-03 ENCOUNTER — Other Ambulatory Visit: Payer: Self-pay | Admitting: Advanced Practice Midwife

## 2015-06-03 ENCOUNTER — Encounter: Payer: Self-pay | Admitting: Advanced Practice Midwife

## 2015-06-03 DIAGNOSIS — O24410 Gestational diabetes mellitus in pregnancy, diet controlled: Secondary | ICD-10-CM

## 2015-06-08 LAB — URINALYSIS, ROUTINE W REFLEX MICROSCOPIC
BILIRUBIN UA: NEGATIVE
Glucose, UA: NEGATIVE
Ketones, UA: NEGATIVE
Nitrite, UA: NEGATIVE
RBC, UA: NEGATIVE
Specific Gravity, UA: 1.022 (ref 1.005–1.030)
Urobilinogen, Ur: 0.2 mg/dL (ref 0.2–1.0)
pH, UA: 6.5 (ref 5.0–7.5)

## 2015-06-08 LAB — ABO/RH: Rh Factor: POSITIVE

## 2015-06-08 LAB — CBC
HEMOGLOBIN: 11.5 g/dL (ref 11.1–15.9)
Hematocrit: 36.5 % (ref 34.0–46.6)
MCH: 24.9 pg — AB (ref 26.6–33.0)
MCHC: 31.5 g/dL (ref 31.5–35.7)
MCV: 79 fL (ref 79–97)
Platelets: 221 10*3/uL (ref 150–379)
RBC: 4.61 x10E6/uL (ref 3.77–5.28)
RDW: 15.7 % — ABNORMAL HIGH (ref 12.3–15.4)
WBC: 8.4 10*3/uL (ref 3.4–10.8)

## 2015-06-08 LAB — PMP SCREEN PROFILE (10S), URINE
AMPHETAMINE SCRN UR: NEGATIVE ng/mL
Barbiturate Screen, Ur: NEGATIVE ng/mL
Benzodiazepine Screen, Urine: NEGATIVE ng/mL
CANNABINOIDS UR QL SCN: NEGATIVE ng/mL
COCAINE(METAB.) SCREEN, URINE: NEGATIVE ng/mL
Creatinine(Crt), U: 147.5 mg/dL (ref 20.0–300.0)
Methadone Scn, Ur: NEGATIVE ng/mL
OPIATE SCRN UR: NEGATIVE ng/mL
OXYCODONE+OXYMORPHONE UR QL SCN: NEGATIVE ng/mL
PCP SCRN UR: NEGATIVE ng/mL
PH UR, DRUG SCRN: 6.1 (ref 4.5–8.9)
Propoxyphene, Screen: NEGATIVE ng/mL

## 2015-06-08 LAB — RPR: RPR: NONREACTIVE

## 2015-06-08 LAB — CYSTIC FIBROSIS MUTATION 97

## 2015-06-08 LAB — MICROSCOPIC EXAMINATION: CASTS: NONE SEEN /LPF

## 2015-06-08 LAB — HIV ANTIBODY (ROUTINE TESTING W REFLEX): HIV Screen 4th Generation wRfx: NONREACTIVE

## 2015-06-08 LAB — ANTIBODY SCREEN: Antibody Screen: NEGATIVE

## 2015-06-08 LAB — RUBELLA SCREEN: RUBELLA: 1.17 {index} (ref >0.99)

## 2015-06-08 LAB — HEPATITIS B SURFACE ANTIGEN: Hepatitis B Surface Ag: NEGATIVE

## 2015-06-08 LAB — VARICELLA ZOSTER ANTIBODY, IGG: VARICELLA: 673 {index} (ref >165)

## 2015-06-09 ENCOUNTER — Encounter: Payer: Self-pay | Admitting: Women's Health

## 2015-06-09 ENCOUNTER — Ambulatory Visit (INDEPENDENT_AMBULATORY_CARE_PROVIDER_SITE_OTHER): Payer: BLUE CROSS/BLUE SHIELD | Admitting: Women's Health

## 2015-06-09 ENCOUNTER — Ambulatory Visit (INDEPENDENT_AMBULATORY_CARE_PROVIDER_SITE_OTHER): Payer: BLUE CROSS/BLUE SHIELD

## 2015-06-09 VITALS — BP 128/78 | HR 84 | Wt 328.0 lb

## 2015-06-09 DIAGNOSIS — O24410 Gestational diabetes mellitus in pregnancy, diet controlled: Secondary | ICD-10-CM

## 2015-06-09 DIAGNOSIS — Z8632 Personal history of gestational diabetes: Principal | ICD-10-CM

## 2015-06-09 DIAGNOSIS — Z1389 Encounter for screening for other disorder: Secondary | ICD-10-CM

## 2015-06-09 DIAGNOSIS — Z98891 History of uterine scar from previous surgery: Secondary | ICD-10-CM

## 2015-06-09 DIAGNOSIS — Z0283 Encounter for blood-alcohol and blood-drug test: Secondary | ICD-10-CM

## 2015-06-09 DIAGNOSIS — O10911 Unspecified pre-existing hypertension complicating pregnancy, first trimester: Secondary | ICD-10-CM

## 2015-06-09 DIAGNOSIS — Z331 Pregnant state, incidental: Secondary | ICD-10-CM

## 2015-06-09 DIAGNOSIS — Z36 Encounter for antenatal screening of mother: Secondary | ICD-10-CM

## 2015-06-09 DIAGNOSIS — O09291 Supervision of pregnancy with other poor reproductive or obstetric history, first trimester: Secondary | ICD-10-CM

## 2015-06-09 DIAGNOSIS — O09891 Supervision of other high risk pregnancies, first trimester: Secondary | ICD-10-CM

## 2015-06-09 DIAGNOSIS — Z3682 Encounter for antenatal screening for nuchal translucency: Secondary | ICD-10-CM

## 2015-06-09 LAB — POCT URINALYSIS DIPSTICK
Glucose, UA: NEGATIVE
Ketones, UA: NEGATIVE
Leukocytes, UA: NEGATIVE
Nitrite, UA: NEGATIVE
Protein, UA: NEGATIVE
RBC UA: NEGATIVE

## 2015-06-09 NOTE — Progress Notes (Signed)
High Risk Pregnancy Diagnosis(es): CHTN, A1/B DM [id number] [redacted]w[redacted]d Estimated Date of Delivery: 12/20/15 BP 128/78 mmHg  Pulse 84  Wt 328 lb (148.78 kg)  LMP 03/15/2015 (Approximate)  Urinalysis: Negative HPI:  All sugars are normal BP, weight, and urine reviewed.  No fm yet. Denies cramping, lof, vb, uti s/s. No complaints.  Fundal Height:  12wks Fetal Heart rate:  171 u/s Edema:  none  Reviewed today's normal nt u/s, warning s/s to report. Let us know if cbg's above recommended limits All questions were answered Assessment: [redacted]w[redacted]d CHTN no meds, A1/B DM Medication(s) Plans:  Baby asa daily Treatment Plan:  U/s @ 20wks, then q4 Follow up in 4wks for high-risk OB appt and 2nd IT 1st IT/NT today

## 2015-06-09 NOTE — Patient Instructions (Signed)

## 2015-06-09 NOTE — Progress Notes (Signed)
[place] 12+2 wks,measurements c/w dates,normal ov's bilat,NT 1.80mm,NB present,CRL 64.mm,fhr 171 bpm,post pl gr 0

## 2015-06-10 ENCOUNTER — Encounter: Payer: Self-pay | Admitting: Advanced Practice Midwife

## 2015-06-10 DIAGNOSIS — Z141 Cystic fibrosis carrier: Secondary | ICD-10-CM | POA: Insufficient documentation

## 2015-06-12 LAB — MATERNAL SCREEN, INTEGRATED #1
CROWN RUMP LENGTH MAT SCREEN: 64 mm
GEST. AGE ON COLLECTION DATE: 12.7 wk
Maternal Age at EDD: 30 years
Nuchal Translucency (NT): 1.6 mm
Number of Fetuses: 1
PAPP-A Value: 579.4 ng/mL
Weight: 328 [lb_av]

## 2015-07-07 ENCOUNTER — Encounter: Payer: Self-pay | Admitting: Obstetrics and Gynecology

## 2015-07-07 ENCOUNTER — Ambulatory Visit (INDEPENDENT_AMBULATORY_CARE_PROVIDER_SITE_OTHER): Payer: BLUE CROSS/BLUE SHIELD | Admitting: Obstetrics and Gynecology

## 2015-07-07 VITALS — BP 148/90 | HR 110 | Wt 329.0 lb

## 2015-07-07 DIAGNOSIS — Z3A16 16 weeks gestation of pregnancy: Secondary | ICD-10-CM

## 2015-07-07 DIAGNOSIS — O99212 Obesity complicating pregnancy, second trimester: Secondary | ICD-10-CM

## 2015-07-07 DIAGNOSIS — Z331 Pregnant state, incidental: Secondary | ICD-10-CM

## 2015-07-07 DIAGNOSIS — O10912 Unspecified pre-existing hypertension complicating pregnancy, second trimester: Secondary | ICD-10-CM

## 2015-07-07 DIAGNOSIS — O09892 Supervision of other high risk pregnancies, second trimester: Secondary | ICD-10-CM

## 2015-07-07 DIAGNOSIS — O24410 Gestational diabetes mellitus in pregnancy, diet controlled: Secondary | ICD-10-CM

## 2015-07-07 DIAGNOSIS — Z1389 Encounter for screening for other disorder: Secondary | ICD-10-CM

## 2015-07-07 LAB — POCT URINALYSIS DIPSTICK
GLUCOSE UA: NEGATIVE
Ketones, UA: NEGATIVE
Leukocytes, UA: NEGATIVE
NITRITE UA: NEGATIVE

## 2015-07-07 NOTE — Progress Notes (Signed)
Pt denies any problems or concerns at this time.  

## 2015-07-07 NOTE — Progress Notes (Signed)
Patient ID: Carolynne Edouard, female   DOB: 04-Jul-1986, 29 y.o.   MRN: [id number]   High Risk Pregnancy Diagnosis(es):  Jamison Neighbor DM  [id number] [redacted]w[redacted]d Estimated Date of Delivery: 12/20/15     HPI: The patient is being seen today for ongoing management of HROB Today she has no complaints Patient reports good fetal movement, denies any bleeding and no rupture of membranes symptoms or regular contractions.   BP weight and urine results all reviewed and noted. Blood pressure 148/90, pulse 110, weight 329 lb (149.233 kg), last menstrual period 03/15/2015, unknown if currently breastfeeding.  Fetal Surveillance Testing today:  none Fundal Height:  N/A Fetal Heart rate:  159 Edema:  none Urinalysis: Positive for trace blood and trace protein   Questions were answered.  Lab and sonogram results have been reviewed. Comments: not examined   Assessment:  1.  Pregnancy at [redacted]w[redacted]d,  Estimated Date of Delivery: 12/20/15 :                          2.  CHTN, A1/B DM, high risk OB                       3   Morbid obesity   Medication(s) Plans:  none  Treatment Plan:   Follow up in 4 weeks for appointment for high risk OB care and Anatomy US  By signing my name below, I, Erling Conte, attest that this documentation has been prepared under the direction and in the presence of Jonnie Kind, MD. Electronically Signed: Erling Conte, ED Scribe. 07/07/2015. 4:16 PM.  I personally performed the services described in this documentation, which was SCRIBED in my presence. The recorded information has been reviewed and considered accurate. It has been edited as necessary during review. Jonnie Kind, MD

## 2015-08-03 ENCOUNTER — Other Ambulatory Visit: Payer: Self-pay | Admitting: Obstetrics and Gynecology

## 2015-08-03 DIAGNOSIS — Z1389 Encounter for screening for other disorder: Secondary | ICD-10-CM

## 2015-08-04 ENCOUNTER — Ambulatory Visit (INDEPENDENT_AMBULATORY_CARE_PROVIDER_SITE_OTHER): Payer: BLUE CROSS/BLUE SHIELD

## 2015-08-04 ENCOUNTER — Ambulatory Visit (INDEPENDENT_AMBULATORY_CARE_PROVIDER_SITE_OTHER): Payer: BLUE CROSS/BLUE SHIELD | Admitting: Women's Health

## 2015-08-04 ENCOUNTER — Encounter: Payer: Self-pay | Admitting: Women's Health

## 2015-08-04 VITALS — BP 142/78 | HR 88 | Wt 329.0 lb

## 2015-08-04 DIAGNOSIS — Z36 Encounter for antenatal screening of mother: Secondary | ICD-10-CM | POA: Diagnosis not present

## 2015-08-04 DIAGNOSIS — Z363 Encounter for antenatal screening for malformations: Secondary | ICD-10-CM

## 2015-08-04 DIAGNOSIS — Z3A20 20 weeks gestation of pregnancy: Secondary | ICD-10-CM

## 2015-08-04 DIAGNOSIS — Z3682 Encounter for antenatal screening for nuchal translucency: Secondary | ICD-10-CM

## 2015-08-04 DIAGNOSIS — O09892 Supervision of other high risk pregnancies, second trimester: Secondary | ICD-10-CM

## 2015-08-04 DIAGNOSIS — Z331 Pregnant state, incidental: Secondary | ICD-10-CM

## 2015-08-04 DIAGNOSIS — Z1389 Encounter for screening for other disorder: Secondary | ICD-10-CM

## 2015-08-04 DIAGNOSIS — O99212 Obesity complicating pregnancy, second trimester: Secondary | ICD-10-CM

## 2015-08-04 DIAGNOSIS — O24410 Gestational diabetes mellitus in pregnancy, diet controlled: Secondary | ICD-10-CM

## 2015-08-04 DIAGNOSIS — O10912 Unspecified pre-existing hypertension complicating pregnancy, second trimester: Secondary | ICD-10-CM

## 2015-08-04 LAB — POCT URINALYSIS DIPSTICK
Glucose, UA: NEGATIVE
Ketones, UA: NEGATIVE
Leukocytes, UA: NEGATIVE
NITRITE UA: NEGATIVE
Protein, UA: NEGATIVE

## 2015-08-04 MED ORDER — LABETALOL HCL 100 MG PO TABS: 100.0000 mg | ORAL_TABLET | Freq: Two times a day (BID) | ORAL | Status: DC

## 2015-08-04 NOTE — Progress Notes (Signed)
[place] 20+2wks,cx 3.4cm,post pl,normal ov's bilat,cephalic,efw [id number] g,fhr 0000000 of fluid 4.6cm,limited view of head,please have pt come back for additional images,no obvious abn seen

## 2015-08-04 NOTE — Progress Notes (Signed)
High Risk Pregnancy Diagnosis(es): CHTN, A1/B DM [id number] [redacted]w[redacted]d Estimated Date of Delivery: 12/20/15 BP 142/78 mmHg  Pulse 88  Wt 329 lb (149.233 kg)  LMP 03/15/2015 (Approximate)  Urinalysis: Negative HPI:   Sugars for last 30d: fbs 76-99 (8>90), 2hr pp 97-128 (10>120 all at lunch), states she hasn't been doing really good w/ her diet, esp at night and at lunch- will be stricter w/ diet BP, weight, and urine reviewed.  Reports good fm. Denies regular uc's, lof, vb, uti s/s. No complaints. States this was about the time she had to go on bp meds last pregnancy  Fundal Height:  20wks Fetal Heart rate:  151 u/s Edema:  none  Reviewed today's anatomy u/s, limited views of head- otherwise normal. Discussed warning s/s to report.  All questions were answered Assessment: [redacted]w[redacted]d CHTN, A1DM Medication(s) Plans:  Add Labetalol 100mg  BID- based on comorbidity w/ DM per UTD w/ sbp goal of 120-140/dbp goal of 80-90, continue baby asa Treatment Plan:  q4wk growth u/s, begin 2x/wk testing @ 32wks Follow up in 1wk to see how bp doing on meds/not too low and to see how sugars doing w/ stricter diet changes to determine if meds needed/ for high-risk OB appt, then 4wks for HROB and growth u/s  2nd IT today, didn't have at last visit

## 2015-08-04 NOTE — Patient Instructions (Signed)
Cut back on carbs at night and at lunch  Second Trimester of Pregnancy The second trimester is from week 13 through week 28, months 4 through 6. The second trimester is often a time when you feel your best. Your body has also adjusted to being pregnant, and you begin to feel better physically. Usually, morning sickness has lessened or quit completely, you may have more energy, and you may have an increase in appetite. The second trimester is also a time when the fetus is growing rapidly. At the end of the sixth month, the fetus is about 9 inches long and weighs about 1 pounds. You will likely begin to feel the baby move (quickening) between 18 and 20 weeks of the pregnancy. BODY CHANGES Your body goes through many changes during pregnancy. The changes vary from woman to woman.   Your weight will continue to increase. You will notice your lower abdomen bulging out.  You may begin to get stretch marks on your hips, abdomen, and breasts.  You may develop headaches that can be relieved by medicines approved by your health care provider.  You may urinate more often because the fetus is pressing on your bladder.  You may develop or continue to have heartburn as a result of your pregnancy.  You may develop constipation because certain hormones are causing the muscles that push waste through your intestines to slow down.  You may develop hemorrhoids or swollen, bulging veins (varicose veins).  You may have back pain because of the weight gain and pregnancy hormones relaxing your joints between the bones in your pelvis and as a result of a shift in weight and the muscles that support your balance.  Your breasts will continue to grow and be tender.  Your gums may bleed and may be sensitive to brushing and flossing.  Dark spots or blotches (chloasma, mask of pregnancy) may develop on your face. This will likely fade after the baby is born.  A dark line from your belly button to the pubic area  (linea nigra) may appear. This will likely fade after the baby is born.  You may have changes in your hair. These can include thickening of your hair, rapid growth, and changes in texture. Some women also have hair loss during or after pregnancy, or hair that feels dry or thin. Your hair will most likely return to normal after your baby is born. WHAT TO EXPECT AT YOUR PRENATAL VISITS During a routine prenatal visit:  You will be weighed to make sure you and the fetus are growing normally.  Your blood pressure will be taken.  Your abdomen will be measured to track your baby's growth.  The fetal heartbeat will be listened to.  Any test results from the previous visit will be discussed. Your health care provider may ask you:  How you are feeling.  If you are feeling the baby move.  If you have had any abnormal symptoms, such as leaking fluid, bleeding, severe headaches, or abdominal cramping.  If you are using any tobacco products, including cigarettes, chewing tobacco, and electronic cigarettes.  If you have any questions. Other tests that may be performed during your second trimester include:  Blood tests that check for:  Low iron levels (anemia).  Gestational diabetes (between 24 and 28 weeks).  Rh antibodies.  Urine tests to check for infections, diabetes, or protein in the urine.  An ultrasound to confirm the proper growth and development of the baby.  An amniocentesis to check  for possible genetic problems.  Fetal screens for spina bifida and Down syndrome.  HIV (human immunodeficiency virus) testing. Routine prenatal testing includes screening for HIV, unless you choose not to have this test. HOME CARE INSTRUCTIONS   Avoid all smoking, herbs, alcohol, and unprescribed drugs. These chemicals affect the formation and growth of the baby.  Do not use any tobacco products, including cigarettes, chewing tobacco, and electronic cigarettes. If you need help quitting, ask  your health care provider. You may receive counseling support and other resources to help you quit.  Follow your health care provider's instructions regarding medicine use. There are medicines that are either safe or unsafe to take during pregnancy.  Exercise only as directed by your health care provider. Experiencing uterine cramps is a good sign to stop exercising.  Continue to eat regular, healthy meals.  Wear a good support bra for breast tenderness.  Do not use hot tubs, steam rooms, or saunas.  Wear your seat belt at all times when driving.  Avoid raw meat, uncooked cheese, cat litter boxes, and soil used by cats. These carry germs that can cause birth defects in the baby.  Take your prenatal vitamins.  Take 1500-2000 mg of calcium daily starting at the 20th week of pregnancy until you deliver your baby.  Try taking a stool softener (if your health care provider approves) if you develop constipation. Eat more high-fiber foods, such as fresh vegetables or fruit and whole grains. Drink plenty of fluids to keep your urine clear or pale yellow.  Take warm sitz baths to soothe any pain or discomfort caused by hemorrhoids. Use hemorrhoid cream if your health care provider approves.  If you develop varicose veins, wear support hose. Elevate your feet for 15 minutes, 3-4 times a day. Limit salt in your diet.  Avoid heavy lifting, wear low heel shoes, and practice good posture.  Rest with your legs elevated if you have leg cramps or low back pain.  Visit your dentist if you have not gone yet during your pregnancy. Use a soft toothbrush to brush your teeth and be gentle when you floss.  A sexual relationship may be continued unless your health care provider directs you otherwise.  Continue to go to all your prenatal visits as directed by your health care provider. SEEK MEDICAL CARE IF:   You have dizziness.  You have mild pelvic cramps, pelvic pressure, or nagging pain in the  abdominal area.  You have persistent nausea, vomiting, or diarrhea.  You have a bad smelling vaginal discharge.  You have pain with urination. SEEK IMMEDIATE MEDICAL CARE IF:   You have a fever.  You are leaking fluid from your vagina.  You have spotting or bleeding from your vagina.  You have severe abdominal cramping or pain.  You have rapid weight gain or loss.  You have shortness of breath with chest pain.  You notice sudden or extreme swelling of your face, hands, ankles, feet, or legs.  You have not felt your baby move in over an hour.  You have severe headaches that do not go away with medicine.  You have vision changes.   This information is not intended to replace advice given to you by your health care provider. Make sure you discuss any questions you have with your health care provider.   Document Released: 07/18/2001 Document Revised: 08/14/2014 Document Reviewed: 09/24/2012 Elsevier Interactive Patient Education [employer].

## 2015-08-11 ENCOUNTER — Encounter: Payer: Self-pay | Admitting: Obstetrics and Gynecology

## 2015-08-11 ENCOUNTER — Ambulatory Visit (INDEPENDENT_AMBULATORY_CARE_PROVIDER_SITE_OTHER): Payer: BLUE CROSS/BLUE SHIELD | Admitting: Obstetrics and Gynecology

## 2015-08-11 VITALS — BP 152/92 | HR 108 | Wt 328.0 lb

## 2015-08-11 DIAGNOSIS — Z1389 Encounter for screening for other disorder: Secondary | ICD-10-CM

## 2015-08-11 DIAGNOSIS — O09892 Supervision of other high risk pregnancies, second trimester: Secondary | ICD-10-CM

## 2015-08-11 DIAGNOSIS — O10912 Unspecified pre-existing hypertension complicating pregnancy, second trimester: Secondary | ICD-10-CM

## 2015-08-11 DIAGNOSIS — Z331 Pregnant state, incidental: Secondary | ICD-10-CM

## 2015-08-11 LAB — POCT URINALYSIS DIPSTICK
Blood, UA: NEGATIVE
Glucose, UA: NEGATIVE
Ketones, UA: NEGATIVE
Leukocytes, UA: NEGATIVE
Nitrite, UA: NEGATIVE
PROTEIN UA: NEGATIVE

## 2015-08-11 MED ORDER — LABETALOL HCL 200 MG PO TABS: 200.0000 mg | ORAL_TABLET | Freq: Two times a day (BID) | ORAL | Status: DC

## 2015-08-11 NOTE — Progress Notes (Signed)
Pt denies any problems or concerns at this time.  

## 2015-08-11 NOTE — Progress Notes (Signed)
High Risk Pregnancy Diagnosis(es):   Lauretta Grill  [id number] [redacted]w[redacted]d Estimated Date of Delivery: 12/20/15     HPI: The patient is being seen today for ongoing management of chtn A1DM. Today she reports better cbg's since d/c of hs carbs Patient reports good fetal movement, denies any bleeding and no rupture of membranes symptoms or regular contractions.   BP weight and urine results all reviewed and noted. Blood pressure 152/92, pulse 108, weight 328 lb (148.78 kg), last menstrual period 03/15/2015, unknown if currently breastfeeding.  Fetal Surveillance Testing today:  none Fundal Height:  27 Fetal Heart rate:  134 Edema:  neg Urinalysis: Negative   Questions were answered.  Lab and sonogram results have been reviewed. Comments: normal cbg's normal for now. Bp slight increase  Assessment:  1.  Pregnancy at [redacted]w[redacted]d,  Estimated Date of Delivery: 12/20/15 :                          2.  chtn                        3.  A1DM  Medication(s) Plans:  Increase labetalol to 200 bid  Treatment Plan:  Per HROb protocol for htn, and A1 DM.  Follow up in 2+ weeks for appointment for high risk OB care, u/s growth plus bp check.

## 2015-08-13 LAB — MATERNAL SCREEN, INTEGRATED #2
AFP MoM: 1.05
Alpha-Fetoprotein: 31.5 ng/mL
Crown Rump Length: 64 mm
DIA MOM: 0.98
DIA VALUE: 172.9 pg/mL
Estriol, Unconjugated: 1.7 ng/mL
GEST. AGE ON COLLECTION DATE: 12.7 wk
Gestational Age: 21.7 weeks
HCG VALUE: 23.8 [IU]/mL
Maternal Age at EDD: 30 years
NUCHAL TRANSLUCENCY (NT): 1.6 mm
Nuchal Translucency MoM: 0.99
Number of Fetuses: 1
PAPP-A MoM: 1.52
PAPP-A VALUE: 579.4 ng/mL
Test Results:: NEGATIVE
Weight: 328 [lb_av]
Weight: 329 [lb_av]
hCG MoM: 2.38
uE3 MoM: 0.79

## 2015-08-31 ENCOUNTER — Other Ambulatory Visit: Payer: Self-pay | Admitting: Women's Health

## 2015-08-31 DIAGNOSIS — O09892 Supervision of other high risk pregnancies, second trimester: Secondary | ICD-10-CM

## 2015-08-31 DIAGNOSIS — Z0489 Encounter for examination and observation for other specified reasons: Secondary | ICD-10-CM

## 2015-08-31 DIAGNOSIS — IMO0002 Reserved for concepts with insufficient information to code with codable children: Secondary | ICD-10-CM

## 2015-08-31 DIAGNOSIS — O24410 Gestational diabetes mellitus in pregnancy, diet controlled: Secondary | ICD-10-CM

## 2015-08-31 DIAGNOSIS — O10912 Unspecified pre-existing hypertension complicating pregnancy, second trimester: Secondary | ICD-10-CM

## 2015-09-01 ENCOUNTER — Ambulatory Visit (INDEPENDENT_AMBULATORY_CARE_PROVIDER_SITE_OTHER): Payer: BLUE CROSS/BLUE SHIELD

## 2015-09-01 ENCOUNTER — Ambulatory Visit (INDEPENDENT_AMBULATORY_CARE_PROVIDER_SITE_OTHER): Payer: BLUE CROSS/BLUE SHIELD | Admitting: Advanced Practice Midwife

## 2015-09-01 ENCOUNTER — Encounter: Payer: Self-pay | Admitting: Advanced Practice Midwife

## 2015-09-01 VITALS — BP 146/88 | HR 94 | Wt 331.0 lb

## 2015-09-01 DIAGNOSIS — O10912 Unspecified pre-existing hypertension complicating pregnancy, second trimester: Secondary | ICD-10-CM

## 2015-09-01 DIAGNOSIS — IMO0002 Reserved for concepts with insufficient information to code with codable children: Secondary | ICD-10-CM

## 2015-09-01 DIAGNOSIS — Z8632 Personal history of gestational diabetes: Secondary | ICD-10-CM

## 2015-09-01 DIAGNOSIS — Z1389 Encounter for screening for other disorder: Secondary | ICD-10-CM

## 2015-09-01 DIAGNOSIS — O09892 Supervision of other high risk pregnancies, second trimester: Secondary | ICD-10-CM

## 2015-09-01 DIAGNOSIS — O09291 Supervision of pregnancy with other poor reproductive or obstetric history, first trimester: Secondary | ICD-10-CM

## 2015-09-01 DIAGNOSIS — O24410 Gestational diabetes mellitus in pregnancy, diet controlled: Secondary | ICD-10-CM

## 2015-09-01 DIAGNOSIS — Z0489 Encounter for examination and observation for other specified reasons: Secondary | ICD-10-CM

## 2015-09-01 DIAGNOSIS — Z98891 History of uterine scar from previous surgery: Secondary | ICD-10-CM

## 2015-09-01 DIAGNOSIS — Z331 Pregnant state, incidental: Secondary | ICD-10-CM

## 2015-09-01 DIAGNOSIS — Z141 Cystic fibrosis carrier: Secondary | ICD-10-CM

## 2015-09-01 LAB — POCT URINALYSIS DIPSTICK
Glucose, UA: NEGATIVE
Ketones, UA: NEGATIVE
Leukocytes, UA: NEGATIVE
NITRITE UA: NEGATIVE
Protein, UA: NEGATIVE
RBC UA: NEGATIVE

## 2015-09-01 NOTE — Progress Notes (Signed)
Fetal Surveillance Testing today:  [place]   High Risk Pregnancy Diagnosis(es):   CHTN, A1/BDM  [id number] [redacted]w[redacted]d Estimated Date of Delivery: 12/20/15  Last menstrual period 03/15/2015, unknown if currently breastfeeding.  Urinalysis: Negative    HPI: The patient is being seen today for ongoing management of CHTN, A1/BDM. Today she reports FBS all <90  2hr pp all <120.  :)  Has done very well with diet.    BP weight and urine results all reviewed and noted. Patient reports good fetal movement, denies any bleeding and no rupture of membranes symptoms or regular contractions.    Patient is without complaints other than noted in her HPI. All questions were answered.  All lab and sonogram results have been reviewed. Comments:  [place] 24+2wks measurements c/w dates,fhr 153 bpm,normal ov's bilat,svp of fluid 4.8cm,efw 893g 77%,post pl gr 0,cx 3.8cm,all anatomy complete,no obvious abnormalities seen  Assessment:  1.  Pregnancy at [redacted]w[redacted]d,  Estimated Date of Delivery: 12/20/15 :                          2.  CHTN:  better BP                        3.  A1/BDM:  Good control  Medication(s) Plans:  Continue Labetalol 200mg  BID  Treatment Plan:  Growth US/BPP 28 weeks; twice weekly testing (US/NST) >32 weeks; IOL 39 weeks  Return in about 4 weeks (around 09/29/2015) for HROB, US:BPP, US:OB F/U:. for appointment for high risk OB care  No orders of the defined types were placed in this encounter.   Orders Placed This Encounter  Procedures  . US FETAL BPP WO NON STRESS  . US OB Follow Up

## 2015-09-01 NOTE — Progress Notes (Signed)
[place] 24+2wks measurements c/w dates,fhr 153 bpm,normal ov's bilat,svp of fluid 4.8cm,efw 893g 77%,post pl gr 0,cx 3.8cm,all anatomy complete,no obvious abnormalities seen

## 2015-09-01 NOTE — Progress Notes (Signed)
Pt denies any problems or concerns at this time.  

## 2015-09-29 ENCOUNTER — Ambulatory Visit (INDEPENDENT_AMBULATORY_CARE_PROVIDER_SITE_OTHER): Payer: BLUE CROSS/BLUE SHIELD | Admitting: Advanced Practice Midwife

## 2015-09-29 ENCOUNTER — Ambulatory Visit (INDEPENDENT_AMBULATORY_CARE_PROVIDER_SITE_OTHER): Payer: BLUE CROSS/BLUE SHIELD

## 2015-09-29 ENCOUNTER — Encounter: Payer: Self-pay | Admitting: Advanced Practice Midwife

## 2015-09-29 VITALS — BP 144/78 | HR 72 | Wt 335.0 lb

## 2015-09-29 DIAGNOSIS — Z331 Pregnant state, incidental: Secondary | ICD-10-CM

## 2015-09-29 DIAGNOSIS — O10913 Unspecified pre-existing hypertension complicating pregnancy, third trimester: Secondary | ICD-10-CM

## 2015-09-29 DIAGNOSIS — O24410 Gestational diabetes mellitus in pregnancy, diet controlled: Secondary | ICD-10-CM

## 2015-09-29 DIAGNOSIS — Z141 Cystic fibrosis carrier: Secondary | ICD-10-CM

## 2015-09-29 DIAGNOSIS — O10912 Unspecified pre-existing hypertension complicating pregnancy, second trimester: Secondary | ICD-10-CM

## 2015-09-29 DIAGNOSIS — O09893 Supervision of other high risk pregnancies, third trimester: Secondary | ICD-10-CM

## 2015-09-29 DIAGNOSIS — O09892 Supervision of other high risk pregnancies, second trimester: Secondary | ICD-10-CM

## 2015-09-29 DIAGNOSIS — Z1389 Encounter for screening for other disorder: Secondary | ICD-10-CM

## 2015-09-29 DIAGNOSIS — Z98891 History of uterine scar from previous surgery: Secondary | ICD-10-CM

## 2015-09-29 DIAGNOSIS — Z3A29 29 weeks gestation of pregnancy: Secondary | ICD-10-CM

## 2015-09-29 DIAGNOSIS — O09291 Supervision of pregnancy with other poor reproductive or obstetric history, first trimester: Secondary | ICD-10-CM

## 2015-09-29 DIAGNOSIS — Z8632 Personal history of gestational diabetes: Secondary | ICD-10-CM

## 2015-09-29 LAB — POCT URINALYSIS DIPSTICK
Blood, UA: NEGATIVE
GLUCOSE UA: NEGATIVE
Ketones, UA: NEGATIVE
Leukocytes, UA: NEGATIVE
NITRITE UA: NEGATIVE

## 2015-09-29 NOTE — Progress Notes (Signed)
[place] 28+2 wks,cx 4.4cm,cephalic,BPP [id number] ov's bilat,post pl gr 0,afi 14.6 cm,fhr 134 bpm,EFW 1473 g 76%

## 2015-09-29 NOTE — Progress Notes (Signed)
Fetal Surveillance Testing today:  [place]   High Risk Pregnancy Diagnosis(es):   CHTN, A1/B DM  [id number] [redacted]w[redacted]d Estimated Date of Delivery: 12/20/15  Blood pressure 144/78, pulse 72, weight 335 lb (151.955 kg), last menstrual period 03/15/2015, unknown if currently breastfeeding.  Urinalysis: Positive for trace protein   HPI: The patient is being seen today for ongoing management of pregnancy, CHTN, A1/B DM Today she reports FBS all <90 and 2 hour pp all <120! :)     BP weight and urine results all reviewed and noted. Patient reports good fetal movement, denies any bleeding and no rupture of membranes symptoms or regular contractions.   Edema:  no  Patient is without complaints other than noted in her HPI. All questions were answered.  All lab and sonogram results have been reviewed. Comments:   [place] 28+2 wks,cx 4.4cm,cephalic,BPP [id number] ov's bilat,post pl gr 0,afi 14.6 cm,fhr 134 bpm,EFW 1473 g 76%         Assessment:  1.  Pregnancy at [redacted]w[redacted]d,  Estimated Date of Delivery: 12/20/15 :                          2.  CHTN, stable                        3.  A1/B DM, excellent control  Medication(s) Plans:  Continue ASA 81mg , Labetalol 200 mg BID  Treatment Plan:  Continue QID blood sugar testing, EFW q 4 weeks, twice weekly testing (US/NST) at 32 weeks;  IOL 39 weeks   Return in about 2 weeks (around 10/13/2015) for HROB. for appointment for high risk OB care  No orders of the defined types were placed in this encounter.   Orders Placed This Encounter  Procedures  . POCT urinalysis dipstick

## 2015-10-13 ENCOUNTER — Ambulatory Visit (INDEPENDENT_AMBULATORY_CARE_PROVIDER_SITE_OTHER): Payer: BLUE CROSS/BLUE SHIELD | Admitting: Advanced Practice Midwife

## 2015-10-13 ENCOUNTER — Encounter: Payer: Self-pay | Admitting: Advanced Practice Midwife

## 2015-10-13 VITALS — BP 140/88 | HR 88 | Wt 336.0 lb

## 2015-10-13 DIAGNOSIS — O24410 Gestational diabetes mellitus in pregnancy, diet controlled: Secondary | ICD-10-CM

## 2015-10-13 DIAGNOSIS — O10913 Unspecified pre-existing hypertension complicating pregnancy, third trimester: Secondary | ICD-10-CM

## 2015-10-13 DIAGNOSIS — Z1389 Encounter for screening for other disorder: Secondary | ICD-10-CM

## 2015-10-13 DIAGNOSIS — Z331 Pregnant state, incidental: Secondary | ICD-10-CM

## 2015-10-13 DIAGNOSIS — O09893 Supervision of other high risk pregnancies, third trimester: Secondary | ICD-10-CM

## 2015-10-13 LAB — POCT URINALYSIS DIPSTICK
GLUCOSE UA: NEGATIVE
Ketones, UA: NEGATIVE
Leukocytes, UA: NEGATIVE
NITRITE UA: NEGATIVE

## 2015-10-13 NOTE — Progress Notes (Signed)
Fetal Surveillance Testing today:  doppler   High Risk Pregnancy Diagnosis(es):   Lauretta Grill  [id number] [redacted]w[redacted]d Estimated Date of Delivery: 12/20/15  Blood pressure 110/88, pulse 88, weight 336 lb (152.409 kg), last menstrual period 03/15/2015, unknown if currently breastfeeding.  Urinalysis: Positive for trace protein   HPI: The patient is being seen today for ongoing management of CHTN, A1DM. Today she reports excellent BS, not one value out of range   BP weight and urine results all reviewed and noted. Patient reports good fetal movement, denies any bleeding and no rupture of membranes symptoms or regular contractions.  Fundal Height:  35 Fetal Heart rate:  140 Edema:  no  Patient is without complaints other than noted in her HPI. All questions were answered.  All lab and sonogram results have been reviewed. Comments:    Assessment:  1.  Pregnancy at [redacted]w[redacted]d,  Estimated Date of Delivery: 12/20/15 :                          2.  CHTN, stable                        3.  A1DM, excellent control  Medication(s) Plans:  Continue Labetalol 200mg  BID  Treatment Plan:  Start twice weekly testing at 32 weeks; growth [place] 32/36; IOL 39 weeks  Return in about 12 days (around 10/25/2015), or for NST/HROB and 10/27/15 for US/HROB. for appointment for high risk OB care  No orders of the defined types were placed in this encounter.   Orders Placed This Encounter  Procedures  . US FETAL BPP WO NON STRESS  . US OB Follow Up  . POCT urinalysis dipstick

## 2015-10-13 NOTE — Patient Instructions (Signed)
Sciatica With Rehab The sciatic nerve runs from the back down the leg and is responsible for sensation and control of the muscles in the back (posterior) side of the thigh, lower leg, and foot. Sciatica is a condition that is characterized by inflammation of this nerve.  SYMPTOMS   Signs of nerve damage, including numbness and/or weakness along the posterior side of the lower extremity.  Pain in the back of the thigh that may also travel down the leg.  Pain that worsens when sitting for long periods of time.  Occasionally, pain in the back or buttock. CAUSES  Inflammation of the sciatic nerve is the cause of sciatica. The inflammation is due to something irritating the nerve. Common sources of irritation include:  Sitting for long periods of time.  Direct trauma to the nerve.  Arthritis of the spine.  Herniated or ruptured disk.  Slipping of the vertebrae (spondylolisthesis).  Pressure from soft tissues, such as muscles or ligament-like tissue (fascia). RISK INCREASES WITH:  Sports that place pressure or stress on the spine (football or weightlifting).  Poor strength and flexibility.  Failure to warm up properly before activity.  Family history of low back pain or disk disorders.  Previous back injury or surgery.  Poor body mechanics, especially when lifting, or poor posture.  Pregnancy PREVENTION   Warm up and stretch properly before activity.  Maintain physical fitness:  Strength, flexibility, and endurance.  Cardiovascular fitness.  Learn and use proper technique, especially with posture and lifting. When possible, have coach correct improper technique.  Avoid activities that place stress on the spine. PROGNOSIS If treated properly, then sciatica usually resolves within 6 weeks. However, occasionally surgery is necessary.  RELATED COMPLICATIONS   Permanent nerve damage, including pain, numbness, tingle, or weakness.  Chronic back pain.  Risks of  surgery: infection, bleeding, nerve damage, or damage to surrounding tissues. TREATMENT Treatment initially involves resting from any activities that aggravate your symptoms. The use of ice and medication may help reduce pain and inflammation. The use of strengthening and stretching exercises may help reduce pain with activity. These exercises may be performed at home or with referral to a therapist. A therapist may recommend further treatments, such as transcutaneous electronic nerve stimulation (TENS) or ultrasound. Your caregiver may recommend corticosteroid injections to help reduce inflammation of the sciatic nerve. If symptoms persist despite non-surgical (conservative) treatment, then surgery may be recommended. MEDICATION  If pain medication is necessary, then nonsteroidal anti-inflammatory medications, such as aspirin and ibuprofen, or other minor pain relievers, such as acetaminophen, are often recommended.  Do not take pain medication for 7 days before surgery.  Ointments applied to the skin may be helpful.  Corticosteroid injections may be given by your caregiver. These injections should be reserved for the most serious cases, because they may only be given a certain number of times. HEAT AND COLD  Cold treatment (icing) relieves pain and reduces inflammation. Cold treatment should be applied for 10 to 15 minutes every 2 to 3 hours for inflammation and pain and immediately after any activity that aggravates your symptoms. Use ice packs or massage the area with a piece of ice (ice massage).  Heat treatment may be used prior to performing the stretching and strengthening activities prescribed by your caregiver, physical therapist, or athletic trainer. Use a heat pack or soak the injury in warm water. SEEK MEDICAL CARE IF:  Treatment seems to offer no benefit, or the condition worsens.  Any medications produce adverse side effects.  EXERCISES  RANGE OF MOTION (ROM) AND STRETCHING  EXERCISES - Sciatica Most people with sciatic will find that their symptoms worsen with either excessive bending forward (flexion) or arching at the low back (extension). The exercises which will help resolve your symptoms will focus on the opposite motion. Your physician, physical therapist or athletic trainer will help you determine which exercises will be most helpful to resolve your low back pain. Do not complete any exercises without first consulting with your clinician. Discontinue any exercises which worsen your symptoms until you speak to your clinician. If you have pain, numbness or tingling which travels down into your buttocks, leg or foot, the goal of the therapy is for these symptoms to move closer to your back and eventually resolve. Occasionally, these leg symptoms will get better, but your low back pain may worsen; this is typically an indication of progress in your rehabilitation. Be certain to be very alert to any changes in your symptoms and the activities in which you participated in the 24 hours prior to the change. Sharing this information with your clinician will allow him/her to most efficiently treat your condition. These exercises may help you when beginning to rehabilitate your injury. Your symptoms may resolve with or without further involvement from your physician, physical therapist or athletic trainer. While completing these exercises, remember:   Restoring tissue flexibility helps normal motion to return to the joints. This allows healthier, less painful movement and activity.  An effective stretch should be held for at least 30 seconds.  A stretch should never be painful. You should only feel a gentle lengthening or release in the stretched tissue. FLEXION RANGE OF MOTION AND STRETCHING EXERCISES: STRETCH - Flexion, Single Knee to Chest   Lie on a firm bed or floor with both legs extended in front of you.  Keeping one leg in contact with the floor, bring your opposite  knee to your chest. Hold your leg in place by either grabbing behind your thigh or at your knee.  Pull until you feel a gentle stretch in your low back. Hold __________ seconds.  Slowly release your grasp and repeat the exercise with the opposite side. Repeat __________ times. Complete this exercise __________ times per day.  STRETCH - Flexion, Double Knee to Chest  Lie on a firm bed or floor with both legs extended in front of you.  Keeping one leg in contact with the floor, bring your opposite knee to your chest.  Tense your stomach muscles to support your back and then lift your other knee to your chest. Hold your legs in place by either grabbing behind your thighs or at your knees.  Pull both knees toward your chest until you feel a gentle stretch in your low back. Hold __________ seconds.  Tense your stomach muscles and slowly return one leg at a time to the floor. Repeat __________ times. Complete this exercise __________ times per day.  STRETCH - Low Trunk Rotation   Lie on a firm bed or floor. Keeping your legs in front of you, bend your knees so they are both pointed toward the ceiling and your feet are flat on the floor.  Extend your arms out to the side. This will stabilize your upper body by keeping your shoulders in contact with the floor.  Gently and slowly drop both knees together to one side until you feel a gentle stretch in your low back. Hold for __________ seconds.  Tense your stomach muscles to support your low  back as you bring your knees back to the starting position. Repeat the exercise to the other side. Repeat __________ times. Complete this exercise __________ times per day  EXTENSION RANGE OF MOTION AND FLEXIBILITY EXERCISES: STRETCH - Extension, Prone on Elbows  Lie on your stomach on the floor, a bed will be too soft. Place your palms about shoulder width apart and at the height of your head.  Place your elbows under your shoulders. If this is too  painful, stack pillows under your chest.  Allow your body to relax so that your hips drop lower and make contact more completely with the floor.  Hold this position for __________ seconds.  Slowly return to lying flat on the floor. Repeat __________ times. Complete this exercise __________ times per day.  RANGE OF MOTION - Extension, Prone Press Ups  Lie on your stomach on the floor, a bed will be too soft. Place your palms about shoulder width apart and at the height of your head.  Keeping your back as relaxed as possible, slowly straighten your elbows while keeping your hips on the floor. You may adjust the placement of your hands to maximize your comfort. As you gain motion, your hands will come more underneath your shoulders.  Hold this position __________ seconds.  Slowly return to lying flat on the floor. Repeat __________ times. Complete this exercise __________ times per day.  STRENGTHENING EXERCISES - Sciatica  These exercises may help you when beginning to rehabilitate your injury. These exercises should be done near your "sweet spot." This is the neutral, low-back arch, somewhere between fully rounded and fully arched, that is your least painful position. When performed in this safe range of motion, these exercises can be used for people who have either a flexion or extension based injury. These exercises may resolve your symptoms with or without further involvement from your physician, physical therapist or athletic trainer. While completing these exercises, remember:   Muscles can gain both the endurance and the strength needed for everyday activities through controlled exercises.  Complete these exercises as instructed by your physician, physical therapist or athletic trainer. Progress with the resistance and repetition exercises only as your caregiver advises.  You may experience muscle soreness or fatigue, but the pain or discomfort you are trying to eliminate should never  worsen during these exercises. If this pain does worsen, stop and make certain you are following the directions exactly. If the pain is still present after adjustments, discontinue the exercise until you can discuss the trouble with your clinician. STRENGTHENING - Deep Abdominals, Pelvic Tilt   Lie on a firm bed or floor. Keeping your legs in front of you, bend your knees so they are both pointed toward the ceiling and your feet are flat on the floor.  Tense your lower abdominal muscles to press your low back into the floor. This motion will rotate your pelvis so that your tail bone is scooping upwards rather than pointing at your feet or into the floor.  With a gentle tension and even breathing, hold this position for __________ seconds. Repeat __________ times. Complete this exercise __________ times per day.  STRENGTHENING - Abdominals, Crunches   Lie on a firm bed or floor. Keeping your legs in front of you, bend your knees so they are both pointed toward the ceiling and your feet are flat on the floor. Cross your arms over your chest.  Slightly tip your chin down without bending your neck.  Tense your abdominals and  slowly lift your trunk high enough to just clear your shoulder blades. Lifting higher can put excessive stress on the low back and does not further strengthen your abdominal muscles.  Control your return to the starting position. Repeat __________ times. Complete this exercise __________ times per day.  STRENGTHENING - Quadruped, Opposite UE/LE Lift  Assume a hands and knees position on a firm surface. Keep your hands under your shoulders and your knees under your hips. You may place padding under your knees for comfort.  Find your neutral spine and gently tense your abdominal muscles so that you can maintain this position. Your shoulders and hips should form a rectangle that is parallel with the floor and is not twisted.  Keeping your trunk steady, lift your right hand no  higher than your shoulder and then your left leg no higher than your hip. Make sure you are not holding your breath. Hold this position __________ seconds.  Continuing to keep your abdominal muscles tense and your back steady, slowly return to your starting position. Repeat with the opposite arm and leg. Repeat __________ times. Complete this exercise __________ times per day.  STRENGTHENING - Abdominals and Quadriceps, Straight Leg Raise   Lie on a firm bed or floor with both legs extended in front of you.  Keeping one leg in contact with the floor, bend the other knee so that your foot can rest flat on the floor.  Find your neutral spine, and tense your abdominal muscles to maintain your spinal position throughout the exercise.  Slowly lift your straight leg off the floor about 6 inches for a count of 15, making sure to not hold your breath.  Still keeping your neutral spine, slowly lower your leg all the way to the floor. Repeat this exercise with each leg __________ times. Complete this exercise __________ times per day. POSTURE AND BODY MECHANICS CONSIDERATIONS - Sciatica Keeping correct posture when sitting, standing or completing your activities will reduce the stress put on different body tissues, allowing injured tissues a chance to heal and limiting painful experiences. The following are general guidelines for improved posture. Your physician or physical therapist will provide you with any instructions specific to your needs. While reading these guidelines, remember:  The exercises prescribed by your provider will help you have the flexibility and strength to maintain correct postures.  The correct posture provides the optimal environment for your joints to work. All of your joints have less wear and tear when properly supported by a spine with good posture. This means you will experience a healthier, less painful body.  Correct posture must be practiced with all of your activities,  especially prolonged sitting and standing. Correct posture is as important when doing repetitive low-stress activities (typing) as it is when doing a single heavy-load activity (lifting). RESTING POSITIONS Consider which positions are most painful for you when choosing a resting position. If you have pain with flexion-based activities (sitting, bending, stooping, squatting), choose a position that allows you to rest in a less flexed posture. You would want to avoid curling into a fetal position on your side. If your pain worsens with extension-based activities (prolonged standing, working overhead), avoid resting in an extended position such as sleeping on your stomach. Most people will find more comfort when they rest with their spine in a more neutral position, neither too rounded nor too arched. Lying on a non-sagging bed on your side with a pillow between your knees, or on your back with a pillow  under your knees will often provide some relief. Keep in mind, being in any one position for a prolonged period of time, no matter how correct your posture, can still lead to stiffness. PROPER SITTING POSTURE In order to minimize stress and discomfort on your spine, you must sit with correct posture Sitting with good posture should be effortless for a healthy body. Returning to good posture is a gradual process. Many people can work toward this most comfortably by using various supports until they have the flexibility and strength to maintain this posture on their own. When sitting with proper posture, your ears will fall over your shoulders and your shoulders will fall over your hips. You should use the back of the chair to support your upper back. Your low back will be in a neutral position, just slightly arched. You may place a small pillow or folded towel at the base of your low back for support.  When working at a desk, create an environment that supports good, upright posture. Without extra support, muscles  fatigue and lead to excessive strain on joints and other tissues. Keep these recommendations in mind: CHAIR:   A chair should be able to slide under your desk when your back makes contact with the back of the chair. This allows you to work closely.  The chair's height should allow your eyes to be level with the upper part of your monitor and your hands to be slightly lower than your elbows. BODY POSITION  Your feet should make contact with the floor. If this is not possible, use a foot rest.  Keep your ears over your shoulders. This will reduce stress on your neck and low back. INCORRECT SITTING POSTURES   If you are feeling tired and unable to assume a healthy sitting posture, do not slouch or slump. This puts excessive strain on your back tissues, causing more damage and pain. Healthier options include:  Using more support, like a lumbar pillow.  Switching tasks to something that requires you to be upright or walking.  Talking a brief walk.  Lying down to rest in a neutral-spine position. PROLONGED STANDING WHILE SLIGHTLY LEANING FORWARD  When completing a task that requires you to lean forward while standing in one place for a long time, place either foot up on a stationary 2-4 inch high object to help maintain the best posture. When both feet are on the ground, the low back tends to lose its slight inward curve. If this curve flattens (or becomes too large), then the back and your other joints will experience too much stress, fatigue more quickly and can cause pain.  CORRECT STANDING POSTURES Proper standing posture should be assumed with all daily activities, even if they only take a few moments, like when brushing your teeth. As in sitting, your ears should fall over your shoulders and your shoulders should fall over your hips. You should keep a slight tension in your abdominal muscles to brace your spine. Your tailbone should point down to the ground, not behind your body, resulting  in an over-extended swayback posture.  INCORRECT STANDING POSTURES  Common incorrect standing postures include a forward head, locked knees and/or an excessive swayback. WALKING Walk with an upright posture. Your ears, shoulders and hips should all line-up. PROLONGED ACTIVITY IN A FLEXED POSITION When completing a task that requires you to bend forward at your waist or lean over a low surface, try to find a way to stabilize 3 of 4 of your limbs.  You can place a hand or elbow on your thigh or rest a knee on the surface you are reaching across. This will provide you more stability so that your muscles do not fatigue as quickly. By keeping your knees relaxed, or slightly bent, you will also reduce stress across your low back. CORRECT LIFTING TECHNIQUES DO :   Assume a wide stance. This will provide you more stability and the opportunity to get as close as possible to the object which you are lifting.  Tense your abdominals to brace your spine; then bend at the knees and hips. Keeping your back locked in a neutral-spine position, lift using your leg muscles. Lift with your legs, keeping your back straight.  Test the weight of unknown objects before attempting to lift them.  Try to keep your elbows locked down at your sides in order get the best strength from your shoulders when carrying an object.  Always ask for help when lifting heavy or awkward objects. INCORRECT LIFTING TECHNIQUES DO NOT:   Lock your knees when lifting, even if it is a small object.  Bend and twist. Pivot at your feet or move your feet when needing to change directions.  Assume that you cannot safely pick up a paperclip without proper posture.   This information is not intended to replace advice given to you by your health care provider. Make sure you discuss any questions you have with your health care provider.   Document Released: 07/24/2005 Document Revised: 12/08/2014 Document Reviewed: 11/05/2008 [employer]vier  Interactive Patient Education [employer].

## 2015-10-25 ENCOUNTER — Ambulatory Visit (INDEPENDENT_AMBULATORY_CARE_PROVIDER_SITE_OTHER): Payer: BLUE CROSS/BLUE SHIELD | Admitting: Obstetrics & Gynecology

## 2015-10-25 ENCOUNTER — Encounter: Payer: Self-pay | Admitting: Obstetrics & Gynecology

## 2015-10-25 VITALS — BP 130/70 | HR 100 | Wt 335.0 lb

## 2015-10-25 DIAGNOSIS — O24410 Gestational diabetes mellitus in pregnancy, diet controlled: Secondary | ICD-10-CM

## 2015-10-25 DIAGNOSIS — O10913 Unspecified pre-existing hypertension complicating pregnancy, third trimester: Secondary | ICD-10-CM

## 2015-10-25 DIAGNOSIS — O09893 Supervision of other high risk pregnancies, third trimester: Secondary | ICD-10-CM

## 2015-10-25 DIAGNOSIS — Z1389 Encounter for screening for other disorder: Secondary | ICD-10-CM

## 2015-10-25 DIAGNOSIS — Z331 Pregnant state, incidental: Secondary | ICD-10-CM

## 2015-10-25 LAB — POCT URINALYSIS DIPSTICK
GLUCOSE UA: NEGATIVE
Ketones, UA: NEGATIVE
Leukocytes, UA: NEGATIVE
Nitrite, UA: NEGATIVE
Protein, UA: 1
RBC UA: NEGATIVE

## 2015-10-25 NOTE — Progress Notes (Signed)
  Fetal Surveillance Testing today:  Reactive NST   High Risk Pregnancy Diagnosis(es):   Chronic hyypertension, Class A1 DM  [id number] [redacted]w[redacted]d Estimated Date of Delivery: 12/20/15  Blood pressure 130/70, pulse 100, weight 335 lb (151.955 kg), last menstrual period 03/15/2015, unknown if currently breastfeeding.  Urinalysis: Positive for 1+ protein   HPI: The patient is being seen today for ongoing management of as above. Today she reports home CBG are good, BP also ok   BP weight and urine results all reviewed and noted. Patient reports good fetal movement, denies any bleeding and no rupture of membranes symptoms or regular contractions.  Fundal Height:  38 Fetal Heart rate:  145 Edema:  none  Patient is without complaints other than noted in her HPI. All questions were answered.  All lab and sonogram results have been reviewed. Comments: abnormal: 2 hour   Assessment:  1.  Pregnancy at [redacted]w[redacted]d,  Estimated Date of Delivery: 12/20/15 :                          2.  Chronic Hypertension                        3.  Class A1 DM  Medication(s) Plans:  Baby ASA + labetalol 200 BID  Treatment Plan:  Twice weekly surveillance, sono alt with NST  No Follow-up on file. for appointment for high risk OB care  No orders of the defined types were placed in this encounter.   Orders Placed This Encounter  Procedures  . POCT urinalysis dipstick

## 2015-10-27 ENCOUNTER — Ambulatory Visit (INDEPENDENT_AMBULATORY_CARE_PROVIDER_SITE_OTHER): Payer: BLUE CROSS/BLUE SHIELD

## 2015-10-27 ENCOUNTER — Encounter: Payer: Self-pay | Admitting: Obstetrics and Gynecology

## 2015-10-27 ENCOUNTER — Ambulatory Visit (INDEPENDENT_AMBULATORY_CARE_PROVIDER_SITE_OTHER): Payer: BLUE CROSS/BLUE SHIELD | Admitting: Obstetrics and Gynecology

## 2015-10-27 VITALS — BP 130/92 | HR 100 | Wt 335.5 lb

## 2015-10-27 DIAGNOSIS — O10913 Unspecified pre-existing hypertension complicating pregnancy, third trimester: Secondary | ICD-10-CM | POA: Diagnosis not present

## 2015-10-27 DIAGNOSIS — Z1389 Encounter for screening for other disorder: Secondary | ICD-10-CM

## 2015-10-27 DIAGNOSIS — O09893 Supervision of other high risk pregnancies, third trimester: Secondary | ICD-10-CM

## 2015-10-27 DIAGNOSIS — O24410 Gestational diabetes mellitus in pregnancy, diet controlled: Secondary | ICD-10-CM

## 2015-10-27 DIAGNOSIS — Z8632 Personal history of gestational diabetes: Secondary | ICD-10-CM

## 2015-10-27 DIAGNOSIS — O34219 Maternal care for unspecified type scar from previous cesarean delivery: Secondary | ICD-10-CM

## 2015-10-27 DIAGNOSIS — O09291 Supervision of pregnancy with other poor reproductive or obstetric history, first trimester: Secondary | ICD-10-CM

## 2015-10-27 DIAGNOSIS — Z141 Cystic fibrosis carrier: Secondary | ICD-10-CM

## 2015-10-27 DIAGNOSIS — Z331 Pregnant state, incidental: Secondary | ICD-10-CM

## 2015-10-27 DIAGNOSIS — Z98891 History of uterine scar from previous surgery: Secondary | ICD-10-CM

## 2015-10-27 LAB — POCT URINALYSIS DIPSTICK
Glucose, UA: NEGATIVE
KETONES UA: NEGATIVE
Leukocytes, UA: NEGATIVE
Nitrite, UA: NEGATIVE
RBC UA: NEGATIVE

## 2015-10-27 NOTE — Progress Notes (Signed)
Patient ID: [name], female   DOB: 16-Oct-1985, 30 y.o.   MRN: [id number]   High Risk Pregnancy Diagnosis(es):   Lauretta Grill  [id number] [redacted]w[redacted]d Estimated Date of Delivery: 12/20/15    HPI: The patient is being seen today for ongoing management of Chtn., A1dm. Today she reports no complaints.  Patient reports good fetal movement, denies any bleeding and no rupture of membranes symptoms or regular contractions.   BP weight and urine results reviewed and noted. Blood pressure 130/92, pulse 100, weight 335 lb 8 oz (152.182 kg), last menstrual period 03/15/2015, unknown if currently breastfeeding.  Fetal Surveillance Testing today: US FETAL BPP WO NON STRESS completed today.  Fundal Height:  36 Fetal Heart rate:  144 Edema:  none Urinalysis: Negative  Questions were answered.  Lab and sonogram results have been reviewed. BPP today 8/8 Comments: normal bpp 8/8  Assessment:  1.  Pregnancy at [redacted]w[redacted]d,  Estimated Date of Delivery: 12/20/15 :                          2.  High Risk OB: Chtn., A1dm                         Medication(s) Plans: Increase Labetalol to 400 bid  Treatment Plan:   1. Follow up next Monday for appointment for high risk OB care, NST 2 CBG's not reviewed this appt.   By signing my name below, I, Terressa Koyanagi, attest that this documentation has been prepared under the direction and in the presence of Mallory Shirk, MD. Electronically Signed: Terressa Koyanagi, ED Scribe. 10/27/2015. 12:05 PM.   I personally performed the services described in this documentation, which was SCRIBED in my presence. The recorded information has been reviewed and considered accurate. It has been edited as necessary during review. Jonnie Kind, MD

## 2015-10-27 NOTE — Progress Notes (Signed)
Pt denies any problems or concerns at this time.  

## 2015-10-27 NOTE — Progress Notes (Signed)
[place] 32+2wks,cephalic,post pl gr 2,normal ov's bilat,afi 17cm,BPP 8/8,RI .50,.55,EFW 2451 g 79%

## 2015-11-01 ENCOUNTER — Encounter: Payer: Self-pay | Admitting: Obstetrics & Gynecology

## 2015-11-01 ENCOUNTER — Ambulatory Visit (INDEPENDENT_AMBULATORY_CARE_PROVIDER_SITE_OTHER): Payer: BLUE CROSS/BLUE SHIELD | Admitting: Obstetrics & Gynecology

## 2015-11-01 VITALS — BP 120/80 | HR 100 | Wt 336.0 lb

## 2015-11-01 DIAGNOSIS — O09893 Supervision of other high risk pregnancies, third trimester: Secondary | ICD-10-CM

## 2015-11-01 DIAGNOSIS — O24410 Gestational diabetes mellitus in pregnancy, diet controlled: Secondary | ICD-10-CM | POA: Diagnosis not present

## 2015-11-01 DIAGNOSIS — O10913 Unspecified pre-existing hypertension complicating pregnancy, third trimester: Secondary | ICD-10-CM

## 2015-11-01 DIAGNOSIS — Z3A33 33 weeks gestation of pregnancy: Secondary | ICD-10-CM

## 2015-11-01 DIAGNOSIS — Z331 Pregnant state, incidental: Secondary | ICD-10-CM

## 2015-11-01 DIAGNOSIS — Z1389 Encounter for screening for other disorder: Secondary | ICD-10-CM

## 2015-11-01 DIAGNOSIS — O10013 Pre-existing essential hypertension complicating pregnancy, third trimester: Secondary | ICD-10-CM | POA: Diagnosis not present

## 2015-11-01 LAB — POCT URINALYSIS DIPSTICK
Glucose, UA: NEGATIVE
KETONES UA: NEGATIVE
NITRITE UA: NEGATIVE

## 2015-11-01 NOTE — Progress Notes (Signed)
Fetal Surveillance Testing today:  Reactive NST   High Risk Pregnancy Diagnosis(es):   Chronic hypertension A1 diabetes  [id number] [redacted]w[redacted]d Estimated Date of Delivery: 12/20/15  Blood pressure 120/80, pulse 100, weight 336 lb (152.409 kg), last menstrual period 03/15/2015, unknown if currently breastfeeding.  Urinalysis: Positive   HPI: The patient is being seen today for ongoing management of chronic hypertension and gestational diabetes. Today she reports CBGs and blood pressure good time   BP weight and urine results all reviewed and noted. Patient reports good fetal movement, denies any bleeding and no rupture of membranes symptoms or regular contractions.  Fundal Height:  40 cm Fetal Heart rate:  143 Edema:  Trace  Patient is without complaints other than noted in her HPI. All questions were answered.  All lab and sonogram results have been reviewed. Comments:    Assessment:  1.  Pregnancy at [redacted]w[redacted]d,  Estimated Date of Delivery: 12/20/15 :                          2.  Chronic hypertension                        3.  Gestational diabetes  Medication(s) Plans:  Labetalol 400 twice a day  Treatment Plan:  Twice weekly fetal surveillance  No Follow-up on file. for appointment for high risk OB care  No orders of the defined types were placed in this encounter.   Orders Placed This Encounter  Procedures  . POCT Urinalysis Dipstick

## 2015-11-04 ENCOUNTER — Encounter: Payer: Self-pay | Admitting: Obstetrics & Gynecology

## 2015-11-04 ENCOUNTER — Ambulatory Visit (INDEPENDENT_AMBULATORY_CARE_PROVIDER_SITE_OTHER): Payer: BLUE CROSS/BLUE SHIELD | Admitting: Obstetrics & Gynecology

## 2015-11-04 ENCOUNTER — Other Ambulatory Visit: Payer: Self-pay | Admitting: Advanced Practice Midwife

## 2015-11-04 ENCOUNTER — Ambulatory Visit (INDEPENDENT_AMBULATORY_CARE_PROVIDER_SITE_OTHER): Payer: BLUE CROSS/BLUE SHIELD

## 2015-11-04 VITALS — BP 100/80 | HR 112 | Wt 335.0 lb

## 2015-11-04 DIAGNOSIS — Z8632 Personal history of gestational diabetes: Secondary | ICD-10-CM

## 2015-11-04 DIAGNOSIS — Z3A34 34 weeks gestation of pregnancy: Secondary | ICD-10-CM

## 2015-11-04 DIAGNOSIS — O24410 Gestational diabetes mellitus in pregnancy, diet controlled: Secondary | ICD-10-CM | POA: Diagnosis not present

## 2015-11-04 DIAGNOSIS — Z1389 Encounter for screening for other disorder: Secondary | ICD-10-CM

## 2015-11-04 DIAGNOSIS — O10913 Unspecified pre-existing hypertension complicating pregnancy, third trimester: Secondary | ICD-10-CM | POA: Diagnosis not present

## 2015-11-04 DIAGNOSIS — Z331 Pregnant state, incidental: Secondary | ICD-10-CM

## 2015-11-04 DIAGNOSIS — O09893 Supervision of other high risk pregnancies, third trimester: Secondary | ICD-10-CM

## 2015-11-04 DIAGNOSIS — O09291 Supervision of pregnancy with other poor reproductive or obstetric history, first trimester: Secondary | ICD-10-CM

## 2015-11-04 DIAGNOSIS — Z98891 History of uterine scar from previous surgery: Secondary | ICD-10-CM

## 2015-11-04 DIAGNOSIS — Z141 Cystic fibrosis carrier: Secondary | ICD-10-CM

## 2015-11-04 LAB — POCT URINALYSIS DIPSTICK
Blood, UA: NEGATIVE
GLUCOSE UA: NEGATIVE
KETONES UA: NEGATIVE
LEUKOCYTES UA: NEGATIVE
Nitrite, UA: NEGATIVE

## 2015-11-04 NOTE — Progress Notes (Signed)
Fetal Surveillance Testing today:  BPP 8/8 with excellent Doppler flow   High Risk Pregnancy Diagnosis(es):   Chronic hypertension + Class A1 DM  [id number] [redacted]w[redacted]d Estimated Date of Delivery: 12/20/15  Weight 335 lb (151.955 kg), last menstrual period 03/15/2015, unknown if currently breastfeeding.  Urinalysis: Negative   HPI: The patient is being seen today for ongoing management of s above. Today she reports CBG are good stable   BP weight and urine results all reviewed and noted. Patient reports good fetal movement, denies any bleeding and no rupture of membranes symptoms or regular contractions.  Fundal Height:  40 Fetal Heart rate:  145 Edema:  1+  Patient is without complaints other than noted in her HPI. All questions were answered.  All lab and sonogram results have been reviewed. Comments:    Assessment:  1.  Pregnancy at [redacted]w[redacted]d,  Estimated Date of Delivery: 12/20/15 :                          2.  Chronic Hypertension                        3.  Class A1 DM  Medication(s) Plans:  Labetalol 400 BID, ASA  Treatment Plan:  Twice weekly surveillance, plan delivery at 34 weeks unless clinically indicated otherwise  No Follow-up on file. for appointment for high risk OB care  No orders of the defined types were placed in this encounter.   No orders of the defined types were placed in this encounter.

## 2015-11-04 NOTE — Progress Notes (Signed)
[place] 33+3 wks,afi 16cm,cephalic,BPP [id number] .[id number] pl,fhr 141 bpm,post pl gr 2

## 2015-11-04 NOTE — Addendum Note (Signed)
Addended by: Diona Fanti A on: 11/04/2015 12:37 PM   Modules accepted: Orders

## 2015-11-08 ENCOUNTER — Encounter: Payer: Self-pay | Admitting: Obstetrics & Gynecology

## 2015-11-08 ENCOUNTER — Ambulatory Visit (INDEPENDENT_AMBULATORY_CARE_PROVIDER_SITE_OTHER): Payer: BLUE CROSS/BLUE SHIELD | Admitting: Obstetrics & Gynecology

## 2015-11-08 VITALS — BP 100/60 | HR 116 | Wt 336.0 lb

## 2015-11-08 DIAGNOSIS — O10913 Unspecified pre-existing hypertension complicating pregnancy, third trimester: Secondary | ICD-10-CM

## 2015-11-08 DIAGNOSIS — Z1389 Encounter for screening for other disorder: Secondary | ICD-10-CM

## 2015-11-08 DIAGNOSIS — Z331 Pregnant state, incidental: Secondary | ICD-10-CM

## 2015-11-08 DIAGNOSIS — O24410 Gestational diabetes mellitus in pregnancy, diet controlled: Secondary | ICD-10-CM | POA: Diagnosis not present

## 2015-11-08 DIAGNOSIS — O09893 Supervision of other high risk pregnancies, third trimester: Secondary | ICD-10-CM

## 2015-11-08 LAB — POCT URINALYSIS DIPSTICK
GLUCOSE UA: NEGATIVE
KETONES UA: NEGATIVE
Nitrite, UA: NEGATIVE
RBC UA: NEGATIVE

## 2015-11-08 NOTE — Progress Notes (Signed)
Fetal Surveillance Testing today:  Reactive NST   High Risk Pregnancy Diagnosis(es):   Class A1 DM, Chronic hypertension  [id number] [redacted]w[redacted]d Estimated Date of Delivery: 12/20/15  Blood pressure 100/60, pulse 116, weight 336 lb (152.409 kg), last menstrual period 03/15/2015, unknown if currently breastfeeding.  Urinalysis: Negative   HPI: The patient is being seen today for ongoing management of as above. Today she reports CBG are excellent   BP weight and urine results all reviewed and noted. Patient reports good fetal movement, denies any bleeding and no rupture of membranes symptoms or regular contractions.  Fundal Height:  42 Fetal Heart rate:  135 Edema:  none  Patient is without complaints other than noted in her HPI. All questions were answered.  All lab and sonogram results have been reviewed. Comments: abnormal: 2 hour   Assessment:  1.  Pregnancy at [redacted]w[redacted]d,  Estimated Date of Delivery: 12/20/15 :                          2.  Chronic hYpertension                        3.  Class A1 DM  Medication(s) Plans:  Labetalol 400 BID  Treatment Plan:  Twice weekly surveillance, sono alt with NST, planned induction 39 weeks  No Follow-up on file. for appointment for high risk OB care  No orders of the defined types were placed in this encounter.   Orders Placed This Encounter  Procedures  . POCT urinalysis dipstick

## 2015-11-11 ENCOUNTER — Ambulatory Visit (INDEPENDENT_AMBULATORY_CARE_PROVIDER_SITE_OTHER): Payer: BLUE CROSS/BLUE SHIELD

## 2015-11-11 ENCOUNTER — Other Ambulatory Visit: Payer: Self-pay | Admitting: Obstetrics & Gynecology

## 2015-11-11 ENCOUNTER — Ambulatory Visit (INDEPENDENT_AMBULATORY_CARE_PROVIDER_SITE_OTHER): Payer: BLUE CROSS/BLUE SHIELD | Admitting: Obstetrics & Gynecology

## 2015-11-11 ENCOUNTER — Encounter: Payer: Self-pay | Admitting: Obstetrics & Gynecology

## 2015-11-11 VITALS — BP 130/100 | HR 124 | Wt 337.0 lb

## 2015-11-11 DIAGNOSIS — O24410 Gestational diabetes mellitus in pregnancy, diet controlled: Secondary | ICD-10-CM | POA: Diagnosis not present

## 2015-11-11 DIAGNOSIS — O10913 Unspecified pre-existing hypertension complicating pregnancy, third trimester: Secondary | ICD-10-CM | POA: Diagnosis not present

## 2015-11-11 DIAGNOSIS — O09291 Supervision of pregnancy with other poor reproductive or obstetric history, first trimester: Secondary | ICD-10-CM

## 2015-11-11 DIAGNOSIS — Z8632 Personal history of gestational diabetes: Secondary | ICD-10-CM

## 2015-11-11 DIAGNOSIS — Z3A35 35 weeks gestation of pregnancy: Secondary | ICD-10-CM

## 2015-11-11 DIAGNOSIS — Z1389 Encounter for screening for other disorder: Secondary | ICD-10-CM

## 2015-11-11 DIAGNOSIS — Z98891 History of uterine scar from previous surgery: Secondary | ICD-10-CM

## 2015-11-11 DIAGNOSIS — O09893 Supervision of other high risk pregnancies, third trimester: Secondary | ICD-10-CM

## 2015-11-11 DIAGNOSIS — Z331 Pregnant state, incidental: Secondary | ICD-10-CM

## 2015-11-11 DIAGNOSIS — Z141 Cystic fibrosis carrier: Secondary | ICD-10-CM

## 2015-11-11 LAB — POCT URINALYSIS DIPSTICK
Blood, UA: NEGATIVE
GLUCOSE UA: NEGATIVE
KETONES UA: NEGATIVE
LEUKOCYTES UA: NEGATIVE
Nitrite, UA: NEGATIVE

## 2015-11-11 NOTE — Progress Notes (Signed)
Fetal Surveillance Testing today:  BPP 8/8 with good Dopplers   High Risk Pregnancy Diagnosis(es):   Chronic hypertension, Class A1 DM  [id number] [redacted]w[redacted]d Estimated Date of Delivery: 12/20/15  Blood pressure 130/100, pulse 124, weight 337 lb (152.862 kg), last menstrual period 03/15/2015, unknown if currently breastfeeding.  Urinalysis: Negative   HPI: The patient is being seen today for ongoing management of as above. Today she reports cbg are excellent   BP weight and urine results all reviewed and noted. Patient reports good fetal movement, denies any bleeding and no rupture of membranes symptoms or regular contractions.  Fundal Height:  42 Fetal Heart rate:  149 Edema:  1+  Patient is without complaints other than noted in her HPI. All questions were answered.  All lab and sonogram results have been reviewed. Comments: abnormal:    Assessment:  1.  Pregnancy at [redacted]w[redacted]d,  Estimated Date of Delivery: 12/20/15 :                          2.  Chronic Hypertension                        3.  Class A1 DM  Medication(s) Plans:  Labetalol 400 mg BID, ASA, BP up a bit today, will monitor if continues will consider upping medication  Treatment Plan:  Twice weekly assessment with induction at 39 weeks  Return in about 4 days (around 11/15/2015) for NST, HROB. for appointment for high risk OB care  No orders of the defined types were placed in this encounter.   Orders Placed This Encounter  Procedures  . POCT urinalysis dipstick

## 2015-11-11 NOTE — Progress Notes (Signed)
[place] 34+3wks,cephalic,post pl gr 2, afi 16.5 cm,fhr 149 bpm,BPP 8/8,RI .58,.58,efw 3140 g 87%, AC >97%

## 2015-11-15 ENCOUNTER — Encounter: Payer: Self-pay | Admitting: Obstetrics and Gynecology

## 2015-11-15 ENCOUNTER — Ambulatory Visit (INDEPENDENT_AMBULATORY_CARE_PROVIDER_SITE_OTHER): Payer: BLUE CROSS/BLUE SHIELD | Admitting: Obstetrics and Gynecology

## 2015-11-15 VITALS — BP 176/104 | HR 99 | Wt 339.0 lb

## 2015-11-15 DIAGNOSIS — O10913 Unspecified pre-existing hypertension complicating pregnancy, third trimester: Secondary | ICD-10-CM | POA: Diagnosis not present

## 2015-11-15 DIAGNOSIS — Z331 Pregnant state, incidental: Secondary | ICD-10-CM

## 2015-11-15 DIAGNOSIS — O24410 Gestational diabetes mellitus in pregnancy, diet controlled: Secondary | ICD-10-CM

## 2015-11-15 DIAGNOSIS — Z1389 Encounter for screening for other disorder: Secondary | ICD-10-CM

## 2015-11-15 DIAGNOSIS — O09893 Supervision of other high risk pregnancies, third trimester: Secondary | ICD-10-CM | POA: Diagnosis not present

## 2015-11-15 LAB — POCT URINALYSIS DIPSTICK
Glucose, UA: NEGATIVE
KETONES UA: NEGATIVE
Leukocytes, UA: NEGATIVE
NITRITE UA: NEGATIVE
PROTEIN UA: NEGATIVE
RBC UA: NEGATIVE

## 2015-11-15 NOTE — Progress Notes (Signed)
Patient ID: Carolynne Edouard, female   DOB: 08/12/1985, 30 y.o.   MRN: [id number]  High Risk Pregnancy Diagnosis(es):   Chronic hypertension, Class A1 DM  [id number] [redacted]w[redacted]d Estimated Date of Delivery: 12/20/15    HPI: The patient is being seen today for ongoing management of high risk pregnancy, Chronic hypertension, Class A1 DM.  Pt presents today for for NST, HROB. Further today she reports no c/o.  Patient reports good fetal movement, denies any bleeding and no rupture of membranes symptoms or regular contractions.  BP weight and urine results reviewed and noted. Last menstrual period 03/15/2015, unknown if currently breastfeeding.  Fetal Surveillance Testing today:  NST Fundal Height:  38 Fetal Heart rate:  145 Edema:  minimal Urinalysis: Negative  Questions were answered.  Lab and sonogram results have been reviewed. Comments: normal   Assessment:  1.  Pregnancy at [redacted]w[redacted]d,  Estimated Date of Delivery: 12/20/15 :  4                        2.  CHTN                         3. A1 DM  Medication(s) Plans:  No change  Treatment Plan:  Biweekly testing IOL 39 wk  Follow up in 0.5 wks for appointment for high risk OB care, testing  By signing my name below, I, Terressa Koyanagi, attest that this documentation has been prepared under the direction and in the presence of Mallory Shirk, MD. Electronically Signed: Terressa Koyanagi, ED Scribe. 11/15/2015. 1:26 PM.   I personally performed the services described in this documentation, which was SCRIBED in my presence. The recorded information has been reviewed and considered accurate. It has been edited as necessary during review. Jonnie Kind, MD

## 2015-11-15 NOTE — Progress Notes (Signed)
Pt denies any problems or concerns at this time.  

## 2015-11-17 ENCOUNTER — Other Ambulatory Visit: Payer: Self-pay | Admitting: Obstetrics & Gynecology

## 2015-11-17 DIAGNOSIS — O24419 Gestational diabetes mellitus in pregnancy, unspecified control: Secondary | ICD-10-CM

## 2015-11-17 DIAGNOSIS — O10912 Unspecified pre-existing hypertension complicating pregnancy, second trimester: Secondary | ICD-10-CM

## 2015-11-18 ENCOUNTER — Ambulatory Visit (INDEPENDENT_AMBULATORY_CARE_PROVIDER_SITE_OTHER): Payer: BLUE CROSS/BLUE SHIELD | Admitting: Obstetrics & Gynecology

## 2015-11-18 ENCOUNTER — Encounter: Payer: Self-pay | Admitting: Obstetrics & Gynecology

## 2015-11-18 ENCOUNTER — Ambulatory Visit (INDEPENDENT_AMBULATORY_CARE_PROVIDER_SITE_OTHER): Payer: BLUE CROSS/BLUE SHIELD

## 2015-11-18 VITALS — BP 138/88 | HR 92 | Wt 339.0 lb

## 2015-11-18 DIAGNOSIS — O24419 Gestational diabetes mellitus in pregnancy, unspecified control: Secondary | ICD-10-CM

## 2015-11-18 DIAGNOSIS — O09893 Supervision of other high risk pregnancies, third trimester: Secondary | ICD-10-CM

## 2015-11-18 DIAGNOSIS — O10912 Unspecified pre-existing hypertension complicating pregnancy, second trimester: Secondary | ICD-10-CM

## 2015-11-18 DIAGNOSIS — Z331 Pregnant state, incidental: Secondary | ICD-10-CM

## 2015-11-18 DIAGNOSIS — O09291 Supervision of pregnancy with other poor reproductive or obstetric history, first trimester: Secondary | ICD-10-CM

## 2015-11-18 DIAGNOSIS — O24410 Gestational diabetes mellitus in pregnancy, diet controlled: Secondary | ICD-10-CM

## 2015-11-18 DIAGNOSIS — Z1389 Encounter for screening for other disorder: Secondary | ICD-10-CM

## 2015-11-18 DIAGNOSIS — Z141 Cystic fibrosis carrier: Secondary | ICD-10-CM

## 2015-11-18 DIAGNOSIS — Z8632 Personal history of gestational diabetes: Secondary | ICD-10-CM

## 2015-11-18 DIAGNOSIS — Z98891 History of uterine scar from previous surgery: Secondary | ICD-10-CM

## 2015-11-18 DIAGNOSIS — O10913 Unspecified pre-existing hypertension complicating pregnancy, third trimester: Secondary | ICD-10-CM

## 2015-11-18 LAB — POCT URINALYSIS DIPSTICK
Blood, UA: NEGATIVE
Glucose, UA: NEGATIVE
Ketones, UA: NEGATIVE
LEUKOCYTES UA: NEGATIVE
Nitrite, UA: NEGATIVE

## 2015-11-18 NOTE — Progress Notes (Signed)
Fetal Surveillance Testing today:  BPP 8/8, Dopplers are good   High Risk Pregnancy Diagnosis(es):   Chronic Hypertension(h/o pre eclampsia), Class A1 DM  [id number] [redacted]w[redacted]d Estimated Date of Delivery: 12/20/15  Blood pressure 138/88, pulse 92, weight 339 lb (153.769 kg), last menstrual period 03/15/2015, unknown if currently breastfeeding.  Urinalysis: Negative   HPI: The patient is being seen today for ongoing management of as above. Today she reports CBG and BP are both excellent, she forgot to take her BP med before her visit 3 days ago   BP weight and urine results all reviewed and noted. Patient reports good fetal movement, denies any bleeding and no rupture of membranes symptoms or regular contractions.  Fundal Height:  42 Fetal Heart rate:  155 Edema:  1+  Patient is without complaints other than noted in her HPI. All questions were answered.  All lab and sonogram results have been reviewed. Comments: abnormal: 2 hour   Assessment:  1.  Pregnancy at [redacted]w[redacted]d,  Estimated Date of Delivery: 12/20/15 :                          2.  Chronic Hypertension                        3.  Class A1 DM  Medication(s) Plans:  Labetalol 400 BID + baby ASA  Treatment Plan:  Twice weekly surveillance, sonogram alternating with NST, induction at 39 weeks or as clinically indicated   No Follow-up on file. for appointment for high risk OB care  No orders of the defined types were placed in this encounter.   Orders Placed This Encounter  Procedures  . POCT urinalysis dipstick

## 2015-11-18 NOTE — Progress Notes (Signed)
[place] [id number] wks,cephalic,post pl 2,normal ov's bilat,BPP 8/8,fhr 155 bpm,afi 17.6cm,RI .57,.53

## 2015-11-22 ENCOUNTER — Ambulatory Visit (INDEPENDENT_AMBULATORY_CARE_PROVIDER_SITE_OTHER): Payer: BLUE CROSS/BLUE SHIELD | Admitting: Obstetrics & Gynecology

## 2015-11-22 ENCOUNTER — Encounter: Payer: Self-pay | Admitting: Obstetrics & Gynecology

## 2015-11-22 VITALS — BP 140/80 | HR 76 | Wt 341.0 lb

## 2015-11-22 DIAGNOSIS — O10913 Unspecified pre-existing hypertension complicating pregnancy, third trimester: Secondary | ICD-10-CM | POA: Diagnosis not present

## 2015-11-22 DIAGNOSIS — O24410 Gestational diabetes mellitus in pregnancy, diet controlled: Secondary | ICD-10-CM

## 2015-11-22 DIAGNOSIS — O09893 Supervision of other high risk pregnancies, third trimester: Secondary | ICD-10-CM

## 2015-11-22 DIAGNOSIS — Z3A36 36 weeks gestation of pregnancy: Secondary | ICD-10-CM | POA: Diagnosis not present

## 2015-11-22 DIAGNOSIS — Z1389 Encounter for screening for other disorder: Secondary | ICD-10-CM

## 2015-11-22 DIAGNOSIS — Z331 Pregnant state, incidental: Secondary | ICD-10-CM

## 2015-11-22 LAB — POCT URINALYSIS DIPSTICK
GLUCOSE UA: 1
LEUKOCYTES UA: NEGATIVE
Nitrite, UA: NEGATIVE
RBC UA: NEGATIVE

## 2015-11-22 NOTE — Progress Notes (Signed)
Fetal Surveillance Testing today:  Reactive NST   High Risk Pregnancy Diagnosis(es):   Class A1 DM, chronic hypertension  G4P2012 [redacted]w[redacted]d Estimated Date of Delivery: 12/20/15  Blood pressure 140/80, pulse 76, weight 341 lb (154.677 kg), last menstrual period 03/15/2015, unknown if currently breastfeeding.  Urinalysis: Positive 1+ glucose   HPI: The patient is being seen today for ongoing management of as above. Today she reports cbg are excellent and BP at home have been good   BP weight and urine results all reviewed and noted. Patient reports good fetal movement, denies any bleeding and no rupture of membranes symptoms or regular contractions.  Fundal Height:  44 Fetal Heart rate:  140 Edema:  1+  Patient is without complaints other than noted in her HPI. All questions were answered.  All lab and sonogram results have been reviewed. Comments:    Assessment:  1.  Pregnancy at [redacted]w[redacted]d,  Estimated Date of Delivery: 12/20/15 :                          2.  Class A1 DM                        3.  Chronci Hypertension   Medication(s) Plans:  Labetalol 400 BID + baby ASA  Treatment Plan:  Twice weekly surveillance, sonogram alternating with NST, induction at 39 weeks or as clinically indicated   No Follow-up on file. for appointment for high risk OB care  No orders of the defined types were placed in this encounter.   Orders Placed This Encounter  Procedures  . POCT urinalysis dipstick

## 2015-11-23 ENCOUNTER — Other Ambulatory Visit: Payer: Self-pay | Admitting: Obstetrics & Gynecology

## 2015-11-23 DIAGNOSIS — O09893 Supervision of other high risk pregnancies, third trimester: Secondary | ICD-10-CM

## 2015-11-23 DIAGNOSIS — O10913 Unspecified pre-existing hypertension complicating pregnancy, third trimester: Secondary | ICD-10-CM

## 2015-11-23 DIAGNOSIS — O24410 Gestational diabetes mellitus in pregnancy, diet controlled: Secondary | ICD-10-CM

## 2015-11-25 ENCOUNTER — Encounter: Payer: Self-pay | Admitting: Obstetrics & Gynecology

## 2015-11-25 ENCOUNTER — Ambulatory Visit (INDEPENDENT_AMBULATORY_CARE_PROVIDER_SITE_OTHER): Payer: BLUE CROSS/BLUE SHIELD | Admitting: Obstetrics & Gynecology

## 2015-11-25 ENCOUNTER — Ambulatory Visit (INDEPENDENT_AMBULATORY_CARE_PROVIDER_SITE_OTHER): Payer: BLUE CROSS/BLUE SHIELD

## 2015-11-25 VITALS — BP 150/100 | HR 110 | Wt 336.0 lb

## 2015-11-25 DIAGNOSIS — O10913 Unspecified pre-existing hypertension complicating pregnancy, third trimester: Secondary | ICD-10-CM | POA: Diagnosis not present

## 2015-11-25 DIAGNOSIS — O24410 Gestational diabetes mellitus in pregnancy, diet controlled: Secondary | ICD-10-CM

## 2015-11-25 DIAGNOSIS — O09893 Supervision of other high risk pregnancies, third trimester: Secondary | ICD-10-CM

## 2015-11-25 DIAGNOSIS — Z141 Cystic fibrosis carrier: Secondary | ICD-10-CM

## 2015-11-25 DIAGNOSIS — O09291 Supervision of pregnancy with other poor reproductive or obstetric history, first trimester: Secondary | ICD-10-CM

## 2015-11-25 DIAGNOSIS — Z8632 Personal history of gestational diabetes: Secondary | ICD-10-CM

## 2015-11-25 DIAGNOSIS — Z98891 History of uterine scar from previous surgery: Secondary | ICD-10-CM

## 2015-11-25 DIAGNOSIS — Z331 Pregnant state, incidental: Secondary | ICD-10-CM

## 2015-11-25 DIAGNOSIS — Z1389 Encounter for screening for other disorder: Secondary | ICD-10-CM

## 2015-11-25 LAB — POCT URINALYSIS DIPSTICK
Blood, UA: NEGATIVE
GLUCOSE UA: NEGATIVE
KETONES UA: NEGATIVE
LEUKOCYTES UA: NEGATIVE
Nitrite, UA: NEGATIVE
PROTEIN UA: NEGATIVE

## 2015-11-25 NOTE — Progress Notes (Signed)
[place] [id number] wks,cephalic, post pl gr 3,afi 15.8cm,RI .48,.54,FHR 138 bpm,efw 4229 g >90%,BPP 8/8

## 2015-11-25 NOTE — Progress Notes (Signed)
Fetal Surveillance Testing today:  BPP 8 out of 8 and fetal Dopplers are excellent   High Risk Pregnancy Diagnosis(es):   Chronic hypertension and class AI diabetes mellitus  [id number] [redacted]w[redacted]d Estimated Date of Delivery: 12/20/15  Blood pressure 150/100, last menstrual period 03/15/2015, unknown if currently breastfeeding.  Urinalysis: Negative   HPI: The patient is being seen today for ongoing management of chronic hypertension and class AI gestational diabetes mellitus. Today she reports blood pressure and all CBGs at home are normal   BP weight and urine results all reviewed and noted. Patient reports good fetal movement, denies any bleeding and no rupture of membranes symptoms or regular contractions.  Fundal Height:  na Fetal Heart rate:  145 Edema:  2+  Patient is without complaints other than noted in her HPI. All questions were answered.  All lab and sonogram results have been reviewed. Comments:    Assessment:  1.  Pregnancy at [redacted]w[redacted]d,  Estimated Date of Delivery: 12/20/15 :                          2.  Chronic hypertension                        3.  A1 gestational diabetes  Medication(s) Plans:  Labetalol 400 mg twice daily  Treatment Plan:  Twice weekly NST sonogram alternating with sonogram plan delivery at [redacted] weeks gestation was clinically indicated  No Follow-up on file. for appointment for high risk OB care  No orders of the defined types were placed in this encounter.   Orders Placed This Encounter  Procedures  . POCT urinalysis dipstick

## 2015-11-29 ENCOUNTER — Encounter: Payer: Self-pay | Admitting: Obstetrics & Gynecology

## 2015-11-29 ENCOUNTER — Ambulatory Visit (INDEPENDENT_AMBULATORY_CARE_PROVIDER_SITE_OTHER): Payer: BLUE CROSS/BLUE SHIELD | Admitting: Obstetrics & Gynecology

## 2015-11-29 VITALS — BP 138/80 | HR 109 | Wt 339.0 lb

## 2015-11-29 DIAGNOSIS — Z3685 Encounter for antenatal screening for Streptococcus B: Secondary | ICD-10-CM

## 2015-11-29 DIAGNOSIS — Z331 Pregnant state, incidental: Secondary | ICD-10-CM

## 2015-11-29 DIAGNOSIS — O24410 Gestational diabetes mellitus in pregnancy, diet controlled: Secondary | ICD-10-CM

## 2015-11-29 DIAGNOSIS — O3663X1 Maternal care for excessive fetal growth, third trimester, fetus 1: Secondary | ICD-10-CM

## 2015-11-29 DIAGNOSIS — Z118 Encounter for screening for other infectious and parasitic diseases: Secondary | ICD-10-CM

## 2015-11-29 DIAGNOSIS — Z1389 Encounter for screening for other disorder: Secondary | ICD-10-CM

## 2015-11-29 DIAGNOSIS — Z1159 Encounter for screening for other viral diseases: Secondary | ICD-10-CM

## 2015-11-29 DIAGNOSIS — O10913 Unspecified pre-existing hypertension complicating pregnancy, third trimester: Secondary | ICD-10-CM | POA: Diagnosis not present

## 2015-11-29 DIAGNOSIS — O09893 Supervision of other high risk pregnancies, third trimester: Secondary | ICD-10-CM

## 2015-11-29 DIAGNOSIS — O34219 Maternal care for unspecified type scar from previous cesarean delivery: Secondary | ICD-10-CM

## 2015-11-29 LAB — POCT URINALYSIS DIPSTICK
Blood, UA: NEGATIVE
Glucose, UA: NEGATIVE
KETONES UA: NEGATIVE
Leukocytes, UA: NEGATIVE
Nitrite, UA: NEGATIVE

## 2015-11-29 NOTE — Progress Notes (Signed)
Fetal Surveillance Testing today:  Reactive NST   High Risk Pregnancy Diagnosis(es):   Class A1 DM, chronic hypertension  G4P2012 [redacted]w[redacted]d Estimated Date of Delivery: 12/20/15  Blood pressure 138/80, pulse 109, weight 339 lb (153.769 kg), last menstrual period 03/15/2015, unknown if currently breastfeeding.  Urinalysis: Negative   HPI: The patient is being seen today for ongoing management of as above. Today she reports cbg and BP at home are good   BP weight and urine results all reviewed and noted. Patient reports good fetal movement, denies any bleeding and no rupture of membranes symptoms or regular contractions.  Fundal Height:  45 Fetal Heart rate:  145 Edema:  1+  Patient is without complaints other than noted in her HPI. All questions were answered.  All lab and sonogram results have been reviewed. Comments: abnormal:    Assessment:  1.  Pregnancy at [redacted]w[redacted]d,  Estimated Date of Delivery: 12/20/15 :                          2.  Chronic Hypertension                        3.  Class A1 DM with fetal macrosomia                        4.  Previous Caesarean section, desires sterilization  Medication(s) Plans:  Labetalol 400 BID  Treatment Plan:  repeat Caesarean section + BTL scheduled for 12/14/2015, successful previous VBAC but with fetal macrosomia, EFW is going to be in neighborhood of 5000 grams, chooses repeat + BTL  No Follow-up on file. for appointment for high risk OB care  No orders of the defined types were placed in this encounter.   Orders Placed This Encounter  Procedures  . GC/Chlamydia Probe Amp  . Strep Gp B NAA  . POCT urinalysis dipstick

## 2015-12-01 LAB — STREP GP B NAA: STREP GROUP B AG: NEGATIVE

## 2015-12-01 LAB — GC/CHLAMYDIA PROBE AMP
Chlamydia trachomatis, NAA: NEGATIVE
NEISSERIA GONORRHOEAE BY PCR: NEGATIVE

## 2015-12-02 ENCOUNTER — Ambulatory Visit (INDEPENDENT_AMBULATORY_CARE_PROVIDER_SITE_OTHER): Payer: BLUE CROSS/BLUE SHIELD | Admitting: Advanced Practice Midwife

## 2015-12-02 ENCOUNTER — Encounter: Payer: Self-pay | Admitting: Advanced Practice Midwife

## 2015-12-02 ENCOUNTER — Ambulatory Visit (INDEPENDENT_AMBULATORY_CARE_PROVIDER_SITE_OTHER): Payer: BLUE CROSS/BLUE SHIELD

## 2015-12-02 VITALS — BP 130/80 | HR 88 | Wt 341.0 lb

## 2015-12-02 DIAGNOSIS — O09893 Supervision of other high risk pregnancies, third trimester: Secondary | ICD-10-CM

## 2015-12-02 DIAGNOSIS — O24410 Gestational diabetes mellitus in pregnancy, diet controlled: Secondary | ICD-10-CM

## 2015-12-02 DIAGNOSIS — O09291 Supervision of pregnancy with other poor reproductive or obstetric history, first trimester: Secondary | ICD-10-CM

## 2015-12-02 DIAGNOSIS — Z141 Cystic fibrosis carrier: Secondary | ICD-10-CM

## 2015-12-02 DIAGNOSIS — Z8632 Personal history of gestational diabetes: Secondary | ICD-10-CM

## 2015-12-02 DIAGNOSIS — Z331 Pregnant state, incidental: Secondary | ICD-10-CM

## 2015-12-02 DIAGNOSIS — Z1389 Encounter for screening for other disorder: Secondary | ICD-10-CM

## 2015-12-02 DIAGNOSIS — Z3A38 38 weeks gestation of pregnancy: Secondary | ICD-10-CM

## 2015-12-02 DIAGNOSIS — O10913 Unspecified pre-existing hypertension complicating pregnancy, third trimester: Secondary | ICD-10-CM

## 2015-12-02 DIAGNOSIS — Z98891 History of uterine scar from previous surgery: Secondary | ICD-10-CM

## 2015-12-02 LAB — POCT URINALYSIS DIPSTICK
GLUCOSE UA: NEGATIVE
KETONES UA: NEGATIVE
Nitrite, UA: NEGATIVE
Protein, UA: NEGATIVE
RBC UA: NEGATIVE

## 2015-12-02 NOTE — Progress Notes (Signed)
Fetal Surveillance Testing today:  [place]   High Risk Pregnancy Diagnosis(es):   CHTN, A1/B DM  [id number] [redacted]w[redacted]d Estimated Date of Delivery: 12/20/15  Blood pressure 130/80, pulse 88, weight 341 lb (154.677 kg), last menstrual period 03/15/2015, unknown if currently breastfeeding.  Urinalysis: Negative   HPI: The patient is being seen today for ongoing management of above. Today she reports Blood sugars are all well within range..    BP weight and urine results all reviewed and noted. Patient reports good fetal movement, denies any bleeding and no rupture of membranes symptoms or regular contractions.   Patient is without complaints other than noted in her HPI. All questions were answered.  All lab and sonogram results have been reviewed. Comments: [place] [id number] wks,cephalic,post pl gr 3,bilat adnexa's wnl,BPP 8/8, FHR 147 bpm,AFI 15.8 cm,RI .53,.54  Assessment: 1. Pregnancy at [redacted]w[redacted]d, Estimated Date of Delivery: 12/20/15 :   2. Chronic Hypertension  3. Class A1 DM with fetal macrosomia  4. Previous Caesarean section, desires sterilization  Medication(s) Plans: Labetalol 400 BID, ASA 81mg  qd         Treatment Plan: Continue twice weekly testing: repeat Caesarean section + BTL scheduled for 12/14/2015, successful previous VBAC but with fetal macrosomia, EFW is going to be in neighborhood of 5000 grams, chooses repeat + BTL   Return for Mondays NST/HROB, Thursdays US/HROB. for appointment for high risk OB care  No orders of the defined types were placed in this encounter.   Orders Placed This Encounter  Procedures  . POCT Urinalysis Dipstick

## 2015-12-02 NOTE — Progress Notes (Signed)
[place] [id number] wks,cephalic,post pl gr 3,bilat adnexa's wnl,BPP 8/8, FHR 147 bpm,AFI 15.8 cm,RI .53,.54

## 2015-12-06 ENCOUNTER — Ambulatory Visit (INDEPENDENT_AMBULATORY_CARE_PROVIDER_SITE_OTHER): Payer: BLUE CROSS/BLUE SHIELD | Admitting: Obstetrics & Gynecology

## 2015-12-06 ENCOUNTER — Encounter: Payer: Self-pay | Admitting: Obstetrics & Gynecology

## 2015-12-06 VITALS — BP 130/80 | HR 106 | Wt 339.0 lb

## 2015-12-06 DIAGNOSIS — O10913 Unspecified pre-existing hypertension complicating pregnancy, third trimester: Secondary | ICD-10-CM | POA: Diagnosis not present

## 2015-12-06 DIAGNOSIS — Z1389 Encounter for screening for other disorder: Secondary | ICD-10-CM

## 2015-12-06 DIAGNOSIS — O24410 Gestational diabetes mellitus in pregnancy, diet controlled: Secondary | ICD-10-CM | POA: Diagnosis not present

## 2015-12-06 DIAGNOSIS — O09893 Supervision of other high risk pregnancies, third trimester: Secondary | ICD-10-CM

## 2015-12-06 DIAGNOSIS — Z331 Pregnant state, incidental: Secondary | ICD-10-CM

## 2015-12-06 LAB — POCT URINALYSIS DIPSTICK
Glucose, UA: NEGATIVE
KETONES UA: NEGATIVE
Leukocytes, UA: NEGATIVE
Nitrite, UA: NEGATIVE
RBC UA: NEGATIVE

## 2015-12-06 NOTE — Progress Notes (Signed)
Fetal Surveillance Testing today:  Reactive NST   High Risk Pregnancy Diagnosis(es):   Chronic hypertension, class a1 dm  [id number] [redacted]w[redacted]d Estimated Date of Delivery: 12/20/15  Blood pressure 130/80, pulse 106, weight 339 lb (153.769 kg), last menstrual period 03/15/2015, unknown if currently breastfeeding.  Urinalysis: Negative   HPI: The patient is being seen today for ongoing management of as above. Today she reports bp and cbg at home have been good   BP weight and urine results all reviewed and noted. Patient reports good fetal movement, denies any bleeding and no rupture of membranes symptoms or regular contractions.  Fundal Height:  45 Fetal Heart rate:  140 Edema:  1+  Patient is without complaints other than noted in her HPI. All questions were answered.  All lab and sonogram results have been reviewed. Comments: abnormal:    Assessment:  1.  Pregnancy at [redacted]w[redacted]d,  Estimated Date of Delivery: 12/20/15 :                          2.  Chronic Hypertension                        3.  Class A1 DM  Medication(s) Plans:  No changes  Treatment Plan:  Twice weekly surveillance, sonogram alternating with NST, repeat Caesarean section with BTL at 39 weeks or as clinically indicated   Return in about 3 days (around 12/09/2015) for HROB, BPP/sono, with Dr Elonda Husky. for appointment for high risk OB care  No orders of the defined types were placed in this encounter.   Orders Placed This Encounter  Procedures  . US Fetal BPP W/O Non Stress  . [place] UA Cord Doppler  . POCT urinalysis dipstick

## 2015-12-07 ENCOUNTER — Other Ambulatory Visit: Payer: Self-pay | Admitting: Obstetrics & Gynecology

## 2015-12-07 ENCOUNTER — Encounter (HOSPITAL_COMMUNITY): Payer: Self-pay

## 2015-12-07 ENCOUNTER — Telehealth (HOSPITAL_COMMUNITY): Payer: Self-pay | Admitting: *Deleted

## 2015-12-07 DIAGNOSIS — O24410 Gestational diabetes mellitus in pregnancy, diet controlled: Secondary | ICD-10-CM

## 2015-12-07 DIAGNOSIS — O10913 Unspecified pre-existing hypertension complicating pregnancy, third trimester: Secondary | ICD-10-CM

## 2015-12-07 NOTE — Telephone Encounter (Signed)
Preadmission screen  

## 2015-12-09 ENCOUNTER — Ambulatory Visit (INDEPENDENT_AMBULATORY_CARE_PROVIDER_SITE_OTHER): Payer: BLUE CROSS/BLUE SHIELD | Admitting: Obstetrics & Gynecology

## 2015-12-09 ENCOUNTER — Ambulatory Visit (INDEPENDENT_AMBULATORY_CARE_PROVIDER_SITE_OTHER): Payer: BLUE CROSS/BLUE SHIELD

## 2015-12-09 ENCOUNTER — Encounter: Payer: Self-pay | Admitting: Obstetrics & Gynecology

## 2015-12-09 VITALS — BP 140/100 | HR 88 | Wt 340.0 lb

## 2015-12-09 DIAGNOSIS — Z1389 Encounter for screening for other disorder: Secondary | ICD-10-CM

## 2015-12-09 DIAGNOSIS — O24410 Gestational diabetes mellitus in pregnancy, diet controlled: Secondary | ICD-10-CM

## 2015-12-09 DIAGNOSIS — Z98891 History of uterine scar from previous surgery: Secondary | ICD-10-CM

## 2015-12-09 DIAGNOSIS — Z141 Cystic fibrosis carrier: Secondary | ICD-10-CM

## 2015-12-09 DIAGNOSIS — O10913 Unspecified pre-existing hypertension complicating pregnancy, third trimester: Secondary | ICD-10-CM

## 2015-12-09 DIAGNOSIS — O09291 Supervision of pregnancy with other poor reproductive or obstetric history, first trimester: Secondary | ICD-10-CM

## 2015-12-09 DIAGNOSIS — O09893 Supervision of other high risk pregnancies, third trimester: Secondary | ICD-10-CM

## 2015-12-09 DIAGNOSIS — Z8632 Personal history of gestational diabetes: Secondary | ICD-10-CM

## 2015-12-09 DIAGNOSIS — Z331 Pregnant state, incidental: Secondary | ICD-10-CM

## 2015-12-09 LAB — POCT URINALYSIS DIPSTICK
Blood, UA: NEGATIVE
Glucose, UA: NEGATIVE
KETONES UA: NEGATIVE
LEUKOCYTES UA: NEGATIVE
Nitrite, UA: NEGATIVE

## 2015-12-09 NOTE — Progress Notes (Signed)
Fetal Surveillance Testing today:  BPP 8/8, Dopplers excellent, EFW >90%   High Risk Pregnancy Diagnosis(es):   Chronic hypertension and class AI diabetes mellitus  [id number] [redacted]w[redacted]d Estimated Date of Delivery: 12/20/15  Blood pressure 140/100, pulse 88, weight 340 lb (154.223 kg), last menstrual period 03/15/2015, not currently breastfeeding.  Urinalysis: Negative   HPI: The patient is being seen today for ongoing management of chronic hypertension and class a 1 diabetes mellitus. Today she reports blood pressures and CBGs at home have all been normal   BP weight and urine results all reviewed and noted. Patient reports good fetal movement, denies any bleeding and no rupture of membranes symptoms or regular contractions.  Fundal Height:  45 cm Fetal Heart rate:  144 Edema:  Stable 1+  Patient is without complaints other than noted in her HPI. All questions were answered.  All lab and sonogram results have been reviewed. Comments: abnormal: 2 hour   Assessment:  1.  Pregnancy at [redacted]w[redacted]d,  Estimated Date of Delivery: 12/20/15 :                          2.  Chronic hypertension                        3.  Gestational diabetes with fetal macrosomia                        4.  Previous cesarean section desires sterilization  Medication(s) Plans:  Beta loss 400 mg twice a day  Treatment Plan:  NST later this week and plans for a repeat cesarean section with a bilateral tubal ligation on May 9  No Follow-up on file. for appointment for high risk OB care  No orders of the defined types were placed in this encounter.   Orders Placed This Encounter  Procedures  . POCT urinalysis dipstick

## 2015-12-09 NOTE — Progress Notes (Signed)
[place] 38+3 wks,fhr 123456 bpm,cephalic,BPP [id number] .[id number] 21 cm,post pl gr 3,EFW 3893 89%, poorly visualized AC,limited AC measurement

## 2015-12-13 ENCOUNTER — Other Ambulatory Visit: Payer: Self-pay | Admitting: Obstetrics & Gynecology

## 2015-12-13 ENCOUNTER — Encounter (HOSPITAL_COMMUNITY)
Admission: RE | Admit: 2015-12-13 | Discharge: 2015-12-13 | Disposition: A | Discharge status: Home / Self Care | Payer: BLUE CROSS/BLUE SHIELD | Source: Ambulatory Visit | Attending: Obstetrics & Gynecology | Admitting: Obstetrics & Gynecology

## 2015-12-13 DIAGNOSIS — Z029 Encounter for administrative examinations, unspecified: Secondary | ICD-10-CM

## 2015-12-13 LAB — BASIC METABOLIC PANEL
Anion gap: 9 (ref 5–15)
BUN: 8 mg/dL (ref 6–20)
CHLORIDE: 107 mmol/L (ref 101–111)
CO2: 20 mmol/L — AB (ref 22–32)
CREATININE: 0.69 mg/dL (ref 0.44–1.00)
Calcium: 8.9 mg/dL (ref 8.9–10.3)
GFR calc Af Amer: >60 mL/min (ref >60)
GFR calc non Af Amer: >60 mL/min (ref >60)
GLUCOSE: 142 mg/dL — AB (ref 65–99)
Potassium: 3.8 mmol/L (ref 3.5–5.1)
SODIUM: 136 mmol/L (ref 135–145)

## 2015-12-13 LAB — CBC
HEMATOCRIT: 30.2 % — AB (ref 36.0–46.0)
HEMOGLOBIN: 9.8 g/dL — AB (ref 12.0–15.0)
MCH: 25.7 pg — AB (ref 26.0–34.0)
MCHC: 32.5 g/dL (ref 30.0–36.0)
MCV: 79.1 fL (ref 78.0–100.0)
Platelets: 242 10*3/uL (ref 150–400)
RBC: 3.82 MIL/uL — ABNORMAL LOW (ref 3.87–5.11)
RDW: 16.3 % — ABNORMAL HIGH (ref 11.5–15.5)
WBC: 9 10*3/uL (ref 4.0–10.5)

## 2015-12-13 NOTE — Patient Instructions (Addendum)
[place]  12/13/2015   Your procedure is scheduled on:  12/14/2015  Enter through the Main Entrance of [facility] at [place] up the phone at the desk and dial [phone].   Call this number if you have problems the morning of surgery: [phone]   Remember:   Do not eat food:After Midnight.  Do not drink clear liquids: After Midnight.  Take these medicines the morning of surgery with A SIP OF WATER: labetolol   Do not wear jewelry, make-up or nail polish.  Do not wear lotions, powders, or perfumes. You may wear deodorant.  Do not shave 48 hours prior to surgery.  Do not bring valuables to the hospital.  [facility] is not   responsible for any belongings or valuables brought to the hospital.  Contacts, dentures or bridgework may not be worn into surgery.  Leave suitcase in the car. After surgery it may be brought to your room.  For patients admitted to the hospital, checkout time is 11:00 AM the day of              discharge.   Patients discharged the day of surgery will not be allowed to drive             Name and phone number of your driver:   Special Instructions:   Shower using CHG 2 nights before surgery and the night before surgery.  If you shower the day of surgery use CHG.  Use special wash - you have one bottle of CHG for all showers.  You should use approximately 1/3 of the bottle for each shower.   Please read over the following fact sheets that you were given:   Surgical Site Infection Prevention

## 2015-12-14 ENCOUNTER — Encounter (HOSPITAL_COMMUNITY)
Admission: RE | Disposition: A | Discharge status: Home / Self Care | Payer: Self-pay | Source: Ambulatory Visit | Attending: Obstetrics & Gynecology

## 2015-12-14 ENCOUNTER — Inpatient Hospital Stay (HOSPITAL_COMMUNITY)
Admission: RE | Admit: 2015-12-14 | Discharge: 2015-12-16 | DRG: 765 | Disposition: A | Discharge status: Home / Self Care | Payer: BLUE CROSS/BLUE SHIELD | Source: Ambulatory Visit | Attending: Obstetrics & Gynecology | Admitting: Obstetrics & Gynecology

## 2015-12-14 ENCOUNTER — Inpatient Hospital Stay (HOSPITAL_COMMUNITY): Payer: BLUE CROSS/BLUE SHIELD | Admitting: Anesthesiology

## 2015-12-14 ENCOUNTER — Encounter (HOSPITAL_COMMUNITY): Payer: Self-pay | Admitting: Certified Registered Nurse Anesthetist

## 2015-12-14 DIAGNOSIS — Z6841 Body Mass Index (BMI) 40.0 and over, adult: Secondary | ICD-10-CM

## 2015-12-14 DIAGNOSIS — Z302 Encounter for sterilization: Secondary | ICD-10-CM | POA: Diagnosis not present

## 2015-12-14 DIAGNOSIS — O9902 Anemia complicating childbirth: Secondary | ICD-10-CM | POA: Diagnosis present

## 2015-12-14 DIAGNOSIS — O1002 Pre-existing essential hypertension complicating childbirth: Secondary | ICD-10-CM | POA: Diagnosis present

## 2015-12-14 DIAGNOSIS — Z98891 History of uterine scar from previous surgery: Secondary | ICD-10-CM

## 2015-12-14 DIAGNOSIS — Z141 Cystic fibrosis carrier: Secondary | ICD-10-CM

## 2015-12-14 DIAGNOSIS — Z8249 Family history of ischemic heart disease and other diseases of the circulatory system: Secondary | ICD-10-CM

## 2015-12-14 DIAGNOSIS — D649 Anemia, unspecified: Secondary | ICD-10-CM | POA: Diagnosis present

## 2015-12-14 DIAGNOSIS — Z3A39 39 weeks gestation of pregnancy: Secondary | ICD-10-CM | POA: Diagnosis not present

## 2015-12-14 DIAGNOSIS — O34211 Maternal care for low transverse scar from previous cesarean delivery: Secondary | ICD-10-CM | POA: Diagnosis present

## 2015-12-14 DIAGNOSIS — O34219 Maternal care for unspecified type scar from previous cesarean delivery: Secondary | ICD-10-CM

## 2015-12-14 DIAGNOSIS — O99214 Obesity complicating childbirth: Secondary | ICD-10-CM | POA: Diagnosis present

## 2015-12-14 DIAGNOSIS — O24420 Gestational diabetes mellitus in childbirth, diet controlled: Secondary | ICD-10-CM | POA: Diagnosis present

## 2015-12-14 DIAGNOSIS — Z833 Family history of diabetes mellitus: Secondary | ICD-10-CM | POA: Diagnosis not present

## 2015-12-14 DIAGNOSIS — Z8632 Personal history of gestational diabetes: Secondary | ICD-10-CM

## 2015-12-14 DIAGNOSIS — O1092 Unspecified pre-existing hypertension complicating childbirth: Secondary | ICD-10-CM

## 2015-12-14 DIAGNOSIS — O09291 Supervision of pregnancy with other poor reproductive or obstetric history, first trimester: Secondary | ICD-10-CM

## 2015-12-14 DIAGNOSIS — O3663X0 Maternal care for excessive fetal growth, third trimester, not applicable or unspecified: Secondary | ICD-10-CM | POA: Diagnosis present

## 2015-12-14 HISTORY — PX: TUBAL LIGATION: SHX77

## 2015-12-14 LAB — GLUCOSE, CAPILLARY: Glucose-Capillary: 93 mg/dL (ref 65–99)

## 2015-12-14 LAB — PREPARE RBC (CROSSMATCH)

## 2015-12-14 LAB — RPR: RPR Ser Ql: NONREACTIVE

## 2015-12-14 SURGERY — Surgical Case
Anesthesia: Spinal

## 2015-12-14 MED ORDER — IBUPROFEN 600 MG PO TABS
600.0000 mg | ORAL_TABLET | Freq: Four times a day (QID) | ORAL | Status: DC
  Administered 2015-12-14 – 2015-12-16 (×7): 600 mg via ORAL
  Filled 2015-12-14 (×7): qty 1

## 2015-12-14 MED ORDER — FENTANYL CITRATE (PF) 100 MCG/2ML IJ SOLN: 25.0000 ug | INTRAMUSCULAR | Status: DC | PRN

## 2015-12-14 MED ORDER — MENTHOL 3 MG MT LOZG: 1.0000 | LOZENGE | OROMUCOSAL | Status: DC | PRN

## 2015-12-14 MED ORDER — ONDANSETRON HCL 4 MG/2ML IJ SOLN
INTRAMUSCULAR | Status: DC | PRN
  Administered 2015-12-14: 4 mg via INTRAVENOUS

## 2015-12-14 MED ORDER — FENTANYL CITRATE (PF) 100 MCG/2ML IJ SOLN
INTRAMUSCULAR | Status: AC
  Filled 2015-12-14: qty 2

## 2015-12-14 MED ORDER — NALBUPHINE HCL 10 MG/ML IJ SOLN: 5.0000 mg | Freq: Once | INTRAMUSCULAR | Status: DC | PRN

## 2015-12-14 MED ORDER — CEFAZOLIN SODIUM-DEXTROSE 2-4 GM/100ML-% IV SOLN
2.0000 g | INTRAVENOUS | Status: DC
  Filled 2015-12-14: qty 100

## 2015-12-14 MED ORDER — OXYCODONE-ACETAMINOPHEN 5-325 MG PO TABS: 2.0000 | ORAL_TABLET | ORAL | Status: DC | PRN

## 2015-12-14 MED ORDER — ONDANSETRON HCL 4 MG/2ML IJ SOLN: 4.0000 mg | Freq: Three times a day (TID) | INTRAMUSCULAR | Status: DC | PRN

## 2015-12-14 MED ORDER — OXYTOCIN 10 UNIT/ML IJ SOLN
INTRAMUSCULAR | Status: AC
  Filled 2015-12-14: qty 4

## 2015-12-14 MED ORDER — PHENYLEPHRINE 8 MG IN D5W 100 ML (0.08MG/ML) PREMIX OPTIME
INJECTION | INTRAVENOUS | Status: AC
  Filled 2015-12-14: qty 100

## 2015-12-14 MED ORDER — SCOPOLAMINE 1 MG/3DAYS TD PT72
MEDICATED_PATCH | TRANSDERMAL | Status: AC
  Administered 2015-12-14: 1.5 mg via TRANSDERMAL
  Filled 2015-12-14: qty 1

## 2015-12-14 MED ORDER — NALOXONE HCL 2 MG/2ML IJ SOSY
1.0000 ug/kg/h | PREFILLED_SYRINGE | INTRAVENOUS | Status: DC | PRN
  Filled 2015-12-14: qty 2

## 2015-12-14 MED ORDER — ASPIRIN 81 MG PO CHEW
81.0000 mg | CHEWABLE_TABLET | Freq: Every day | ORAL | Status: DC
  Administered 2015-12-15 – 2015-12-16 (×2): 81 mg via ORAL
  Filled 2015-12-14 (×2): qty 1

## 2015-12-14 MED ORDER — DIPHENHYDRAMINE HCL 25 MG PO CAPS: 25.0000 mg | ORAL_CAPSULE | ORAL | Status: DC | PRN

## 2015-12-14 MED ORDER — LABETALOL HCL 200 MG PO TABS
400.0000 mg | ORAL_TABLET | Freq: Two times a day (BID) | ORAL | Status: DC
  Administered 2015-12-14 – 2015-12-16 (×4): 400 mg via ORAL
  Filled 2015-12-14 (×4): qty 2

## 2015-12-14 MED ORDER — ONDANSETRON HCL 4 MG/2ML IJ SOLN: 4.0000 mg | Freq: Once | INTRAMUSCULAR | Status: DC | PRN

## 2015-12-14 MED ORDER — PRENATAL MULTIVITAMIN CH
1.0000 | ORAL_TABLET | Freq: Every day | ORAL | Status: DC
  Administered 2015-12-14 – 2015-12-15 (×2): 1 via ORAL
  Filled 2015-12-14 (×2): qty 1

## 2015-12-14 MED ORDER — DIBUCAINE 1 % RE OINT: 1.0000 "application " | TOPICAL_OINTMENT | RECTAL | Status: DC | PRN

## 2015-12-14 MED ORDER — OXYTOCIN 10 UNIT/ML IJ SOLN
40.0000 [IU] | INTRAMUSCULAR | Status: DC | PRN
  Administered 2015-12-14: 40 [IU] via INTRAVENOUS

## 2015-12-14 MED ORDER — KETOROLAC TROMETHAMINE 30 MG/ML IJ SOLN: 30.0000 mg | Freq: Four times a day (QID) | INTRAMUSCULAR | Status: AC | PRN

## 2015-12-14 MED ORDER — LACTATED RINGERS IV SOLN: 2.5000 [IU]/h | INTRAVENOUS | Status: AC

## 2015-12-14 MED ORDER — LACTATED RINGERS IV SOLN
INTRAVENOUS | Status: DC | PRN
  Administered 2015-12-14: 08:00:00 via INTRAVENOUS

## 2015-12-14 MED ORDER — PHENYLEPHRINE 40 MCG/ML (10ML) SYRINGE FOR IV PUSH (FOR BLOOD PRESSURE SUPPORT)
PREFILLED_SYRINGE | INTRAVENOUS | Status: AC
  Filled 2015-12-14: qty 10

## 2015-12-14 MED ORDER — NALBUPHINE HCL 10 MG/ML IJ SOLN: 5.0000 mg | INTRAMUSCULAR | Status: DC | PRN

## 2015-12-14 MED ORDER — SCOPOLAMINE 1 MG/3DAYS TD PT72
1.0000 | MEDICATED_PATCH | Freq: Once | TRANSDERMAL | Status: DC
  Administered 2015-12-14: 1.5 mg via TRANSDERMAL

## 2015-12-14 MED ORDER — ACETAMINOPHEN 325 MG PO TABS: 650.0000 mg | ORAL_TABLET | ORAL | Status: DC | PRN

## 2015-12-14 MED ORDER — COCONUT OIL OIL: 1.0000 "application " | TOPICAL_OIL | Status: DC | PRN

## 2015-12-14 MED ORDER — MORPHINE SULFATE (PF) 0.5 MG/ML IJ SOLN
INTRAMUSCULAR | Status: DC | PRN
  Administered 2015-12-14: .2 mg via INTRATHECAL

## 2015-12-14 MED ORDER — DIPHENHYDRAMINE HCL 50 MG/ML IJ SOLN: 12.5000 mg | INTRAMUSCULAR | Status: DC | PRN

## 2015-12-14 MED ORDER — PHENYLEPHRINE HCL 10 MG/ML IJ SOLN
INTRAMUSCULAR | Status: DC | PRN
  Administered 2015-12-14 (×2): 80 ug via INTRAVENOUS

## 2015-12-14 MED ORDER — DIPHENHYDRAMINE HCL 25 MG PO CAPS: 25.0000 mg | ORAL_CAPSULE | Freq: Four times a day (QID) | ORAL | Status: DC | PRN

## 2015-12-14 MED ORDER — MORPHINE SULFATE (PF) 0.5 MG/ML IJ SOLN
INTRAMUSCULAR | Status: AC
  Filled 2015-12-14: qty 10

## 2015-12-14 MED ORDER — METHYLERGONOVINE MALEATE 0.2 MG/ML IJ SOLN: 0.2000 mg | INTRAMUSCULAR | Status: DC | PRN

## 2015-12-14 MED ORDER — FENTANYL CITRATE (PF) 100 MCG/2ML IJ SOLN
INTRAMUSCULAR | Status: DC | PRN
  Administered 2015-12-14: 10 ug via INTRATHECAL

## 2015-12-14 MED ORDER — OXYCODONE-ACETAMINOPHEN 5-325 MG PO TABS: 1.0000 | ORAL_TABLET | ORAL | Status: DC | PRN

## 2015-12-14 MED ORDER — SENNOSIDES-DOCUSATE SODIUM 8.6-50 MG PO TABS
2.0000 | ORAL_TABLET | ORAL | Status: DC
  Administered 2015-12-15 (×2): 2 via ORAL
  Filled 2015-12-14 (×2): qty 2

## 2015-12-14 MED ORDER — LACTATED RINGERS IV SOLN
INTRAVENOUS | Status: DC
  Administered 2015-12-14: 13:00:00 via INTRAVENOUS

## 2015-12-14 MED ORDER — MEPERIDINE HCL 25 MG/ML IJ SOLN: 6.2500 mg | INTRAMUSCULAR | Status: DC | PRN

## 2015-12-14 MED ORDER — TETANUS-DIPHTH-ACELL PERTUSSIS 5-2.5-18.5 LF-MCG/0.5 IM SUSP: 0.5000 mL | Freq: Once | INTRAMUSCULAR | Status: DC

## 2015-12-14 MED ORDER — SODIUM CHLORIDE 0.9% FLUSH: 3.0000 mL | INTRAVENOUS | Status: DC | PRN

## 2015-12-14 MED ORDER — SIMETHICONE 80 MG PO CHEW
80.0000 mg | CHEWABLE_TABLET | ORAL | Status: DC
  Administered 2015-12-15 (×2): 80 mg via ORAL
  Filled 2015-12-14 (×2): qty 1

## 2015-12-14 MED ORDER — ZOLPIDEM TARTRATE 5 MG PO TABS: 5.0000 mg | ORAL_TABLET | Freq: Every evening | ORAL | Status: DC | PRN

## 2015-12-14 MED ORDER — DEXTROSE 5 % IV SOLN
3.0000 g | Freq: Once | INTRAVENOUS | Status: AC
  Administered 2015-12-14: 3 g via INTRAVENOUS
  Filled 2015-12-14: qty 3000

## 2015-12-14 MED ORDER — SIMETHICONE 80 MG PO CHEW: 80.0000 mg | CHEWABLE_TABLET | ORAL | Status: DC | PRN

## 2015-12-14 MED ORDER — SCOPOLAMINE 1 MG/3DAYS TD PT72: 1.0000 | MEDICATED_PATCH | Freq: Once | TRANSDERMAL | Status: DC

## 2015-12-14 MED ORDER — KETOROLAC TROMETHAMINE 30 MG/ML IJ SOLN
INTRAMUSCULAR | Status: AC
  Filled 2015-12-14: qty 1

## 2015-12-14 MED ORDER — LACTATED RINGERS IV SOLN
Freq: Once | INTRAVENOUS | Status: AC
  Administered 2015-12-14: 06:00:00 via INTRAVENOUS

## 2015-12-14 MED ORDER — KETOROLAC TROMETHAMINE 30 MG/ML IJ SOLN
30.0000 mg | Freq: Four times a day (QID) | INTRAMUSCULAR | Status: AC | PRN
Start: 2015-12-14 — End: 2015-12-15
  Administered 2015-12-14: 30 mg via INTRAMUSCULAR

## 2015-12-14 MED ORDER — ACETAMINOPHEN 500 MG PO TABS
1000.0000 mg | ORAL_TABLET | Freq: Four times a day (QID) | ORAL | Status: AC
  Filled 2015-12-14: qty 2

## 2015-12-14 MED ORDER — LACTATED RINGERS IV SOLN
INTRAVENOUS | Status: DC | PRN
  Administered 2015-12-14 (×4): via INTRAVENOUS

## 2015-12-14 MED ORDER — ONDANSETRON HCL 4 MG/2ML IJ SOLN
INTRAMUSCULAR | Status: AC
  Filled 2015-12-14: qty 2

## 2015-12-14 MED ORDER — BUPIVACAINE IN DEXTROSE 0.75-8.25 % IT SOLN
INTRATHECAL | Status: DC | PRN
  Administered 2015-12-14: 1.6 mL via INTRATHECAL

## 2015-12-14 MED ORDER — SIMETHICONE 80 MG PO CHEW
80.0000 mg | CHEWABLE_TABLET | Freq: Three times a day (TID) | ORAL | Status: DC
  Administered 2015-12-14 – 2015-12-16 (×6): 80 mg via ORAL
  Filled 2015-12-14 (×6): qty 1

## 2015-12-14 MED ORDER — METHYLERGONOVINE MALEATE 0.2 MG PO TABS: 0.2000 mg | ORAL_TABLET | ORAL | Status: DC | PRN

## 2015-12-14 MED ORDER — SODIUM CHLORIDE 0.9 % IR SOLN
Status: DC | PRN
  Administered 2015-12-14: 1

## 2015-12-14 MED ORDER — WITCH HAZEL-GLYCERIN EX PADS: 1.0000 "application " | MEDICATED_PAD | CUTANEOUS | Status: DC | PRN

## 2015-12-14 MED ORDER — PHENYLEPHRINE 8 MG IN D5W 100 ML (0.08MG/ML) PREMIX OPTIME
INJECTION | INTRAVENOUS | Status: DC | PRN
  Administered 2015-12-14: 40 ug/min via INTRAVENOUS

## 2015-12-14 MED ORDER — NALOXONE HCL 0.4 MG/ML IJ SOLN: 0.4000 mg | INTRAMUSCULAR | Status: DC | PRN

## 2015-12-14 SURGICAL SUPPLY — 37 items
CLAMP CORD UMBIL (MISCELLANEOUS) IMPLANT
CLOTH BEACON ORANGE TIMEOUT ST (SAFETY) ×4 IMPLANT
DRSG OPSITE POSTOP 4X10 (GAUZE/BANDAGES/DRESSINGS) ×4 IMPLANT
DURAPREP 26ML APPLICATOR (WOUND CARE) ×8 IMPLANT
ELECT REM PT RETURN 9FT ADLT (ELECTROSURGICAL) ×4
ELECTRODE REM PT RTRN 9FT ADLT (ELECTROSURGICAL) ×2 IMPLANT
EXTRACTOR VACUUM BELL STYLE (SUCTIONS) ×2 IMPLANT
GLOVE BIOGEL PI IND STRL 7.0 (GLOVE) ×2 IMPLANT
GLOVE BIOGEL PI IND STRL 8 (GLOVE) ×2 IMPLANT
GLOVE BIOGEL PI INDICATOR 7.0 (GLOVE) ×2
GLOVE BIOGEL PI INDICATOR 8 (GLOVE) ×2
GLOVE ECLIPSE 8.0 STRL XLNG CF (GLOVE) ×4 IMPLANT
GOWN STRL REUS W/TWL LRG LVL3 (GOWN DISPOSABLE) ×8 IMPLANT
KIT ABG SYR 3ML LUER SLIP (SYRINGE) ×4 IMPLANT
LIQUID BAND (GAUZE/BANDAGES/DRESSINGS) ×6 IMPLANT
NDL HYPO 18GX1.5 BLUNT FILL (NEEDLE) ×2 IMPLANT
NDL HYPO 25X5/8 SAFETYGLIDE (NEEDLE) ×2 IMPLANT
NEEDLE HYPO 18GX1.5 BLUNT FILL (NEEDLE) ×4 IMPLANT
NEEDLE HYPO 22GX1.5 SAFETY (NEEDLE) ×4 IMPLANT
NEEDLE HYPO 25X5/8 SAFETYGLIDE (NEEDLE) ×4 IMPLANT
NS IRRIG 1000ML POUR BTL (IV SOLUTION) ×4 IMPLANT
PACK C SECTION WH (CUSTOM PROCEDURE TRAY) ×4 IMPLANT
PAD OB MATERNITY 4.3X12.25 (PERSONAL CARE ITEMS) ×4 IMPLANT
PENCIL SMOKE EVAC W/HOLSTER (ELECTROSURGICAL) ×4 IMPLANT
RTRCTR C-SECT PINK 25CM LRG (MISCELLANEOUS) IMPLANT
SUT CHROMIC 0 CT 1 (SUTURE) ×4 IMPLANT
SUT MNCRL 0 VIOLET CTX 36 (SUTURE) ×4 IMPLANT
SUT MONOCRYL 0 CTX 36 (SUTURE) ×4
SUT PLAIN 2 0 (SUTURE) ×8
SUT PLAIN 2 0 XLH (SUTURE) IMPLANT
SUT PLAIN ABS 2-0 CT1 27XMFL (SUTURE) ×4 IMPLANT
SUT VIC AB 0 CTX 36 (SUTURE) ×4
SUT VIC AB 0 CTX36XBRD ANBCTRL (SUTURE) ×2 IMPLANT
SUT VIC AB 4-0 KS 27 (SUTURE) IMPLANT
SYR 20CC LL (SYRINGE) ×8 IMPLANT
TOWEL OR 17X24 6PK STRL BLUE (TOWEL DISPOSABLE) ×4 IMPLANT
TRAY FOLEY CATH SILVER 14FR (SET/KITS/TRAYS/PACK) IMPLANT

## 2015-12-14 NOTE — Progress Notes (Signed)
Patient ID: [name], female   DOB: 1985-10-04, 30 y.o.   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

## 2015-12-14 NOTE — Transfer of Care (Signed)
Immediate Anesthesia Transfer of Care Note  Patient: [name]  Procedure(s) Performed: Procedure(s): CESAREAN SECTION (N/A) BILATERAL TUBAL LIGATION (Bilateral)  Patient Location: PACU  Anesthesia Type:Spinal  Level of Consciousness: awake, alert , oriented and patient cooperative  Airway & Oxygen Therapy: Patient Spontanous Breathing  Post-op Assessment: Report given to RN and Post -op Vital signs reviewed and stable  Post vital signs: Reviewed and stable  Last Vitals:  Filed Vitals:   12/14/15 0612 12/14/15 0831  BP:  129/69  Pulse: 93 86  Temp: 36.7 C 36.5 C  Resp: 20 18    Last Pain: There were no vitals filed for this visit.    Patients Stated Pain Goal: 4 ([id number])  Complications: No apparent anesthesia complications

## 2015-12-14 NOTE — H&P (Signed)
Preoperative History and Physical  [name] is a 30 y.o. [phone] with Patient's last menstrual period was 03/15/2015 (approximate). admitted for a repeat Caesarean section and bilateral tubal ligation.  Previous Caesarean section Class A2 DM with suspected fetal macrosomia, had VBAC but weight was 6 lb 4 oz EFW 4500 grams so opts for repeat due to 25% risk of shoulder dystocia Also chronic hypertension  PMH:    Past Medical History  Diagnosis Date  . Granuloma, skin     incisional pfannenstiel scar  . Anemia   . Preterm labor   . Hypertension   . Gestational diabetes mellitus, antepartum   . Supervision of other high-risk pregnancy 05/26/2015     [place]  Initiated Care at   35 weeks  FOB Lilla Shook  Dating By LMP/8 wek [place]  Pap 05/26/2015 neg  GC/CT Initial: -/-               36+wks:  Genetic Screen NT/IT: neg  CF screen Pos  FOB:  Anatomic [place] Female, limited view of head, repeat:  Flu vaccine 05/26/2015   Tdap Recommended ~ 28wks  Glucose Screen  2 hr  GBS   Feed Preference bottle  Contraception undecided  Circumcision yes  Childbirth Classes declined  Pediatrician [place]      . Gestational diabetes     diet controlled    PSH:     Past Surgical History  Procedure Laterality Date  . Cesarean section    . Nasal sinus surgery  2007  . Tonsillectomy  2008  . Endometrial fulguration  07/07/11  . Abdominal surgery      TUMMY TUCK EXCISED ENDOMETRIOSIS    POb/GynH:      OB History    Gravida Para Term Preterm AB TAB SAB Ectopic Multiple Living   4 2 2  1  1   0 2      SH:   Social History  Substance Use Topics  . Smoking status: Never Smoker   . Smokeless tobacco: Never Used  . Alcohol Use: No    FH:    Family History  Problem Relation Age of Onset  . Diabetes Mother   . Hypertension Mother   . Heart disease Father   . Cancer Paternal Uncle   . Cancer Paternal Grandmother      Allergies:  Allergies  Allergen Reactions  .  Adhesive [Tape] Other (See Comments)    Causes blistering of skin  . Promethazine Hcl Nausea And Vomiting  . Vicodin [Hydrocodone-Acetaminophen] Nausea And Vomiting    Medications:       Current facility-administered medications:  .  ceFAZolin (ANCEF) 3 g in dextrose 5 % 50 mL IVPB, 3 g, Intravenous, Once, Florian Buff, MD .  scopolamine (TRANSDERM-SCOP) 1 MG/3DAYS 1.5 mg, 1 patch, Transdermal, Once, Lauretta Grill, MD, 1.5 mg at 12/14/15 [id number]  Review of Systems:   Review of Systems  Constitutional: Negative for fever, chills, weight loss, malaise/fatigue and diaphoresis.  HENT: Negative for hearing loss, ear pain, nosebleeds, congestion, sore throat, neck pain, tinnitus and ear discharge.   Eyes: Negative for blurred vision, double vision, photophobia, pain, discharge and redness.  Respiratory: Negative for cough, hemoptysis, sputum production, shortness of breath, wheezing and stridor.   Cardiovascular: Negative for chest pain, palpitations, orthopnea, claudication, leg swelling and PND.  Gastrointestinal: Positive for abdominal pain. Negative for heartburn, nausea, vomiting, diarrhea, constipation, blood in stool and melena.  Genitourinary: Negative for dysuria, urgency, frequency, hematuria  and flank pain.  Musculoskeletal: Negative for myalgias, back pain, joint pain and falls.  Skin: Negative for itching and rash.  Neurological: Negative for dizziness, tingling, tremors, sensory change, speech change, focal weakness, seizures, loss of consciousness, weakness and headaches.  Endo/Heme/Allergies: Negative for environmental allergies and polydipsia. Does not bruise/bleed easily.  Psychiatric/Behavioral: Negative for depression, suicidal ideas, hallucinations, memory loss and substance abuse. The patient is not nervous/anxious and does not have insomnia.      PHYSICAL EXAM:  Pulse 93, temperature 98 F (36.7 C), temperature source Oral, resp. rate 20, last menstrual period 03/15/2015,  SpO2 99 %, not currently breastfeeding.    Vitals reviewed. Constitutional: She is oriented to person, place, and time. She appears well-developed and well-nourished.  HENT:  Head: Normocephalic and atraumatic.  Right Ear: External ear normal.  Left Ear: External ear normal.  Nose: Nose normal.  Mouth/Throat: Oropharynx is clear and moist.  Eyes: Conjunctivae and EOM are normal. Pupils are equal, round, and reactive to light. Right eye exhibits no discharge. Left eye exhibits no discharge. No scleral icterus.  Neck: Normal range of motion. Neck supple. No tracheal deviation present. No thyromegaly present.  Cardiovascular: Normal rate, regular rhythm, normal heart sounds and intact distal pulses.  Exam reveals no gallop and no friction rub.   No murmur heard. Respiratory: Effort normal and breath sounds normal. No respiratory distress. She has no wheezes. She has no rales. She exhibits no tenderness.  GI: Soft. Bowel sounds are normal. She exhibits no distension and no mass. There is tenderness. There is no rebound and no guarding.  Genitourinary:       Vulva is normal without lesions Vagina is pink moist without discharge Cervix normal in appearance and pap is normal Uterus is gravid Adnexa is negative with normal sized ovaries by sonogram  Musculoskeletal: Normal range of motion. She exhibits no edema and no tenderness.  Neurological: She is alert and oriented to person, place, and time. She has normal reflexes. She displays normal reflexes. No cranial nerve deficit. She exhibits normal muscle tone. Coordination normal.  Skin: Skin is warm and dry. No rash noted. No erythema. No pallor.  Psychiatric: She has a normal mood and affect. Her behavior is normal. Judgment and thought content normal.    Labs: Results for orders placed or performed during the hospital encounter of 12/14/15 (from the past 336 hour(s))  Prepare RBC (crossmatch)   Collection Time: 12/14/15  7:30 AM  Result  Value Ref Range   Order Confirmation ORDER PROCESSED BY BLOOD BANK   Results for orders placed or performed during the hospital encounter of 12/13/15 (from the past 336 hour(s))  CBC   Collection Time: 12/13/15 11:55 AM  Result Value Ref Range   WBC 9.0 4.0 - 10.5 K/uL   RBC 3.82 (L) 3.87 - 5.11 MIL/uL   Hemoglobin 9.8 (L) 12.0 - 15.0 g/dL   HCT 30.2 (L) 36.0 - 46.0 %   MCV 79.1 78.0 - 100.0 fL   MCH 25.7 (L) 26.0 - 34.0 pg   MCHC 32.5 30.0 - 36.0 g/dL   RDW 16.3 (H) 11.5 - 15.5 %   Platelets 242 150 - 400 K/uL  RPR   Collection Time: 12/13/15 11:55 AM  Result Value Ref Range   RPR Ser Ql Non Reactive Non Reactive  Basic metabolic panel   Collection Time: 12/13/15 11:55 AM  Result Value Ref Range   Sodium 136 135 - 145 mmol/L   Potassium 3.8 3.5 - 5.1  mmol/L   Chloride 107 101 - 111 mmol/L   CO2 20 (L) 22 - 32 mmol/L   Glucose, Bld 142 (H) 65 - 99 mg/dL   BUN 8 6 - 20 mg/dL   Creatinine, Ser 0.69 0.44 - 1.00 mg/dL   Calcium 8.9 8.9 - 10.3 mg/dL   GFR calc non Af Amer >60 >60 mL/min   GFR calc Af Amer >60 >60 mL/min   Anion gap 9 5 - 15  Type and screen   Collection Time: 12/13/15 11:55 AM  Result Value Ref Range   ABO/RH(D) O POS    Antibody Screen NEG    Sample Expiration 12/16/2015    Unit Number [id number]    Blood Component Type RBC LR PHER2    Unit division 00    Status of Unit ALLOCATED    Transfusion Status OK TO TRANSFUSE    Crossmatch Result Compatible    Unit Number [id number]    Blood Component Type RBC LR PHER1    Unit division 00    Status of Unit ALLOCATED    Transfusion Status OK TO TRANSFUSE    Crossmatch Result Compatible   Results for orders placed or performed in visit on 12/09/15 (from the past 336 hour(s))  POCT urinalysis dipstick   Collection Time: 12/09/15 12:21 PM  Result Value Ref Range   Color, UA     Clarity, UA     Glucose, UA neg    Bilirubin, UA     Ketones, UA neg    Spec Grav, UA     Blood, UA neg    pH, UA      Protein, UA trace    Urobilinogen, UA     Nitrite, UA neg    Leukocytes, UA Negative Negative  Results for orders placed or performed in visit on 12/06/15 (from the past 336 hour(s))  POCT urinalysis dipstick   Collection Time: 12/06/15  9:08 AM  Result Value Ref Range   Color, UA     Clarity, UA     Glucose, UA neg    Bilirubin, UA     Ketones, UA neg    Spec Grav, UA     Blood, UA neg    pH, UA     Protein, UA trace    Urobilinogen, UA     Nitrite, UA neg    Leukocytes, UA Negative Negative  Results for orders placed or performed in visit on 12/02/15 (from the past 336 hour(s))  POCT Urinalysis Dipstick   Collection Time: 12/02/15  9:40 AM  Result Value Ref Range   Color, UA     Clarity, UA     Glucose, UA neg    Bilirubin, UA     Ketones, UA neg    Spec Grav, UA     Blood, UA neg    pH, UA     Protein, UA neg    Urobilinogen, UA     Nitrite, UA neg    Leukocytes, UA Trace (A) Negative    EKG: No orders found for this or any previous visit.  Imaging Studies: US Ob Follow Up  12/09/2015  FOLLOW UP SONOGRAM SHERRIAN KINLEY is in the office for a follow up sonogram for EFW,BPP and cord doppler. She is a 30 y.o. year old [id number] with Estimated Date of Delivery: 12/20/15 by LMP now at  [redacted]w[redacted]d weeks gestation. Thus far the pregnancy has been complicated by chtn,gestational diabetes,obesity.. GESTATION: SINGLETON PRESENTATION: cephalic FETAL ACTIVITY:  Heart rate         144 bpm          The fetus is active. AMNIOTIC FLUID: The amniotic fluid volume is  normal, 21 cm. PLACENTA LOCALIZATION:  posterior GRADE 3 CERVIX: Limited view GESTATIONAL AGE AND  BIOMETRICS: Gestational criteria: Estimated Date of Delivery: 12/20/15 by LMP now at [redacted]w[redacted]d Previous Scans:11 BIOPHYSCIAL PROFILE:                                                                                                      COMMENTS GROSS BODY MOVEMENT                 2  TONE                2  RESPIRATIONS                2   AMNIOTIC FLUID                2                                                          SCORE:  8/8 (Note: NST was not performed as part of this antepartum testing) DOPPLER FLOW STUDIES: UMBILICAL ARTERY RI RATIOS:   0.43, 0.44          BIPARIETAL DIAMETER           9.86 cm         40+3 weeks  >97% HEAD CIRCUMFERENCE           36.54 cm         OOR weeks >97% ABDOMINAL CIRCUMFERENCE           35.87 cm         39+5 weeks  94% FEMUR LENGTH           7.79 cm         39+6 weeks   85%                                                       AVERAGE EGA(BY THIS SCAN):  40 weeks                                                 ESTIMATED FETAL WEIGHT:       3893  grams, 89 % ANATOMICAL SURVEY  COMMENTS CEREBRAL VENTRICLES yes normal  CHOROID PLEXUS yes normal  CEREBELLUM    CISTERNA MAGNA    NUCHAL REGION    ORBITS    NASAL BONE    NOSE/LIP    FACIAL PROFILE yes normal  4 CHAMBERED HEART    OUTFLOW TRACTS    DIAPHRAGM yes normal  STOMACH yes normal  RENAL REGION yes normal  BLADDER yes normal  CORD INSERTION    3 VESSEL CORD    SPINE    ARMS/HANDS    LEGS/FEET    GENITALIA   female     SUSPECTED ABNORMALITIES:  yes /BPD/HC >97%,AC 94% QUALITY OF SCAN: satisfactory TECHNICIAN COMMENTS: [place] 38+3 wks,fhr 123456 bpm,cephalic,BPP [id number] .[id number] 21 cm,post pl gr 3,EFW 3893 89%, poorly visualized AC,limited AC measurement A copy of this report including all images has been saved and backed up to a second source for retrieval if needed. All measures and details of the anatomical scan, placentation, fluid volume and pelvic anatomy are contained in that report. Amber Heide Guile 12/09/2015 12:27 PM Clinical Impression and recommendations: I have reviewed the sonogram results above, combined with the patient's current clinical course, below are my impressions and any appropriate recommendations for management based on the sonographic findings. 1.  [id number] Estimated Date of  Delivery: 12/20/15 by serial sonographic evaluations 2.  Fetal sonographic surveillance findings: a). Normal fluid volume b). Normal antepartum fetal assessment with BPP 8/8 c). Normal fetal Doppler ratios with consistent diastolic flow d). Normal growth percentile with appropriate interval growth, borderline macrosomia 3.  Normal general sonographic findings Recommend continued prenatal evaluations and care based on this sonogram and as clinically indicated from the patient's clinical course. Florian Buff 12/09/2015 12:31 PM   US Ob Follow Up  11/25/2015  FOLLOW UP SONOGRAM RAGINI MEHLHAFF is in the office for a follow up sonogram for EFW,BPP and cord doppler. She is a 30 y.o. year old [id number] with Estimated Date of Delivery: 12/20/15 by LMP now at  [redacted]w[redacted]d weeks gestation. Thus far the pregnancy has been complicated by gestational diabetes,obesity,chtn.. GESTATION: SINGLETON PRESENTATION: cephalic FETAL ACTIVITY:          Heart rate         138          The fetus is active. AMNIOTIC FLUID: The amniotic fluid volume is  normal, 15.8 cm. PLACENTA LOCALIZATION:  posterior GRADE 3 CERVIX: Limited view GESTATIONAL AGE AND  BIOMETRICS: Gestational criteria: Estimated Date of Delivery: 12/20/15 by LMP now at [redacted]w[redacted]d Previous Scans:9          BIPARIETAL DIAMETER           9.29 cm         37+6 weeks 91.6% HEAD CIRCUMFERENCE           34 cm         39+1 weeks  83% ABDOMINAL CIRCUMFERENCE           36.47 cm         40+3 weeks   97.7% FEMUR LENGTH           8.27 cm         OOR weeks   97.7%  AVERAGE EGA(BY THIS SCAN):  39+1 weeks                                                 ESTIMATED FETAL WEIGHT:       4229  grams, >90 % DOPPLER FLOW STUDIES: UMBILICAL ARTERY RI RATIOS:   0.48, 0.54 BIOPHYSCIAL PROFILE:                                                                                                      COMMENTS GROSS BODY MOVEMENT                 2  TONE                2   RESPIRATIONS                2  AMNIOTIC FLUID                2                                                          SCORE:  8/8 (Note: NST was not performed as part of this antepartum testing) . ANATOMICAL SURVEY                                                                            COMMENTS CEREBRAL VENTRICLES yes normal  CHOROID PLEXUS yes normal  CEREBELLUM    CISTERNA MAGNA    NUCHAL REGION    ORBITS    NASAL BONE    NOSE/LIP    FACIAL PROFILE    4 CHAMBERED HEART    OUTFLOW TRACTS    DIAPHRAGM yes normal  STOMACH yes normal  RENAL REGION yes normal  BLADDER yes normal  CORD INSERTION    3 VESSEL CORD    SPINE    ARMS/HANDS    LEGS/FEET    GENITALIA   female     SUSPECTED ABNORMALITIES:  yes, EFW  >90% QUALITY OF SCAN: satisfactory TECHNICIAN COMMENTS: [place] [id number] wks,cephalic, post pl gr 3,afi 15.8cm,RI .48,.54,FHR 138 bpm,efw 4229 g >90%,BPP 8/8 A copy of this report including all images has been saved and backed up to a second source for retrieval if needed. All measures and details of the anatomical scan, placentation, fluid volume and pelvic anatomy are contained in that report. Silver Huguenin 11/25/2015 12:39 PM   US Fetal Bpp W/o Non Stress  12/09/2015  FOLLOW UP SONOGRAM  Carolynne Edouard is in the office for a follow up sonogram for EFW,BPP and cord doppler. She is a 30 y.o. year old [id number] with Estimated Date of Delivery: 12/20/15 by LMP now at  [redacted]w[redacted]d weeks gestation. Thus far the pregnancy has been complicated by chtn,gestational diabetes,obesity.. GESTATION: SINGLETON PRESENTATION: cephalic FETAL ACTIVITY:          Heart rate         144 bpm          The fetus is active. AMNIOTIC FLUID: The amniotic fluid volume is  normal, 21 cm. PLACENTA LOCALIZATION:  posterior GRADE 3 CERVIX: Limited view GESTATIONAL AGE AND  BIOMETRICS: Gestational criteria: Estimated Date of Delivery: 12/20/15 by LMP now at [redacted]w[redacted]d Previous Scans:11 BIOPHYSCIAL PROFILE:                                                                                                       COMMENTS GROSS BODY MOVEMENT                 2  TONE                2  RESPIRATIONS                2  AMNIOTIC FLUID                2                                                          SCORE:  8/8 (Note: NST was not performed as part of this antepartum testing) DOPPLER FLOW STUDIES: UMBILICAL ARTERY RI RATIOS:   0.43, 0.44          BIPARIETAL DIAMETER           9.86 cm         40+3 weeks  >97% HEAD CIRCUMFERENCE           36.54 cm         OOR weeks >97% ABDOMINAL CIRCUMFERENCE           35.87 cm         39+5 weeks  94% FEMUR LENGTH           7.79 cm         39+6 weeks   85%                                                       AVERAGE EGA(BY THIS SCAN):  40 weeks  ESTIMATED FETAL WEIGHT:       3893  grams, 89 % ANATOMICAL SURVEY                                                                            COMMENTS CEREBRAL VENTRICLES yes normal  CHOROID PLEXUS yes normal  CEREBELLUM    CISTERNA MAGNA    NUCHAL REGION    ORBITS    NASAL BONE    NOSE/LIP    FACIAL PROFILE yes normal  4 CHAMBERED HEART    OUTFLOW TRACTS    DIAPHRAGM yes normal  STOMACH yes normal  RENAL REGION yes normal  BLADDER yes normal  CORD INSERTION    3 VESSEL CORD    SPINE    ARMS/HANDS    LEGS/FEET    GENITALIA   female     SUSPECTED ABNORMALITIES:  yes /BPD/HC >97%,AC 94% QUALITY OF SCAN: satisfactory TECHNICIAN COMMENTS: [place] 38+3 wks,fhr 123456 bpm,cephalic,BPP [id number] .[id number] 21 cm,post pl gr 3,EFW 3893 89%, poorly visualized AC,limited AC measurement A copy of this report including all images has been saved and backed up to a second source for retrieval if needed. All measures and details of the anatomical scan, placentation, fluid volume and pelvic anatomy are contained in that report. Amber Heide Guile 12/09/2015 12:27 PM Clinical Impression and recommendations: I have reviewed the sonogram results above, combined with the patient's current clinical course,  below are my impressions and any appropriate recommendations for management based on the sonographic findings. 1.  [id number] Estimated Date of Delivery: 12/20/15 by serial sonographic evaluations 2.  Fetal sonographic surveillance findings: a). Normal fluid volume b). Normal antepartum fetal assessment with BPP 8/8 c). Normal fetal Doppler ratios with consistent diastolic flow d). Normal growth percentile with appropriate interval growth, borderline macrosomia 3.  Normal general sonographic findings Recommend continued prenatal evaluations and care based on this sonogram and as clinically indicated from the patient's clinical course. Florian Buff 12/09/2015 12:31 PM   US Fetal Bpp W/o Non Stress  12/02/2015  FOLLOW UP SONOGRAM YEILA LICANO is in the office for a follow up sonogram for BPP and cord doppler. She is a 30 y.o. year old G6P2012 with Estimated Date of Delivery: 12/20/15 by LMP now at  [redacted]w[redacted]d weeks gestation. Thus far the pregnancy has been complicated by gestational diabetes,chtn,obesity.. GESTATION: SINGLETON PRESENTATION: cephalic FETAL ACTIVITY:          Heart rate         147          The fetus is active. AMNIOTIC FLUID: The amniotic fluid volume is  normal, 15.8 cm. PLACENTA LOCALIZATION:  posterior GRADE 3 CERVIX: Limited view ADNEXA: wnl GESTATIONAL AGE AND  BIOMETRICS: Gestational criteria: Estimated Date of Delivery: 12/20/15 by LMP now at [redacted]w[redacted]d Previous Scans:10 ANATOMICAL SURVEY  COMMENTS CEREBRAL VENTRICLES    CHOROID PLEXUS    CEREBELLUM    CISTERNA MAGNA    NUCHAL REGION    ORBITS    NASAL BONE    NOSE/LIP    FACIAL PROFILE    4 CHAMBERED HEART    OUTFLOW TRACTS    DIAPHRAGM yes normal  STOMACH yes normal  RENAL REGION yes normal  BLADDER yes normal  CORD INSERTION    3 VESSEL CORD    SPINE    ARMS/HANDS    LEGS/FEET    GENITALIA yes normal female     SUSPECTED ABNORMALITIES:  no QUALITY OF SCAN: satisfactory TECHNICIAN  COMMENTS: [place] [id number] wks,cephalic,post pl gr 3,bilat adnexa's wnl,BPP 8/8, FHR 147 bpm,AFI 15.8 cm,RI .53,.54 A copy of this report including all images has been saved and backed up to a second source for retrieval if needed. All measures and details of the anatomical scan, placentation, fluid volume and pelvic anatomy are contained in that report. Amber Heide Guile 12/02/2015 9:21 AM   US Fetal Bpp W/o Non Stress  11/25/2015  FOLLOW UP SONOGRAM [name] is in the office for a follow up sonogram for EFW,BPP and cord doppler. She is a 30 y.o. year old G22P2012 with Estimated Date of Delivery: 12/20/15 by LMP now at  [redacted]w[redacted]d weeks gestation. Thus far the pregnancy has been complicated by gestational diabetes,obesity,chtn.. GESTATION: SINGLETON PRESENTATION: cephalic FETAL ACTIVITY:          Heart rate         138          The fetus is active. AMNIOTIC FLUID: The amniotic fluid volume is  normal, 15.8 cm. PLACENTA LOCALIZATION:  posterior GRADE 3 CERVIX: Limited view GESTATIONAL AGE AND  BIOMETRICS: Gestational criteria: Estimated Date of Delivery: 12/20/15 by LMP now at [redacted]w[redacted]d Previous Scans:9          BIPARIETAL DIAMETER           9.29 cm         37+6 weeks 91.6% HEAD CIRCUMFERENCE           34 cm         39+1 weeks  83% ABDOMINAL CIRCUMFERENCE           36.47 cm         40+3 weeks   97.7% FEMUR LENGTH           8.27 cm         OOR weeks   97.7%                                                       AVERAGE EGA(BY THIS SCAN):  39+1 weeks                                                 ESTIMATED FETAL WEIGHT:       4229  grams, >90 % DOPPLER FLOW STUDIES: UMBILICAL ARTERY RI RATIOS:   0.48, 0.54 BIOPHYSCIAL PROFILE:  COMMENTS GROSS BODY MOVEMENT                 2  TONE                2  RESPIRATIONS                2  AMNIOTIC FLUID                2                                                          SCORE:  8/8 (Note: NST  was not performed as part of this antepartum testing) . ANATOMICAL SURVEY                                                                            COMMENTS CEREBRAL VENTRICLES yes normal  CHOROID PLEXUS yes normal  CEREBELLUM    CISTERNA MAGNA    NUCHAL REGION    ORBITS    NASAL BONE    NOSE/LIP    FACIAL PROFILE    4 CHAMBERED HEART    OUTFLOW TRACTS    DIAPHRAGM yes normal  STOMACH yes normal  RENAL REGION yes normal  BLADDER yes normal  CORD INSERTION    3 VESSEL CORD    SPINE    ARMS/HANDS    LEGS/FEET    GENITALIA   female     SUSPECTED ABNORMALITIES:  yes, EFW  >90% QUALITY OF SCAN: satisfactory TECHNICIAN COMMENTS: [place] [id number] wks,cephalic, post pl gr 3,afi 15.8cm,RI .48,.54,FHR 138 bpm,efw 4229 g >90%,BPP 8/8 A copy of this report including all images has been saved and backed up to a second source for retrieval if needed. All measures and details of the anatomical scan, placentation, fluid volume and pelvic anatomy are contained in that report. Silver Huguenin 11/25/2015 12:39 PM   US Fetal Bpp W/o Non Stress  11/18/2015  FOLLOW UP SONOGRAM JOCELYNNE SHAMBACH is in the office for a follow up sonogram for BPP and EFW. She is a 30 y.o. year old G96P2012 with Estimated Date of Delivery: 12/20/15 by LMP now at  [redacted]w[redacted]d weeks gestation. Thus far the pregnancy has been complicated by HTN,gestational diabetes.. GESTATION: SINGLETON PRESENTATION: cephalic FETAL ACTIVITY:          Heart rate         155 bpm          The fetus is active. AMNIOTIC FLUID: The amniotic fluid volume is  normal, 17.6 cm. PLACENTA LOCALIZATION:  posterior GRADE 2 CERVIX: Limited view ADNEXA: The ovaries are normal. GESTATIONAL AGE AND  BIOMETRICS: Gestational criteria: Estimated Date of Delivery: 12/20/15 by LMP now at [redacted]w[redacted]d Previous Scans:8 BIOPHYSCIAL PROFILE:  COMMENTS GROSS BODY MOVEMENT                 2  TONE                2  RESPIRATIONS                 2  AMNIOTIC FLUID                2                                                          SCORE:  8/8 (Note: NST was not performed as part of this antepartum testing) DOPPLER FLOW STUDIES: UMBILICAL ARTERY RI RATIOS:   0.57, 0.53 ANATOMICAL SURVEY                                                                            COMMENTS CEREBRAL VENTRICLES    CHOROID PLEXUS    CEREBELLUM    CISTERNA MAGNA    NUCHAL REGION    ORBITS    NASAL BONE    NOSE/LIP    FACIAL PROFILE    4 CHAMBERED HEART yes normal  OUTFLOW TRACTS    DIAPHRAGM    STOMACH yes normal  RENAL REGION yes normal  BLADDER yes normal  CORD INSERTION    3 VESSEL CORD yes normal  SPINE    ARMS/HANDS    LEGS/FEET    GENITALIA yes normal female     SUSPECTED ABNORMALITIES:  no QUALITY OF SCAN: satisfactory TECHNICIAN COMMENTS: [place] [id number] wks,cephalic,post pl 2,normal ov's bilat,BPP 8/8,fhr 155 bpm,afi 17.6cm,RI .57,.53 A copy of this report including all images has been saved and backed up to a second source for retrieval if needed. All measures and details of the anatomical scan, placentation, fluid volume and pelvic anatomy are contained in that report. Amber Heide Guile 11/18/2015 11:32 AM Clinical Impression and recommendations: I have reviewed the sonogram results above, combined with the patient's current clinical course, below are my impressions and any appropriate recommendations for management based on the sonographic findings. 1.  [id number] Estimated Date of Delivery: 12/20/15 by serial sonographic evaluations 2.  Fetal sonographic surveillance findings: a). Normal fluid volume b). Normal antepartum fetal assessment with BPP 8/8 c). Normal fetal Doppler ratios with consistent diastolic flow 3.  Normal general sonographic findings Recommend continued prenatal evaluations and care based on this sonogram and as clinically indicated from the patient's clinical course. Aahna Rossa H 11/18/2015 11:52 AM   [place] Ua Cord Doppler  12/09/2015  FOLLOW UP SONOGRAM [name] is in the office for a follow up sonogram for EFW,BPP and cord doppler. She is a 30 y.o. year old G8P2012 with Estimated Date of Delivery: 12/20/15 by LMP now at  [redacted]w[redacted]d weeks gestation. Thus far the pregnancy has been complicated by chtn,gestational diabetes,obesity.. GESTATION: SINGLETON PRESENTATION: cephalic FETAL ACTIVITY:          Heart rate         144 bpm  The fetus is active. AMNIOTIC FLUID: The amniotic fluid volume is  normal, 21 cm. PLACENTA LOCALIZATION:  posterior GRADE 3 CERVIX: Limited view GESTATIONAL AGE AND  BIOMETRICS: Gestational criteria: Estimated Date of Delivery: 12/20/15 by LMP now at [redacted]w[redacted]d Previous Scans:11 BIOPHYSCIAL PROFILE:                                                                                                      COMMENTS GROSS BODY MOVEMENT                 2  TONE                2  RESPIRATIONS                2  AMNIOTIC FLUID                2                                                          SCORE:  8/8 (Note: NST was not performed as part of this antepartum testing) DOPPLER FLOW STUDIES: UMBILICAL ARTERY RI RATIOS:   0.43, 0.44          BIPARIETAL DIAMETER           9.86 cm         40+3 weeks  >97% HEAD CIRCUMFERENCE           36.54 cm         OOR weeks >97% ABDOMINAL CIRCUMFERENCE           35.87 cm         39+5 weeks  94% FEMUR LENGTH           7.79 cm         39+6 weeks   85%                                                       AVERAGE EGA(BY THIS SCAN):  40 weeks                                                 ESTIMATED FETAL WEIGHT:       3893  grams, 89 % ANATOMICAL SURVEY  COMMENTS CEREBRAL VENTRICLES yes normal  CHOROID PLEXUS yes normal  CEREBELLUM    CISTERNA MAGNA    NUCHAL REGION    ORBITS    NASAL BONE    NOSE/LIP    FACIAL PROFILE yes normal  4 CHAMBERED HEART    OUTFLOW TRACTS    DIAPHRAGM yes normal  STOMACH yes normal  RENAL REGION yes normal  BLADDER yes normal   CORD INSERTION    3 VESSEL CORD    SPINE    ARMS/HANDS    LEGS/FEET    GENITALIA   female     SUSPECTED ABNORMALITIES:  yes /BPD/HC >97%,AC 94% QUALITY OF SCAN: satisfactory TECHNICIAN COMMENTS: [place] 38+3 wks,fhr 123456 bpm,cephalic,BPP [id number] .[id number] 21 cm,post pl gr 3,EFW 3893 89%, poorly visualized AC,limited AC measurement A copy of this report including all images has been saved and backed up to a second source for retrieval if needed. All measures and details of the anatomical scan, placentation, fluid volume and pelvic anatomy are contained in that report. Amber Heide Guile 12/09/2015 12:27 PM Clinical Impression and recommendations: I have reviewed the sonogram results above, combined with the patient's current clinical course, below are my impressions and any appropriate recommendations for management based on the sonographic findings. 1.  [id number] Estimated Date of Delivery: 12/20/15 by serial sonographic evaluations 2.  Fetal sonographic surveillance findings: a). Normal fluid volume b). Normal antepartum fetal assessment with BPP 8/8 c). Normal fetal Doppler ratios with consistent diastolic flow d). Normal growth percentile with appropriate interval growth, borderline macrosomia 3.  Normal general sonographic findings Recommend continued prenatal evaluations and care based on this sonogram and as clinically indicated from the patient's clinical course. Florian Buff 12/09/2015 12:31 PM   [place] U[facility] Cord Doppler  12/02/2015  FOLLOW UP SONOGRAM DARRIE BETSCH is in the office for a follow up sonogram for BPP and cord doppler. She is a 30 y.o. year old G50P2012 with Estimated Date of Delivery: 12/20/15 by LMP now at  [redacted]w[redacted]d weeks gestation. Thus far the pregnancy has been complicated by gestational diabetes,chtn,obesity.. GESTATION: SINGLETON PRESENTATION: cephalic FETAL ACTIVITY:          Heart rate         147          The fetus is active. AMNIOTIC FLUID: The amniotic fluid volume is  normal, 15.8 cm. PLACENTA  LOCALIZATION:  posterior GRADE 3 CERVIX: Limited view ADNEXA: wnl GESTATIONAL AGE AND  BIOMETRICS: Gestational criteria: Estimated Date of Delivery: 12/20/15 by LMP now at [redacted]w[redacted]d Previous Scans:10 [place]    ORBITS    NASAL BONE    NOSE/LIP    FACIAL PROFILE    4 CHAMBERED HEART    OUTFLOW TRACTS    DIAPHRAGM yes normal  STOMACH yes normal  RENAL REGION yes normal  BLADDER yes normal  CORD INSERTION    3 VESSEL CORD    SPINE    ARMS/HANDS  LEGS/FEET    GENITALIA yes normal female     SUSPECTED ABNORMALITIES:  no QUALITY OF SCAN: satisfactory TECHNICIAN COMMENTS: [place] [id number] wks,cephalic,post pl gr 3,bilat adnexa's wnl,BPP 8/8, FHR 147 bpm,AFI 15.8 cm,RI .53,.54 A copy of this report including all images has been saved and backed up to a second source for retrieval if needed. All measures and details of the anatomical scan, placentation, fluid volume and pelvic anatomy are contained in that report. Silver Huguenin 12/02/2015 9:21 AM   [place] U[facility] Cord Doppler  11/25/2015  FOLLOW UP SONOGRAM [name] is in the office for a follow up sonogram for EFW,BPP and cord doppler. She is a 30 y.o. year old G20P2012 with Estimated Date of Delivery: 12/20/15 by LMP now at  [redacted]w[redacted]d weeks gestation. Thus far the pregnancy has been complicated by gestational diabetes,obesity,chtn.. GESTATION: SINGLETON PRESENTATION: cephalic FETAL ACTIVITY:          Heart rate         138          The fetus is active. AMNIOTIC FLUID: The amniotic fluid volume is  normal, 15.8 cm. PLACENTA LOCALIZATION:  posterior GRADE 3 CERVIX: Limited view GESTATIONAL AGE AND  BIOMETRICS: Gestational criteria: Estimated Date of Delivery: 12/20/15 by LMP now at [redacted]w[redacted]d Previous Scans:9          BIPARIETAL DIAMETER           9.29 cm         37+6 weeks 91.6% HEAD CIRCUMFERENCE           34 cm          39+1 weeks  83% ABDOMINAL CIRCUMFERENCE           36.47 cm         40+3 weeks   97.7% FEMUR LENGTH           8.27 cm         OOR weeks   97.7%                                                       AVERAGE EGA(BY THIS SCAN):  39+1 weeks                                                 ESTIMATED FETAL WEIGHT:       4229  grams, >90 % DOPPLER FLOW STUDIES: UMBILICAL ARTERY RI RATIOS:   0.48, 0.54 BIOPHYSCIAL PROFILE:                                                                                                      COMMENTS GROSS BODY MOVEMENT                 2  TONE  2  RESPIRATIONS                2  AMNIOTIC FLUID                2                                                          SCORE:  8/8 (Note: NST was not performed as part of this antepartum testing) . ANATOMICAL SURVEY                                                                            COMMENTS CEREBRAL VENTRICLES yes normal  CHOROID PLEXUS yes normal  CEREBELLUM    CISTERNA MAGNA    NUCHAL REGION    ORBITS    NASAL BONE    NOSE/LIP    FACIAL PROFILE    4 CHAMBERED HEART    OUTFLOW TRACTS    DIAPHRAGM yes normal  STOMACH yes normal  RENAL REGION yes normal  BLADDER yes normal  CORD INSERTION    3 VESSEL CORD    SPINE    ARMS/HANDS    LEGS/FEET    GENITALIA   female     SUSPECTED ABNORMALITIES:  yes, EFW  >90% QUALITY OF SCAN: satisfactory TECHNICIAN COMMENTS: [place] [id number] wks,cephalic, post pl gr 3,afi 15.8cm,RI .48,.54,FHR 138 bpm,efw 4229 g >90%,BPP 8/8 A copy of this report including all images has been saved and backed up to a second source for retrieval if needed. All measures and details of the anatomical scan, placentation, fluid volume and pelvic anatomy are contained in that report. Silver Huguenin 11/25/2015 12:39 PM   [place] U[facility] Cord Doppler  11/18/2015  FOLLOW UP SONOGRAM [name] is in the office for a follow up sonogram for BPP and EFW. She is a 30 y.o. year old G67P2012 with Estimated Date of Delivery: 12/20/15 by LMP now at   [redacted]w[redacted]d weeks gestation. Thus far the pregnancy has been complicated by HTN,gestational diabetes.. GESTATION: SINGLETON PRESENTATION: cephalic FETAL ACTIVITY:          Heart rate         155 bpm          The fetus is active. AMNIOTIC FLUID: The amniotic fluid volume is  normal, 17.6 cm. PLACENTA LOCALIZATION:  posterior GRADE 2 CERVIX: Limited view ADNEXA: The ovaries are normal. GESTATIONAL AGE AND  BIOMETRICS: Gestational criteria: Estimated Date of Delivery: 12/20/15 by LMP now at [redacted]w[redacted]d Previous Scans:8 BIOPHYSCIAL PROFILE:  COMMENTS GROSS BODY MOVEMENT                 2  TONE                2  RESPIRATIONS                2  AMNIOTIC FLUID                2                                                          SCORE:  8/8 (Note: NST was not performed as part of this antepartum testing) DOPPLER FLOW STUDIES: UMBILICAL ARTERY RI RATIOS:   0.57, 0.53 ANATOMICAL SURVEY                                                                            COMMENTS CEREBRAL VENTRICLES    CHOROID PLEXUS    CEREBELLUM    CISTERNA MAGNA    NUCHAL REGION    ORBITS    NASAL BONE    NOSE/LIP    FACIAL PROFILE    4 CHAMBERED HEART yes normal  OUTFLOW TRACTS    DIAPHRAGM    STOMACH yes normal  RENAL REGION yes normal  BLADDER yes normal  CORD INSERTION    3 VESSEL CORD yes normal  SPINE    ARMS/HANDS    LEGS/FEET    GENITALIA yes normal female     SUSPECTED ABNORMALITIES:  no QUALITY OF SCAN: satisfactory TECHNICIAN COMMENTS: [place] [id number] wks,cephalic,post pl 2,normal ov's bilat,BPP 8/8,fhr 155 bpm,afi 17.6cm,RI .57,.53 A copy of this report including all images has been saved and backed up to a second source for retrieval if needed. All measures and details of the anatomical scan, placentation, fluid volume and pelvic anatomy are contained in that report. Amber Heide Guile 11/18/2015 11:32 AM Clinical Impression and recommendations: I have reviewed the  sonogram results above, combined with the patient's current clinical course, below are my impressions and any appropriate recommendations for management based on the sonographic findings. 1.  [id number] Estimated Date of Delivery: 12/20/15 by serial sonographic evaluations 2.  Fetal sonographic surveillance findings: a). Normal fluid volume b). Normal antepartum fetal assessment with BPP 8/8 c). Normal fetal Doppler ratios with consistent diastolic flow 3.  Normal general sonographic findings Recommend continued prenatal evaluations and care based on this sonogram and as clinically indicated from the patient's clinical course. Florian Buff 11/18/2015 11:52 AM      Assessment: [redacted]w[redacted]d Estimated Date of Delivery: 12/20/15 Previous Caesarean section  Desires sterilization Suspected fetal macrosomia Chronic hypertension Declines VBAC trial this time Patient Active Problem List   Diagnosis Date Noted  . Desires VBAC (vaginal birth after cesarean) trial 10/27/2015  . 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 Gestational diabetes mellitus, class A1/B 06/03/2015  . History of gestational diabetes in prior pregnancy, currently pregnant in first trimester 05/26/2015  . History of VBAC 05/26/2015  . Supervision of other high-risk pregnancy 05/26/2015  .  Chronic hypertension in pregnancy   . Obesity affecting pregnancy, antepartum   . Hx of preeclampsia, prior pregnancy, currently pregnant 01/20/2014  . Morbid obesity ([place]) 02/13/2013    Plan: Repeat C section with BTL  [name] H 12/14/2015 7:11 AM

## 2015-12-14 NOTE — Anesthesia Procedure Notes (Signed)
Spinal Patient location during procedure: OR Staffing Anesthesiologist: Lynzie Cliburn Performed by: anesthesiologist  Preanesthetic Checklist Completed: patient identified, site marked, surgical consent, pre-op evaluation, timeout performed, IV checked, risks and benefits discussed and monitors and equipment checked Spinal Block Patient position: sitting Prep: DuraPrep Patient monitoring: continuous pulse ox, blood pressure and heart rate Approach: midline Injection technique: single-shot Needle Needle type: Pencan  Needle gauge: 24 G Needle length: 9 cm Additional Notes Functioning IV was confirmed and monitors were applied. Sterile prep and drape, including hand hygiene, mask and sterile gloves were used. The patient was positioned and the spine was prepped. The skin was anesthetized with lidocaine.  Free flow of clear CSF was obtained prior to injecting local anesthetic into the CSF.  The spinal needle aspirated freely following injection.  The needle was carefully withdrawn.  The patient tolerated the procedure well. Consent was obtained prior to procedure with all questions answered and concerns addressed. Risks including but not limited to bleeding, infection, nerve damage, paralysis, failed block, inadequate analgesia, allergic reaction, high spinal, itching and headache were discussed and the patient wished to proceed.   Lauretta Grill, MD

## 2015-12-14 NOTE — Anesthesia Preprocedure Evaluation (Signed)
Anesthesia Evaluation  Patient identified by MRN, date of birth, ID band Patient awake    Reviewed: Allergy & Precautions, NPO status , Patient's Chart, lab work & pertinent test results  Airway Mallampati: II  TM Distance: >3 FB Neck ROM: Full    Dental no notable dental hx. (+) Dental Advisory Given   Pulmonary neg pulmonary ROS,    Pulmonary exam normal breath sounds clear to auscultation       Cardiovascular hypertension, Pt. on medications and Pt. on home beta blockers Normal cardiovascular exam Rhythm:Regular Rate:Normal     Neuro/Psych negative neurological ROS  negative psych ROS   GI/Hepatic negative GI ROS, Neg liver ROS,   Endo/Other  diabetes, GestationalMorbid obesity  Renal/GU negative Renal ROS  negative genitourinary   Musculoskeletal negative musculoskeletal ROS (+)   Abdominal   Peds negative pediatric ROS (+)  Hematology negative hematology ROS (+)   Anesthesia Other Findings   Reproductive/Obstetrics (+) Pregnancy                             Anesthesia Physical Anesthesia Plan  ASA: III  Anesthesia Plan: Spinal and Combined Spinal and Epidural   Post-op Pain Management:    Induction:   Airway Management Planned:   Additional Equipment:   Intra-op Plan:   Post-operative Plan:   Informed Consent: I have reviewed the patients History and Physical, chart, labs and discussed the procedure including the risks, benefits and alternatives for the proposed anesthesia with the patient or authorized representative who has indicated his/her understanding and acceptance.   Dental advisory given  Plan Discussed with: CRNA  Anesthesia Plan Comments:         Anesthesia Quick Evaluation

## 2015-12-14 NOTE — Op Note (Signed)
Preoperative diagnosis:  1.  Intrauterine pregnancy at [redacted]w[redacted]d  weeks gestation                                         2.  Class A1 DM                                         3.  Chronic Hypertension                                         4.  Suspected fetal macrosomia                                         5.  Desires permanent sterilization   Postoperative diagnosis:  Same as above plus funic presentation of the umbilical cord  Procedure:  Repeat Ceasarean section with bilateral tubal ligation, fimbriectomy  Surgeon:  Florian Buff MD  Assistant:    Anesthesia: Spinal  Findings:  .    Over a low transverse incision was delivered a viable female at 17 with Apgars of 8 and 9 weighing pending lbs.  oz. Uterus, tubes and ovaries were all normal.  Interestingly the umbilical cord was actually in front of the head which would have made a vaginal trial impossible anyway.  There were no other significant findings  Description of operation:  Patient was taken to the operating room and placed in the sitting position where she underwent a spinal anesthetic. She was then placed in the supine position with tilt to the left side. When adequate anesthetic level was obtained she was prepped and draped in usual sterile fashion and a Foley catheter was placed. A Pfannenstiel skin incision was made and carried down sharply to the rectus fascia which was scored in the midline extended laterally. The fascia was taken off the muscles both superiorly and without difficulty. The muscles were divided.  The peritoneal cavity was entered.  Bladder blade was placed, no bladder flap was created.  A low transverse hysterotomy incision was made and delivered a viable female  infant at 61 with Apgars of 8 and 9 weighingpending lbs  oz.  Cord pH was obtained and was pending. The uterus was exteriorized. It was closed in 2 layers, the first being a running interlocking layer and the second being an imbricating layer using 0  monocryl on a CTX needle. There was good resulting hemostasis.   A bilateral fimbiectomy was performed using plain gut, the lateral third of the tube was removed and a fore and aft ligation performed.  This would be a modified Uchidae, that is to include the fimbriectomy.The uterus tubes and ovaries were all normal. Peritoneal cavity was irrigated vigorously. The muscles and peritoneum were reapproximated loosely. The fascia was closed using 0 Vicryl in running fashion. Subcutaneous tissue was made hemostatic and irrigated. The skin was closed using 4-0 Vicryl on a Keith needle in a subcuticular fashion.  Liquiban was placed for additional wound integrity and to serve as a barrier. Blood loss for the procedure was 500 cc. The patient received a gram of Ancef prophylactically.  The patient was taken to the recovery room in good stable condition with all counts being correct x3.  EBL 500 cc  Salmaan Patchin H 12/14/2015 8:27 AM

## 2015-12-14 NOTE — Anesthesia Postprocedure Evaluation (Signed)
Anesthesia Post Note  Patient: [name]  Procedure(s) Performed: Procedure(s) (LRB): CESAREAN SECTION (N/A) BILATERAL TUBAL LIGATION (Bilateral)  Patient location during evaluation: PACU Anesthesia Type: Spinal Level of consciousness: awake and alert Pain management: pain level controlled Vital Signs Assessment: post-procedure vital signs reviewed and stable Respiratory status: spontaneous breathing, nonlabored ventilation, respiratory function stable and patient connected to nasal cannula oxygen Cardiovascular status: blood pressure returned to baseline and stable Postop Assessment: no signs of nausea or vomiting, spinal receding and patient able to bend at knees Anesthetic complications: no     Last Vitals:  Filed Vitals:   12/14/15 1205 12/14/15 1305  BP: 123/66 131/79  Pulse: 68 72  Temp: 37 C 37 C  Resp: 20 20    Last Pain:  Filed Vitals:   12/14/15 1530  PainSc: 0-No pain   Pain Goal: Patients Stated Pain Goal: 4 (12/14/15 0950)               Maripaz Mullan JENNETTE

## 2015-12-14 NOTE — Addendum Note (Signed)
Addendum  created 12/14/15 1759 by Flossie Dibble, CRNA   Modules edited: Notes Section   Notes Section:  File: [id number]

## 2015-12-14 NOTE — Anesthesia Postprocedure Evaluation (Signed)
Anesthesia Post Note  Patient: [name]  Procedure(s) Performed: Procedure(s) (LRB): CESAREAN SECTION (N/A) BILATERAL TUBAL LIGATION (Bilateral)  Patient location during evaluation: Mother Baby Anesthesia Type: Spinal Level of consciousness: awake and alert and oriented Pain management: satisfactory to patient Vital Signs Assessment: post-procedure vital signs reviewed and stable Respiratory status: respiratory function stable and spontaneous breathing Cardiovascular status: blood pressure returned to baseline Postop Assessment: no headache, no backache, spinal receding, patient able to bend at knees and adequate PO intake Anesthetic complications: no     Last Vitals:  Filed Vitals:   12/14/15 1305 12/14/15 1725  BP: 131/79 126/69  Pulse: 72 84  Temp: 37 C 37.2 C  Resp: 20 20    Last Pain:  Filed Vitals:   12/14/15 1743  PainSc: 0-No pain   Pain Goal: Patients Stated Pain Goal: 4 (12/14/15 0950)               Katherina Mires

## 2015-12-15 LAB — CBC
HEMATOCRIT: 23.6 % — AB (ref 36.0–46.0)
HEMOGLOBIN: 7.6 g/dL — AB (ref 12.0–15.0)
MCH: 25.9 pg — ABNORMAL LOW (ref 26.0–34.0)
MCHC: 32.2 g/dL (ref 30.0–36.0)
MCV: 80.3 fL (ref 78.0–100.0)
Platelets: 185 10*3/uL (ref 150–400)
RBC: 2.94 MIL/uL — AB (ref 3.87–5.11)
RDW: 17 % — ABNORMAL HIGH (ref 11.5–15.5)
WBC: 9.7 10*3/uL (ref 4.0–10.5)

## 2015-12-15 LAB — BIRTH TISSUE RECOVERY COLLECTION (PLACENTA DONATION)

## 2015-12-15 LAB — GLUCOSE, CAPILLARY: GLUCOSE-CAPILLARY: 101 mg/dL — AB (ref 65–99)

## 2015-12-15 MED ORDER — FERROUS SULFATE 325 (65 FE) MG PO TABS
325.0000 mg | ORAL_TABLET | Freq: Two times a day (BID) | ORAL | Status: DC
  Administered 2015-12-15 – 2015-12-16 (×2): 325 mg via ORAL
  Filled 2015-12-15 (×2): qty 1

## 2015-12-15 MED ORDER — SODIUM CHLORIDE 0.9 % IV SOLN
510.0000 mg | Freq: Once | INTRAVENOUS | Status: AC
  Administered 2015-12-15: 510 mg via INTRAVENOUS
  Filled 2015-12-15: qty 17

## 2015-12-15 NOTE — Progress Notes (Signed)
POSTPARTUM PROGRESS NOTE  Post Operative Day 1 Subjective:  [name] is a 30 y.o. [id number] [redacted]w[redacted]d s/p rLTCS + BTL.  No acute events overnight.  Pt denies problems with ambulating, voiding or po intake.  She denies nausea or vomiting.  Pain is well controlled.  She has not had flatus. She has not had bowel movement.  Lochia Small.   Objective: Blood pressure 134/74, pulse 89, temperature 98.8 F (37.1 C), temperature source Oral, resp. rate 24, last menstrual period 03/15/2015, SpO2 97 %, unknown if currently breastfeeding.  Physical Exam:  General: alert, cooperative and no distress Lochia:normal flow Chest: CTAB Heart: RRR no m/r/g Abdomen: +BS, soft, nontender,  Uterine Fundus: firm, below the umbilicus DVT Evaluation: No calf swelling or tenderness Extremities: mild/moderate edema of ankles/feet   Recent Labs  12/13/15 1155 12/15/15 0508  HGB 9.8* 7.6*  HCT 30.2* 23.6*    Assessment/Plan:  ASSESSMENT: [name] is a 30 y.o. [id number] [redacted]w[redacted]d s/p rLTCS + BTL.  Plan for discharge tomorrow   LOS: 1 day   Evette Doffing 12/15/2015, 9:40 AM   CNM attestation Post Partum Day #1 I have seen and examined this patient and agree with above documentation in the resident's note.   [name] is a 30 y.o. [id number] s/p rLTCS & BTL.  Pt denies problems with ambulating, voiding or po intake. Pain is well controlled.  Plan for birth control is bilateral tubal ligation.    PE:  BP 148/79 mmHg  Pulse 90  Temp(Src) 98.2 F (36.8 C) (Oral)  Resp 18  SpO2 97%  LMP 03/15/2015 (Approximate)  Breastfeeding? Unknown Fundus firm  Plan for discharge: 12/16/15  Serita Grammes, CNM 12:13 AM

## 2015-12-16 MED ORDER — LABETALOL HCL 200 MG PO TABS: 400.0000 mg | ORAL_TABLET | Freq: Two times a day (BID) | ORAL | Status: DC

## 2015-12-16 MED ORDER — OXYCODONE-ACETAMINOPHEN 5-325 MG PO TABS: 1.0000 | ORAL_TABLET | Freq: Four times a day (QID) | ORAL | Status: DC | PRN

## 2015-12-16 MED ORDER — IBUPROFEN 600 MG PO TABS: 600.0000 mg | ORAL_TABLET | Freq: Four times a day (QID) | ORAL | Status: DC

## 2015-12-16 MED ORDER — FERROUS SULFATE 325 (65 FE) MG PO TABS: 325.0000 mg | ORAL_TABLET | Freq: Two times a day (BID) | ORAL | Status: DC

## 2015-12-16 MED ORDER — ACETAMINOPHEN 325 MG PO TABS: 650.0000 mg | ORAL_TABLET | ORAL | Status: DC | PRN

## 2015-12-16 MED ORDER — HYDROCHLOROTHIAZIDE 12.5 MG PO TABS: 12.5000 mg | ORAL_TABLET | Freq: Every day | ORAL | Status: DC

## 2015-12-16 NOTE — Progress Notes (Signed)
Called MD about patient BP being elevated in the 140's-150's/ 80's-90's. MD is aware and no new orders at this time were given.

## 2015-12-16 NOTE — Discharge Instructions (Signed)
Hypertension During Pregnancy Hypertension, or high blood pressure, is when there is extra pressure inside your blood vessels that carry blood from the heart to the rest of your body (arteries). It can happen at any time in life, including pregnancy. Hypertension during pregnancy can cause problems for you and your baby. Your baby might not weigh as much as he or she should at birth or might be born early (premature). Very bad cases of hypertension during pregnancy can be life-threatening.  Different types of hypertension can occur during pregnancy. These include:  Chronic hypertension. This happens when a woman has hypertension before pregnancy and it continues during pregnancy.  Gestational hypertension. This is when hypertension develops during pregnancy.  Preeclampsia or toxemia of pregnancy. This is a very serious type of hypertension that develops only during pregnancy. It affects the whole body and can be very dangerous for both mother and baby.  Gestational hypertension and preeclampsia usually go away after your baby is born. Your blood pressure will likely stabilize within 6 weeks. Women who have hypertension during pregnancy have a greater chance of developing hypertension later in life or with future pregnancies. RISK FACTORS There are certain factors that make it more likely for you to develop hypertension during pregnancy. These include:  Having hypertension before pregnancy.  Having hypertension during a previous pregnancy.  Being overweight.  Being older than 40 years.  Being pregnant with more than one baby.  Having diabetes or kidney problems. SIGNS AND SYMPTOMS Chronic and gestational hypertension rarely cause symptoms. Preeclampsia has symptoms, which may include:  Increased protein in your urine. Your health care provider will check for this at every prenatal visit.  Swelling of your hands and face.  Rapid weight gain.  Headaches.  Visual changes.  Being  bothered by light.  Abdominal pain, especially in the upper right area.  Chest pain.  Shortness of breath.  Increased reflexes.  Seizures. These occur with a more severe form of preeclampsia, called eclampsia. DIAGNOSIS  You may be diagnosed with hypertension during a regular prenatal exam. At each prenatal visit, you may have:  Your blood pressure checked.  A urine test to check for protein in your urine. The type of hypertension you are diagnosed with depends on when you developed it. It also depends on your specific blood pressure reading.  Developing hypertension before 20 weeks of pregnancy is consistent with chronic hypertension.  Developing hypertension after 20 weeks of pregnancy is consistent with gestational hypertension.  Hypertension with increased urinary protein is diagnosed as preeclampsia.  Blood pressure measurements that stay above 834 systolic or 196 diastolic are a sign of severe preeclampsia. TREATMENT Treatment for hypertension during pregnancy varies. Treatment depends on the type of hypertension and how serious it is.  If you take medicine for chronic hypertension, you may need to switch medicines.  Medicines called ACE inhibitors should not be taken during pregnancy.  Low-dose aspirin may be suggested for women who have risk factors for preeclampsia.  If you have gestational hypertension, you may need to take a blood pressure medicine that is safe during pregnancy. Your health care provider will recommend the correct medicine.  If you have severe preeclampsia, you may need to be in the hospital. Health care providers will watch you and your baby very closely. You also may need to take medicine called magnesium sulfate to prevent seizures and lower blood pressure.  Sometimes, an early delivery is needed. This may be the case if the condition worsens. It would be  done to protect you and your baby. The only cure for preeclampsia is delivery.  Your health  care provider may recommend that you take one low-dose aspirin (81 mg) each day to help prevent high blood pressure during your pregnancy if you are at risk for preeclampsia. You may be at risk for preeclampsia if:  You had preeclampsia or eclampsia during a previous pregnancy.  Your baby did not grow as expected during a previous pregnancy.  You experienced preterm birth with a previous pregnancy.  You experienced a separation of the placenta from the uterus (placental abruption) during a previous pregnancy.  You experienced the loss of your baby during a previous pregnancy.  You are pregnant with more than one baby.  You have other medical conditions, such as diabetes or an autoimmune disease. HOME CARE INSTRUCTIONS  Schedule and keep all of your regular prenatal care appointments. This is important.  Take medicines only as directed by your health care provider. Tell your health care provider about all medicines you take.  Eat as little salt as possible.  Get regular exercise.  Do not drink alcohol.  Do not use tobacco products.  Do not drink products with caffeine.  Lie on your left side when resting. SEEK IMMEDIATE MEDICAL CARE IF:  You have severe abdominal pain.  You have sudden swelling in your hands, ankles, or face.  You gain 4 pounds (1.8 kg) or more in 1 week.  You vomit repeatedly.  You have vaginal bleeding.  You do not feel your baby moving as much.  You have a headache.  You have blurred or double vision.  You have muscle twitching or spasms.  You have shortness of breath.  You have blue fingernails or lips.  You have blood in your urine. MAKE SURE YOU:  Understand these instructions.  Will watch your condition.  Will get help right away if you are not doing well or get worse.   This information is not intended to replace advice given to you by your health care provider. Make sure you discuss any questions you have with your health care  provider.   Document Released: 04/11/2011 Document Revised: 08/14/2014 Document Reviewed: 02/20/2013 Elsevier Interactive Patient Education 2016 [employer]. Postpartum Care After Cesarean Delivery After you deliver your newborn (postpartum period), the usual stay in the hospital is 24-72 hours. If there were problems with your labor or delivery, or if you have other medical problems, you might be in the hospital longer.  While you are in the hospital, you will receive help and instructions on how to care for yourself and your newborn during the postpartum period.  While you are in the hospital:  It is normal for you to have pain or discomfort from the incision in your abdomen. Be sure to tell your nurses when you are having pain, where the pain is located, and what makes the pain worse.  If you are breastfeeding, you may feel uncomfortable contractions of your uterus for a couple of weeks. This is normal. The contractions help your uterus get back to normal size.  It is normal to have some bleeding after delivery.  For the first 1-3 days after delivery, the flow is red and the amount may be similar to a period.  It is common for the flow to start and stop.  In the first few days, you may pass some small clots. Let your nurses know if you begin to pass large clots or your flow increases.  Do  not  flush blood clots down the toilet before having the nurse look at them.  During the next 3-10 days after delivery, your flow should become more watery and pink or brown-tinged in color.  Ten to fourteen days after delivery, your flow should be a small amount of yellowish-white discharge.  The amount of your flow will decrease over the first few weeks after delivery. Your flow may stop in 6-8 weeks. Most women have had their flow stop by 12 weeks after delivery.  You should change your sanitary pads frequently.  Wash your hands thoroughly with soap and water for at least 20 seconds after  changing pads, using the toilet, or before holding or feeding your newborn.  Your intravenous (IV) tubing will be removed when you are drinking enough fluids.  The urine drainage tube (urinary catheter) that was inserted before delivery may be removed within 6-8 hours after delivery or when feeling returns to your legs. You should feel like you need to empty your bladder within the first 6-8 hours after the catheter has been removed.  In case you become weak, lightheaded, or faint, call your nurse before you get out of bed for the first time and before you take a shower for the first time.  Within the first few days after delivery, your breasts may begin to feel tender and full. This is called engorgement. Breast tenderness usually goes away within 48-72 hours after engorgement occurs. You may also notice milk leaking from your breasts. If you are not breastfeeding, do not stimulate your breasts. Breast stimulation can make your breasts produce more milk.  Spending as much time as possible with your newborn is very important. During this time, you and your newborn can feel close and get to know each other. Having your newborn stay in your room (rooming in) will help to strengthen the bond with your newborn. It will give you time to get to know your newborn and become comfortable caring for your newborn.  Your hormones change after delivery. Sometimes the hormone changes can temporarily cause you to feel sad or tearful. These feelings should not last more than a few days. If these feelings last longer than that, you should talk to your caregiver.  If desired, talk to your caregiver about methods of family planning or contraception.  Talk to your caregiver about immunizations. Your caregiver may want you to have the following immunizations before leaving the hospital:  Tetanus, diphtheria, and pertussis (Tdap) or tetanus and diphtheria (Td) immunization. It is very important that you and your family  (including grandparents) or others caring for your newborn are up-to-date with the Tdap or Td immunizations. The Tdap or Td immunization can help protect your newborn from getting ill.  Rubella immunization.  Varicella (chickenpox) immunization.  Influenza immunization. You should receive this annual immunization if you did not receive the immunization during your pregnancy.   This information is not intended to replace advice given to you by your health care provider. Make sure you discuss any questions you have with your health care provider.   Document Released: 04/17/2012 Document Reviewed: 04/17/2012 Elsevier Interactive Patient Education [employer].

## 2015-12-16 NOTE — Discharge Summary (Signed)
OB Discharge Summary     Patient Name: [name] DOB: 01-06-1986 MRN: [id number]  Date of admission: 12/14/2015 Delivering MD: Tania Ade H   Date of discharge: 12/16/2015  Admitting diagnosis: PREV CS;FETAL MACROSOMIA Intrauterine pregnancy: [redacted]w[redacted]d     Secondary diagnosis:  Active Problems:   S/P cesarean section  Additional problems: chronic hypertension, A1/BDM, anemia, s/p bilateral tubal ligation     Discharge diagnosis: Term Pregnancy Delivered and CHTN and a1/bdm                                                                                      Post partum procedures:none  Augmentation: none  Complications: None  Hospital course:  Sceduled C/S   30 y.o. yo [id number] at [redacted]w[redacted]d was admitted to the hospital 12/14/2015 for scheduled cesarean section with the following indication:Elective Repeat.  Membrane Rupture Time/Date: 7:49 AM ,12/14/2015   Patient delivered a Viable infant.12/14/2015  Details of operation can be found in separate operative note.  Pateint had an uncomplicated postpartum course.  She is ambulating, tolerating a regular diet, passing flatus, and urinating well. Patient is discharged home in stable condition on  12/16/2015   H 7.6 from 9.8, feraheme given, oral iron prescribed  BP 140-150s pp, will add hctz 12.5 to labetalol 400 bid, and has [facility] f/u next week for bp check, and we discussed preE return precautions          Physical exam  Filed Vitals:   12/15/15 1700 12/15/15 1907 12/16/15 0555 12/16/15 0632  BP: 158/90 139/90 150/80 147/79  Pulse: 99 90 83   Temp: 98.8 F (37.1 C)  98.2 F (36.8 C)   TempSrc:   Oral   Resp: 20  18   SpO2:       General: alert, cooperative and no distress Lochia: appropriate Uterine Fundus: firm Incision: Dressing is clean, dry, and intact DVT Evaluation: No cords or calf tenderness. No significant calf/ankle edema. Labs: Lab Results  Component Value Date   WBC 9.7 12/15/2015   HGB 7.6* 12/15/2015    HCT 23.6* 12/15/2015   MCV 80.3 12/15/2015   PLT 185 12/15/2015   CMP Latest Ref Rng 12/13/2015  Glucose 65 - 99 mg/dL 142(H)  BUN 6 - 20 mg/dL 8  Creatinine 0.44 - 1.00 mg/dL 0.69  Sodium 135 - 145 mmol/L 136  Potassium 3.5 - 5.1 mmol/L 3.8  Chloride 101 - 111 mmol/L 107  CO2 22 - 32 mmol/L 20(L)  Calcium 8.9 - 10.3 mg/dL 8.9  Total Protein 6.0 - 8.3 g/dL -  Total Bilirubin 0.3 - 1.2 mg/dL -  Alkaline Phos 39 - 117 U/L -  AST 0 - 37 U/L -  ALT 0 - 35 U/L -    Discharge instruction: per After Visit Summary and "Baby and Me Booklet".  After visit meds:    Medication List    STOP taking these medications        aspirin 81 MG tablet      TAKE these medications        acetaminophen 325 MG tablet  Commonly known as:  TYLENOL  Take 2 tablets (650  mg total) by mouth every 4 (four) hours as needed (for pain scale < 4).     ferrous sulfate 325 (65 FE) MG tablet  Take 1 tablet (325 mg total) by mouth 2 (two) times daily with a meal.     hydrochlorothiazide 12.5 MG tablet  Commonly known as:  HYDRODIURIL  Take 1 tablet (12.5 mg total) by mouth daily.     ibuprofen 600 MG tablet  Commonly known as:  ADVIL,MOTRIN  Take 1 tablet (600 mg total) by mouth every 6 (six) hours.     labetalol 200 MG tablet  Commonly known as:  NORMODYNE  Take 2 tablets (400 mg total) by mouth 2 (two) times daily.     oxyCODONE-acetaminophen 5-325 MG tablet  Commonly known as:  PERCOCET/ROXICET  Take 1 tablet by mouth every 6 (six) hours as needed (pain scale 4-7).     PRENATAL VITAMIN PO  Take by mouth.        Diet: routine diet  Activity: Advance as tolerated. Pelvic rest for 6 weeks.   Outpatient follow up:6 weeks Follow up Appt:Future Appointments Date Time Provider [place]  12/21/2015 10:00 AM Florian Buff, MD [facility]   Follow up Visit:No Follow-up on file.  Postpartum contraception: Tubal Ligation  Newborn Data: Live born female  Birth Weight: 9 lb 6.1  oz (4255 g) APGAR: 8, 9  Baby Feeding: Bottle Disposition:home with mother   12/16/2015 Desma Maxim, MD

## 2015-12-17 LAB — TYPE AND SCREEN
ABO/RH(D): O POS
Antibody Screen: NEGATIVE
UNIT DIVISION: 0
Unit division: 0

## 2015-12-21 ENCOUNTER — Ambulatory Visit (INDEPENDENT_AMBULATORY_CARE_PROVIDER_SITE_OTHER): Payer: BLUE CROSS/BLUE SHIELD | Admitting: Obstetrics & Gynecology

## 2015-12-21 ENCOUNTER — Encounter: Payer: Self-pay | Admitting: Obstetrics & Gynecology

## 2015-12-21 VITALS — BP 140/90 | HR 84 | Wt 314.0 lb

## 2015-12-21 DIAGNOSIS — B379 Candidiasis, unspecified: Secondary | ICD-10-CM | POA: Diagnosis not present

## 2015-12-21 DIAGNOSIS — Z98891 History of uterine scar from previous surgery: Secondary | ICD-10-CM

## 2015-12-21 NOTE — Progress Notes (Signed)
Patient ID: [name], female   DOB: 1985/11/04, 30 y.o.   MRN: [id number] Chief Complaint  Patient presents with  . Routine Post Op   Blood pressure 140/90, pulse 84, weight 314 lb (142.429 kg), unknown if currently breastfeeding.  HPI: Patient returns for routine postoperative follow-up having undergone 12/14/2015 on repeat Caesarean section with bilateral tubal ligation by fimbriectomy.  The patient's immediate postoperative recovery has been unremarkable. Since hospital discharge the patient reports no problems.   Current Outpatient Prescriptions: ferrous sulfate 325 (65 FE) MG tablet, Take 1 tablet (325 mg total) by mouth 2 (two) times daily with a meal., Disp: 60 tablet, Rfl: 2 hydrochlorothiazide (HYDRODIURIL) 12.5 MG tablet, Take 1 tablet (12.5 mg total) by mouth daily., Disp: 30 tablet, Rfl: 1 ibuprofen (ADVIL,MOTRIN) 600 MG tablet, Take 1 tablet (600 mg total) by mouth every 6 (six) hours., Disp: 30 tablet, Rfl: 0 labetalol (NORMODYNE) 200 MG tablet, Take 2 tablets (400 mg total) by mouth 2 (two) times daily., Disp: 60 tablet, Rfl: 3 Prenatal Vit-Fe Fumarate-FA (PRENATAL VITAMIN PO), Take by mouth., Disp: , Rfl:  oxyCODONE-acetaminophen (PERCOCET/ROXICET) 5-325 MG tablet, Take 1 tablet by mouth every 6 (six) hours as needed (pain scale 4-7). (Patient not taking: Reported on 12/21/2015), Disp: 30 tablet, Rfl: 0  No current facility-administered medications for this visit.    Blood pressure 140/90, pulse 84, weight 314 lb (142.429 kg), unknown if currently breastfeeding.  Physical Exam: Incision clean dry intact Gentian violet placed to manage intertriginous yeast   Diagnostic Tests:   Pathology: Bilateral tubal segments  Impression: Normal post op state  Plan: PP exam 5 weeks  Follow up: 5  weeks  EURE,LUTHER H, MD  Meds: No orders of the defined types were placed in this encounter.

## 2016-01-25 ENCOUNTER — Ambulatory Visit: Payer: BLUE CROSS/BLUE SHIELD | Admitting: Women's Health

## 2016-01-25 ENCOUNTER — Encounter: Payer: Self-pay | Admitting: *Deleted

## 2018-09-23 ENCOUNTER — Emergency Department (HOSPITAL_COMMUNITY): Payer: Commercial Managed Care - PPO

## 2018-09-23 ENCOUNTER — Emergency Department (HOSPITAL_COMMUNITY)
Admission: EM | Admit: 2018-09-23 | Discharge: 2018-09-23 | Disposition: A | Discharge status: Home / Self Care | Payer: Commercial Managed Care - PPO | Attending: Emergency Medicine | Admitting: Emergency Medicine

## 2018-09-23 ENCOUNTER — Encounter (HOSPITAL_COMMUNITY): Payer: Self-pay

## 2018-09-23 ENCOUNTER — Other Ambulatory Visit: Payer: Self-pay

## 2018-09-23 DIAGNOSIS — Z79899 Other long term (current) drug therapy: Secondary | ICD-10-CM | POA: Insufficient documentation

## 2018-09-23 DIAGNOSIS — J40 Bronchitis, not specified as acute or chronic: Secondary | ICD-10-CM | POA: Insufficient documentation

## 2018-09-23 DIAGNOSIS — I10 Essential (primary) hypertension: Secondary | ICD-10-CM | POA: Diagnosis not present

## 2018-09-23 DIAGNOSIS — R05 Cough: Secondary | ICD-10-CM | POA: Diagnosis present

## 2018-09-23 MED ORDER — DOXYCYCLINE HYCLATE 100 MG PO CAPS: 100.0000 mg | ORAL_CAPSULE | Freq: Two times a day (BID) | ORAL | 0 refills | Status: DC

## 2018-09-23 NOTE — ED Provider Notes (Signed)
[facility] EMERGENCY DEPARTMENT Provider Note   CSN: [id number] Arrival date & time: 09/23/18  1307    History   Chief Complaint Chief Complaint  Patient presents with  . Cough    HPI [name] is a 33 y.o. female.     The history is provided by the patient. No language interpreter was used.  Cough  Cough characteristics:  Productive Sputum characteristics:  Nondescript Severity:  Moderate Onset quality:  Gradual Timing:  Constant Progression:  Worsening Chronicity:  New Smoker: no   Relieved by:  Nothing Worsened by:  Nothing Ineffective treatments:  None tried Associated symptoms: shortness of breath     Past Medical History:  Diagnosis Date  . Anemia   . Gestational diabetes    diet controlled  . Gestational diabetes mellitus, antepartum   . Granuloma, skin    incisional pfannenstiel scar  . Hypertension   . Preterm labor   . Supervision of other high-risk pregnancy 05/26/2015    [facility]  Initiated Care at   43 weeks  FOB Lilla Shook  Dating By LMP/8 wek [place]  Pap 05/26/2015 neg  GC/CT Initial: -/-               36+wks:  Genetic Screen NT/IT: neg  CF screen Pos  FOB:  Anatomic [place] Female, limited view of head, repeat:  Flu vaccine 05/26/2015   Tdap Recommended ~ 28wks  Glucose Screen  2 hr  GBS   Feed Preference bottle  Contraception undecided  Circumcision yes  Childbirth Classes declined  Pediatrician [facility]        Patient Active Problem List   Diagnosis Date Noted  . S/P cesarean section 12/14/2015  . Desires VBAC (vaginal birth after cesarean) trial 10/27/2015  . 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 Gestational diabetes mellitus, class A1/B 06/03/2015  . History of gestational diabetes in prior pregnancy, currently pregnant in first trimester 05/26/2015  . History of VBAC 05/26/2015  . Supervision of other high-risk pregnancy 05/26/2015  . Chronic hypertension in pregnancy   . Obesity affecting pregnancy, antepartum    . Hx of preeclampsia, prior pregnancy, currently pregnant 01/20/2014  . Morbid obesity ([place]) 02/13/2013    Past Surgical History:  Procedure Laterality Date  . ABDOMINAL SURGERY     TUMMY TUCK EXCISED ENDOMETRIOSIS  . CESAREAN SECTION    . CESAREAN SECTION N/A 12/14/2015   Procedure: CESAREAN SECTION;  Surgeon: Florian Buff, MD;  Location: [facility];  Service: Obstetrics;  Laterality: N/A;  . ENDOMETRIAL FULGURATION  07/07/11  . NASAL SINUS SURGERY  2007  . TONSILLECTOMY  2008  . TUBAL LIGATION Bilateral 12/14/2015   Procedure: BILATERAL TUBAL LIGATION;  Surgeon: Florian Buff, MD;  Location: [place];  Service: Obstetrics;  Laterality: Bilateral;  fallopian tubes     OB History    Gravida  4   Para  3   Term  3   Preterm      AB  1   Living  2     SAB  1   TAB      Ectopic      Multiple  0   Live Births  2            Home Medications    Prior to Admission medications   Medication Sig Start Date End Date Taking? Authorizing Provider  ferrous sulfate 325 (65 FE) MG tablet Take 1 tablet (325 mg total) by mouth  2 (two) times daily with a meal. 12/16/15   Wouk, Ailene Rud, MD  hydrochlorothiazide (HYDRODIURIL) 12.5 MG tablet Take 1 tablet (12.5 mg total) by mouth daily. 12/16/15   Wouk, Ailene Rud, MD  ibuprofen (ADVIL,MOTRIN) 600 MG tablet Take 1 tablet (600 mg total) by mouth every 6 (six) hours. 12/16/15   Wouk, Ailene Rud, MD  labetalol (NORMODYNE) 200 MG tablet Take 2 tablets (400 mg total) by mouth 2 (two) times daily. 12/16/15   Wouk, Ailene Rud, MD  oxyCODONE-acetaminophen (PERCOCET/ROXICET) 5-325 MG tablet Take 1 tablet by mouth every 6 (six) hours as needed (pain scale 4-7). Patient not taking: Reported on 12/21/2015 12/16/15   Wouk, Ailene Rud, MD  Prenatal Vit-Fe Fumarate-FA (PRENATAL VITAMIN PO) Take by mouth.    [provider]    Family History Family History  Problem Relation Age of Onset  . Diabetes Mother    . Hypertension Mother   . Heart disease Father   . Cancer Paternal Grandmother   . Cancer Paternal Uncle     Social History Social History   Tobacco Use  . Smoking status: Never Smoker  . Smokeless tobacco: Never Used  Substance Use Topics  . Alcohol use: No  . Drug use: No     Allergies   Adhesive [tape]; Promethazine hcl; and Vicodin [hydrocodone-acetaminophen]   Review of Systems Review of Systems  Respiratory: Positive for cough and shortness of breath.   All other systems reviewed and are negative.    Physical Exam Updated Vital Signs BP 128/72   Pulse (!) 102   Temp 99.3 F (37.4 C) (Oral)   Resp 16   Ht 5\' 9"  (1.753 m)   Wt (!) 153.3 kg   LMP 09/22/2018   SpO2 99%   BMI 49.91 kg/m   Physical Exam Vitals signs and nursing note reviewed.  Constitutional:      Appearance: She is well-developed.  HENT:     Head: Normocephalic.     Right Ear: Tympanic membrane normal.     Left Ear: Tympanic membrane normal.     Nose: Nose normal.     Mouth/Throat:     Mouth: Mucous membranes are moist.  Eyes:     Pupils: Pupils are equal, round, and reactive to light.  Neck:     Musculoskeletal: Normal range of motion.  Cardiovascular:     Rate and Rhythm: Normal rate.     Pulses: Normal pulses.     Comments: Harsh breath sound  Pulmonary:     Effort: Pulmonary effort is normal.  Abdominal:     General: Abdomen is flat. There is no distension.  Musculoskeletal: Normal range of motion.  Skin:    General: Skin is warm.  Neurological:     Mental Status: She is alert and oriented to person, place, and time.  Psychiatric:        Mood and Affect: Mood normal.      ED Treatments / Results  Labs (all labs ordered are listed, but only abnormal results are displayed) Labs Reviewed - No data to display  EKG None  Radiology Dg Chest 2 View  Result Date: 09/23/2018 CLINICAL DATA:  Cough and chest congestion beginning 2 days ago. EXAM: CHEST - 2 VIEW  COMPARISON:  None. FINDINGS: Heart size is normal. Mediastinal shadows are normal. There is central bronchial thickening but no infiltrate, collapse or effusion. No significant bone finding. IMPRESSION: Bronchitis pattern.  No consolidation or collapse. Electronically Signed   By: Elta Guadeloupe  Shogry M.D.   On: 09/23/2018 13:51    Procedures Procedures (including critical care time)  Medications Ordered in ED Medications - No data to display   Initial Impression / Assessment and Plan / ED Course  I have reviewed the triage vital signs and the nursing notes.  Pertinent labs & imaging results that were available during my care of the patient were reviewed by me and considered in my medical decision making (see chart for details).        MDM   Chest xray shows bronchitis.  Pt given rx for doxycycline  Final Clinical Impressions(s) / ED Diagnoses   Final diagnoses:  Bronchitis    ED Discharge Orders         Ordered    doxycycline (VIBRAMYCIN) 100 MG capsule  2 times daily     09/23/18 1723        An After Visit Summary was printed and given to the patient.    Fransico Meadow, [place] 09/23/18 1724    Hayden Rasmussen, MD 09/24/18 0000

## 2018-09-23 NOTE — ED Triage Notes (Signed)
Pt reports that she started getting sick Saturday. Cough and congestion with chest tightness. Pt went to wellness nurse and was told her HR was 144. And she needed to come to ED

## 2022-06-14 ENCOUNTER — Encounter (HOSPITAL_COMMUNITY): Payer: Self-pay

## 2022-06-14 ENCOUNTER — Inpatient Hospital Stay (HOSPITAL_COMMUNITY)
Admission: EM | Admit: 2022-06-14 | Discharge: 2022-06-24 | DRG: 699 | Disposition: A | Discharge status: Home / Self Care | Payer: Medicaid Other | Attending: Family Medicine | Admitting: Family Medicine

## 2022-06-14 ENCOUNTER — Other Ambulatory Visit: Payer: Self-pay

## 2022-06-14 ENCOUNTER — Emergency Department (HOSPITAL_COMMUNITY): Payer: Medicaid Other

## 2022-06-14 DIAGNOSIS — Z28310 Unvaccinated for covid-19: Secondary | ICD-10-CM | POA: Diagnosis not present

## 2022-06-14 DIAGNOSIS — Z885 Allergy status to narcotic agent status: Secondary | ICD-10-CM

## 2022-06-14 DIAGNOSIS — N250 Renal osteodystrophy: Secondary | ICD-10-CM | POA: Diagnosis present

## 2022-06-14 DIAGNOSIS — N049 Nephrotic syndrome with unspecified morphologic changes: Secondary | ICD-10-CM | POA: Diagnosis not present

## 2022-06-14 DIAGNOSIS — D509 Iron deficiency anemia, unspecified: Secondary | ICD-10-CM | POA: Diagnosis present

## 2022-06-14 DIAGNOSIS — N179 Acute kidney failure, unspecified: Secondary | ICD-10-CM | POA: Insufficient documentation

## 2022-06-14 DIAGNOSIS — Z833 Family history of diabetes mellitus: Secondary | ICD-10-CM | POA: Diagnosis not present

## 2022-06-14 DIAGNOSIS — E8770 Fluid overload, unspecified: Secondary | ICD-10-CM | POA: Diagnosis present

## 2022-06-14 DIAGNOSIS — R600 Localized edema: Secondary | ICD-10-CM | POA: Insufficient documentation

## 2022-06-14 DIAGNOSIS — E1122 Type 2 diabetes mellitus with diabetic chronic kidney disease: Secondary | ICD-10-CM | POA: Diagnosis present

## 2022-06-14 DIAGNOSIS — I10 Essential (primary) hypertension: Secondary | ICD-10-CM | POA: Insufficient documentation

## 2022-06-14 DIAGNOSIS — N047 Nephrotic syndrome with diffuse crescentic glomerulonephritis: Principal | ICD-10-CM | POA: Diagnosis present

## 2022-06-14 DIAGNOSIS — R739 Hyperglycemia, unspecified: Secondary | ICD-10-CM | POA: Diagnosis not present

## 2022-06-14 DIAGNOSIS — E876 Hypokalemia: Secondary | ICD-10-CM | POA: Diagnosis not present

## 2022-06-14 DIAGNOSIS — Z8249 Family history of ischemic heart disease and other diseases of the circulatory system: Secondary | ICD-10-CM

## 2022-06-14 DIAGNOSIS — I129 Hypertensive chronic kidney disease with stage 1 through stage 4 chronic kidney disease, or unspecified chronic kidney disease: Secondary | ICD-10-CM | POA: Diagnosis present

## 2022-06-14 DIAGNOSIS — R040 Epistaxis: Secondary | ICD-10-CM | POA: Diagnosis present

## 2022-06-14 DIAGNOSIS — Z6841 Body Mass Index (BMI) 40.0 and over, adult: Secondary | ICD-10-CM | POA: Diagnosis not present

## 2022-06-14 DIAGNOSIS — Z79899 Other long term (current) drug therapy: Secondary | ICD-10-CM

## 2022-06-14 DIAGNOSIS — E875 Hyperkalemia: Secondary | ICD-10-CM | POA: Diagnosis not present

## 2022-06-14 DIAGNOSIS — L299 Pruritus, unspecified: Secondary | ICD-10-CM | POA: Diagnosis not present

## 2022-06-14 DIAGNOSIS — N189 Chronic kidney disease, unspecified: Secondary | ICD-10-CM | POA: Diagnosis present

## 2022-06-14 DIAGNOSIS — E8809 Other disorders of plasma-protein metabolism, not elsewhere classified: Secondary | ICD-10-CM | POA: Diagnosis present

## 2022-06-14 DIAGNOSIS — Z91048 Other nonmedicinal substance allergy status: Secondary | ICD-10-CM

## 2022-06-14 DIAGNOSIS — R0609 Other forms of dyspnea: Secondary | ICD-10-CM | POA: Diagnosis present

## 2022-06-14 DIAGNOSIS — R609 Edema, unspecified: Secondary | ICD-10-CM | POA: Insufficient documentation

## 2022-06-14 DIAGNOSIS — Z888 Allergy status to other drugs, medicaments and biological substances status: Secondary | ICD-10-CM

## 2022-06-14 DIAGNOSIS — N390 Urinary tract infection, site not specified: Secondary | ICD-10-CM | POA: Insufficient documentation

## 2022-06-14 DIAGNOSIS — D649 Anemia, unspecified: Secondary | ICD-10-CM | POA: Diagnosis not present

## 2022-06-14 DIAGNOSIS — R0602 Shortness of breath: Secondary | ICD-10-CM | POA: Diagnosis not present

## 2022-06-14 LAB — PREPARE RBC (CROSSMATCH)

## 2022-06-14 LAB — BASIC METABOLIC PANEL
Anion gap: 9 (ref 5–15)
BUN: 88 mg/dL — ABNORMAL HIGH (ref 6–20)
CO2: 20 mmol/L — ABNORMAL LOW (ref 22–32)
Calcium: 7.6 mg/dL — ABNORMAL LOW (ref 8.9–10.3)
Chloride: 110 mmol/L (ref 98–111)
Creatinine, Ser: 4 mg/dL — ABNORMAL HIGH (ref 0.44–1.00)
GFR, Estimated: 14 mL/min — ABNORMAL LOW (ref >60)
Glucose, Bld: 123 mg/dL — ABNORMAL HIGH (ref 70–99)
Potassium: 5.3 mmol/L — ABNORMAL HIGH (ref 3.5–5.1)
Sodium: 139 mmol/L (ref 135–145)

## 2022-06-14 LAB — CBC
HCT: 23.6 % — ABNORMAL LOW (ref 36.0–46.0)
Hemoglobin: 6.7 g/dL — CL (ref 12.0–15.0)
MCH: 20.5 pg — ABNORMAL LOW (ref 26.0–34.0)
MCHC: 28.4 g/dL — ABNORMAL LOW (ref 30.0–36.0)
MCV: 72.2 fL — ABNORMAL LOW (ref 80.0–100.0)
Platelets: 339 10*3/uL (ref 150–400)
RBC: 3.27 MIL/uL — ABNORMAL LOW (ref 3.87–5.11)
RDW: 16.9 % — ABNORMAL HIGH (ref 11.5–15.5)
WBC: 13.5 10*3/uL — ABNORMAL HIGH (ref 4.0–10.5)
nRBC: 0 % (ref 0.0–0.2)

## 2022-06-14 LAB — POTASSIUM: Potassium: 5.7 mmol/L — ABNORMAL HIGH (ref 3.5–5.1)

## 2022-06-14 LAB — HEPATIC FUNCTION PANEL
ALT: 12 U/L (ref 0–44)
AST: 16 U/L (ref 15–41)
Albumin: 3 g/dL — ABNORMAL LOW (ref 3.5–5.0)
Alkaline Phosphatase: 96 U/L (ref 38–126)
Bilirubin, Direct: <0.1 mg/dL (ref 0.0–0.2)
Total Bilirubin: 0.4 mg/dL (ref 0.3–1.2)
Total Protein: 7 g/dL (ref 6.5–8.1)

## 2022-06-14 LAB — BRAIN NATRIURETIC PEPTIDE: B Natriuretic Peptide: 305 pg/mL — ABNORMAL HIGH (ref 0.0–100.0)

## 2022-06-14 LAB — TROPONIN I (HIGH SENSITIVITY)
Troponin I (High Sensitivity): 6 ng/L (ref <18)
Troponin I (High Sensitivity): 8 ng/L (ref <18)

## 2022-06-14 MED ORDER — SODIUM CHLORIDE 0.9 % IV BOLUS: 1000.0000 mL | Freq: Once | INTRAVENOUS | Status: DC

## 2022-06-14 MED ORDER — FUROSEMIDE 10 MG/ML IJ SOLN
60.0000 mg | Freq: Once | INTRAMUSCULAR | Status: AC
  Administered 2022-06-14: 60 mg via INTRAVENOUS
  Filled 2022-06-14: qty 6

## 2022-06-14 MED ORDER — INSULIN ASPART 100 UNIT/ML IV SOLN
10.0000 [IU] | Freq: Once | INTRAVENOUS | Status: AC
  Administered 2022-06-14: 10 [IU] via INTRAVENOUS

## 2022-06-14 MED ORDER — SODIUM CHLORIDE 0.9 % IV SOLN
10.0000 mL/h | Freq: Once | INTRAVENOUS | Status: AC
  Administered 2022-06-15: 10 mL/h via INTRAVENOUS

## 2022-06-14 MED ORDER — DEXTROSE 50 % IV SOLN
1.0000 | Freq: Once | INTRAVENOUS | Status: AC
  Administered 2022-06-14: 50 mL via INTRAVENOUS
  Filled 2022-06-14: qty 50

## 2022-06-14 NOTE — ED Provider Notes (Signed)
[facility] EMERGENCY DEPARTMENT Provider Note   CSN: [id number] Arrival date & time: 06/14/22  6283     History  No chief complaint on file.   [name] is a 36 y.o. female.  HPI   This is a very pleasant 36 year old female with medical history anemia, tubal ligation and no other documented comorbidities presenting today due to shortness of breath and "swelling".  States a week ago started having nausea, vomiting and diarrhea.  This actually improved starting yesterday.  Yesterday she noticed swelling to her ankles bilaterally, today states the swelling started at her lower ankles and has been moving upwards.  She feels very short of breath which has been going on for the last week but worsened acutely today.  She does feel very winded with exertion, denies any specific chest pain but does feel palpitations.  Endorses fatigue.  Patient is not on any daily medicine, she took a Zofran for the nausea but denies any other medicine use.    Denies any blood in the vomit or in the stool.  Home Medications Prior to Admission medications   Medication Sig Start Date End Date Taking? Authorizing Provider  doxycycline (VIBRAMYCIN) 100 MG capsule Take 1 capsule (100 mg total) by mouth 2 (two) times daily. 09/23/18   Fransico Meadow, PA-C  ferrous sulfate 325 (65 FE) MG tablet Take 1 tablet (325 mg total) by mouth 2 (two) times daily with a meal. 12/16/15   Wouk, Ailene Rud, MD  hydrochlorothiazide (HYDRODIURIL) 12.5 MG tablet Take 1 tablet (12.5 mg total) by mouth daily. 12/16/15   Wouk, Ailene Rud, MD  ibuprofen (ADVIL,MOTRIN) 600 MG tablet Take 1 tablet (600 mg total) by mouth every 6 (six) hours. 12/16/15   Wouk, Ailene Rud, MD  labetalol (NORMODYNE) 200 MG tablet Take 2 tablets (400 mg total) by mouth 2 (two) times daily. 12/16/15   Wouk, Ailene Rud, MD  oxyCODONE-acetaminophen (PERCOCET/ROXICET) 5-325 MG tablet Take 1 tablet by mouth every 6 (six) hours as needed (pain scale  4-7). Patient not taking: Reported on 12/21/2015 12/16/15   Wouk, Ailene Rud, MD  Prenatal Vit-Fe Fumarate-FA (PRENATAL VITAMIN PO) Take by mouth.    [provider]      Allergies    Adhesive [tape], Promethazine hcl, and Vicodin [hydrocodone-acetaminophen]    Review of Systems   Review of Systems  Physical Exam Updated Vital Signs BP (!) 142/90   Pulse (!) 101   Temp 98.3 F (36.8 C) (Oral)   Resp 18   Ht 5\' 9"  (1.753 m)   Wt (!) 156.5 kg   LMP 06/14/2022   SpO2 99%   BMI 50.95 kg/m  Physical Exam Vitals and nursing note reviewed. Exam conducted with a chaperone present.  Constitutional:      Appearance: Normal appearance.  HENT:     Head: Normocephalic and atraumatic.  Eyes:     General: No scleral icterus.       Right eye: No discharge.        Left eye: No discharge.     Extraocular Movements: Extraocular movements intact.     Pupils: Pupils are equal, round, and reactive to light.  Cardiovascular:     Rate and Rhythm: Regular rhythm. Tachycardia present.     Pulses: Normal pulses.     Heart sounds: Normal heart sounds. No murmur heard.    No friction rub. No gallop.  Pulmonary:     Effort: Pulmonary effort is normal. Tachypnea present. No respiratory distress.  Breath sounds: Normal breath sounds.     Comments: Able to speak in complete sentences but tachypneic. Abdominal:     General: Abdomen is flat. Bowel sounds are normal. There is no distension.     Palpations: Abdomen is soft.     Tenderness: There is no abdominal tenderness.  Musculoskeletal:     Right lower leg: Edema present.     Left lower leg: Edema present.  Skin:    General: Skin is warm and dry.     Coloration: Skin is not jaundiced.  Neurological:     Mental Status: She is alert. Mental status is at baseline.     Coordination: Coordination normal.     ED Results / Procedures / Treatments   Labs (all labs ordered are listed, but only abnormal results are displayed) Labs  Reviewed  BASIC METABOLIC PANEL - Abnormal; Notable for the following components:      Result Value   Potassium 5.3 (*)    CO2 20 (*)    Glucose, Bld 123 (*)    BUN 88 (*)    Creatinine, Ser 4.00 (*)    Calcium 7.6 (*)    GFR, Estimated 14 (*)    All other components within normal limits  CBC - Abnormal; Notable for the following components:   WBC 13.5 (*)    RBC 3.27 (*)    Hemoglobin 6.7 (*)    HCT 23.6 (*)    MCV 72.2 (*)    MCH 20.5 (*)    MCHC 28.4 (*)    RDW 16.9 (*)    All other components within normal limits  BRAIN NATRIURETIC PEPTIDE - Abnormal; Notable for the following components:   B Natriuretic Peptide 305.0 (*)    All other components within normal limits  HEPATIC FUNCTION PANEL - Abnormal; Notable for the following components:   Albumin 3.0 (*)    All other components within normal limits  POTASSIUM - Abnormal; Notable for the following components:   Potassium 5.7 (*)    All other components within normal limits  URINALYSIS, ROUTINE W REFLEX MICROSCOPIC  PREGNANCY, URINE  POTASSIUM  POTASSIUM  POTASSIUM  URINE ELECTROLYTES  URINALYSIS, ROUTINE W REFLEX MICROSCOPIC  CREATININE, URINE, RANDOM  VITAMIN B12  FOLATE  IRON AND TIBC  FERRITIN  RETICULOCYTES  I-STAT BETA HCG BLOOD, ED ([facility], [facility], [facility] ONLY)  TYPE AND SCREEN  PREPARE RBC (CROSSMATCH)  TROPONIN I (HIGH SENSITIVITY)  TROPONIN I (HIGH SENSITIVITY)    EKG None  Radiology CT Renal Stone Study  Result Date: 06/14/2022 CLINICAL DATA:  Swelling low hemoglobin elevated creatinine EXAM: CT ABDOMEN AND PELVIS WITHOUT CONTRAST TECHNIQUE: Multidetector CT imaging of the abdomen and pelvis was performed following the standard protocol without IV contrast. RADIATION DOSE REDUCTION: This exam was performed according to the departmental dose-optimization program which includes automated exposure control, adjustment of the mA and/or kV according to patient size and/or use of iterative reconstruction technique.  COMPARISON:  CT 08/08/2011 FINDINGS: Lower chest: Lung bases demonstrate small bilateral pleural effusions. Probable passive atelectasis in the left lower lobe. Upper normal cardiac size. Hepatobiliary: No focal liver abnormality is seen. No gallstones, gallbladder wall thickening, or biliary dilatation. Pancreas: Unremarkable. No pancreatic ductal dilatation or surrounding inflammatory changes. Spleen: Enlarged up to 16.3 cm. Adrenals/Urinary Tract: Adrenal glands are unremarkable. Kidneys are normal, without renal calculi, focal lesion, or hydronephrosis. Bladder is unremarkable. Stomach/Bowel: Stomach is within normal limits. Appendix appears normal. No evidence of bowel wall thickening, distention, or inflammatory  changes. Vascular/Lymphatic: No significant vascular findings are present. No enlarged abdominal or pelvic lymph nodes. Reproductive: Uterus and bilateral adnexa are unremarkable. Other: Negative for pelvic effusion or free air. Small fat containing umbilical hernia. Edema within the subcutaneous soft tissues of the abdominal wall. Musculoskeletal: No acute osseous abnormality. IMPRESSION: 1. No CT evidence for acute intra-abdominal or pelvic abnormality. 2. Small bilateral pleural effusions. Mild diffuse subcutaneous edema. 3. Spleen appears slightly enlarged Electronically Signed   By: Donavan Foil M.D.   On: 06/14/2022 22:14   DG Chest 2 View  Result Date: 06/14/2022 CLINICAL DATA:  Chest pain EXAM: CHEST - 2 VIEW COMPARISON:  09/23/2018 FINDINGS: Small bilateral pleural effusions. Diffuse increased interstitial opacity could reflect low-grade edema. Patchy airspace opacity at the bases. Normal cardiac size. No pneumothorax IMPRESSION: Small bilateral pleural effusions with patchy airspace disease at the bases which may be due to atelectasis or pneumonia. Diffuse increased interstitial opacity could reflect low-grade edema. Electronically Signed   By: Donavan Foil M.D.   On: 06/14/2022 19:59     Procedures Procedures    Medications Ordered in ED Medications  0.9 %  sodium chloride infusion (has no administration in time range)  insulin aspart (novoLOG) injection 10 Units (10 Units Intravenous Given 06/14/22 2332)    And  dextrose 50 % solution 50 mL (has no administration in time range)  furosemide (LASIX) injection 60 mg (60 mg Intravenous Given 06/14/22 2322)    ED Course/ Medical Decision Making/ A&P                           Medical Decision Making Amount and/or Complexity of Data Reviewed Labs: ordered. Radiology: ordered.  Risk Prescription drug management. Decision regarding hospitalization.   Patient presents due to shortness of breath and swelling.  Differential includes but not limited to AKI, pneumonia, ACS, nephrotic syndrome, UTI, metabolic abnormality  I ordered, viewed and interpreted laboratory work-up. CBC shows a leukocytosis of 13.5.  Patient also has a hemoglobin of 6.7. CMP is notable for creatinine of 4.0 significantly increased and previously BUN elevated 88, hyperkalemic at 5.3 and albumin is low at 3. UA is pending. Troponin low at 8.  I ordered and viewed imaging studies.  Agree with radiologist interpretation. CT renal notable for bilateral pleural effusions and some mild diffuse subcutaneous edema.  Consistent with possible nephrotic syndrome. Chest x-ray also shows pleural effusions.  EKG shows sinus tachycardia.  I have ordered blood transfusion for symptomatic anemia in the setting of hemoglobin 6.7.  Patient will need admission for nephrotic syndrome.  I will consult the hospitalist.         Final Clinical Impression(s) / ED Diagnoses Final diagnoses:  Nephrotic syndrome  Symptomatic anemia    Rx / DC Orders ED Discharge Orders     None         Sherrill Raring, Hershal Coria 06/14/22 2334    Teressa Lower, MD 06/16/22 [phone]

## 2022-06-14 NOTE — ED Triage Notes (Signed)
Sick since last week. Pt report sob. Yesterday pt ankle started swollen. Pt states now she is swollen all over and it is not stopping. Chest pressure when walking.

## 2022-06-15 ENCOUNTER — Inpatient Hospital Stay (HOSPITAL_COMMUNITY): Payer: Medicaid Other

## 2022-06-15 ENCOUNTER — Other Ambulatory Visit: Payer: Self-pay

## 2022-06-15 DIAGNOSIS — D649 Anemia, unspecified: Secondary | ICD-10-CM

## 2022-06-15 DIAGNOSIS — N390 Urinary tract infection, site not specified: Secondary | ICD-10-CM | POA: Diagnosis not present

## 2022-06-15 DIAGNOSIS — N179 Acute kidney failure, unspecified: Secondary | ICD-10-CM | POA: Insufficient documentation

## 2022-06-15 DIAGNOSIS — R0609 Other forms of dyspnea: Secondary | ICD-10-CM

## 2022-06-15 DIAGNOSIS — E875 Hyperkalemia: Secondary | ICD-10-CM | POA: Insufficient documentation

## 2022-06-15 DIAGNOSIS — I10 Essential (primary) hypertension: Secondary | ICD-10-CM | POA: Insufficient documentation

## 2022-06-15 DIAGNOSIS — N047 Nephrotic syndrome with diffuse crescentic glomerulonephritis: Secondary | ICD-10-CM | POA: Diagnosis not present

## 2022-06-15 DIAGNOSIS — R609 Edema, unspecified: Secondary | ICD-10-CM | POA: Insufficient documentation

## 2022-06-15 LAB — ECHOCARDIOGRAM COMPLETE
AR max vel: 2.26 cm2
AV Area VTI: 2.31 cm2
AV Area mean vel: 2.18 cm2
AV Mean grad: 4 mmHg
AV Peak grad: 8.2 mmHg
Ao pk vel: 1.43 m/s
Area-P 1/2: 4.04 cm2
Height: 69 in
MV VTI: 2.09 cm2
S' Lateral: 3.4 cm
Weight: 5520 oz

## 2022-06-15 LAB — URINALYSIS, ROUTINE W REFLEX MICROSCOPIC
Bilirubin Urine: NEGATIVE
Cellular Cast, UA: 6
Glucose, UA: NEGATIVE mg/dL
Ketones, ur: NEGATIVE mg/dL
Nitrite: NEGATIVE
Protein, ur: >=300 mg/dL — AB
Specific Gravity, Urine: 1.013 (ref 1.005–1.030)
WBC, UA: >50 WBC/hpf — ABNORMAL HIGH (ref 0–5)
pH: 5 (ref 5.0–8.0)

## 2022-06-15 LAB — CBC WITH DIFFERENTIAL/PLATELET
Abs Immature Granulocytes: 0.06 10*3/uL (ref 0.00–0.07)
Basophils Absolute: 0.1 10*3/uL (ref 0.0–0.1)
Basophils Relative: 1 %
Eosinophils Absolute: 0.2 10*3/uL (ref 0.0–0.5)
Eosinophils Relative: 2 %
HCT: 23.6 % — ABNORMAL LOW (ref 36.0–46.0)
Hemoglobin: 7 g/dL — ABNORMAL LOW (ref 12.0–15.0)
Immature Granulocytes: 1 %
Lymphocytes Relative: 19 %
Lymphs Abs: 2.1 10*3/uL (ref 0.7–4.0)
MCH: 21.7 pg — ABNORMAL LOW (ref 26.0–34.0)
MCHC: 29.7 g/dL — ABNORMAL LOW (ref 30.0–36.0)
MCV: 73.3 fL — ABNORMAL LOW (ref 80.0–100.0)
Monocytes Absolute: 1.2 10*3/uL — ABNORMAL HIGH (ref 0.1–1.0)
Monocytes Relative: 11 %
Neutro Abs: 7.5 10*3/uL (ref 1.7–7.7)
Neutrophils Relative %: 66 %
Platelets: 271 10*3/uL (ref 150–400)
RBC: 3.22 MIL/uL — ABNORMAL LOW (ref 3.87–5.11)
RDW: 18.1 % — ABNORMAL HIGH (ref 11.5–15.5)
WBC: 11.1 10*3/uL — ABNORMAL HIGH (ref 4.0–10.5)
nRBC: 0 % (ref 0.0–0.2)

## 2022-06-15 LAB — CREATININE, URINE, RANDOM: Creatinine, Urine: 140.6 mg/dL

## 2022-06-15 LAB — RETICULOCYTES
Immature Retic Fract: 22.6 % — ABNORMAL HIGH (ref 2.3–15.9)
RBC.: 3.17 MIL/uL — ABNORMAL LOW (ref 3.87–5.11)
Retic Count, Absolute: 45.3 10*3/uL (ref 19.0–186.0)
Retic Ct Pct: 1.4 % (ref 0.4–3.1)

## 2022-06-15 LAB — IRON AND TIBC
Iron: 17 ug/dL — ABNORMAL LOW (ref 28–170)
Saturation Ratios: 6 % — ABNORMAL LOW (ref 10.4–31.8)
TIBC: 307 ug/dL (ref 250–450)
UIBC: 290 ug/dL

## 2022-06-15 LAB — COMPREHENSIVE METABOLIC PANEL
ALT: 11 U/L (ref 0–44)
AST: 12 U/L — ABNORMAL LOW (ref 15–41)
Albumin: 2.6 g/dL — ABNORMAL LOW (ref 3.5–5.0)
Alkaline Phosphatase: 80 U/L (ref 38–126)
Anion gap: 10 (ref 5–15)
BUN: 88 mg/dL — ABNORMAL HIGH (ref 6–20)
CO2: 18 mmol/L — ABNORMAL LOW (ref 22–32)
Calcium: 7.1 mg/dL — ABNORMAL LOW (ref 8.9–10.3)
Chloride: 110 mmol/L (ref 98–111)
Creatinine, Ser: 4.07 mg/dL — ABNORMAL HIGH (ref 0.44–1.00)
GFR, Estimated: 14 mL/min — ABNORMAL LOW (ref >60)
Glucose, Bld: 132 mg/dL — ABNORMAL HIGH (ref 70–99)
Potassium: 5.5 mmol/L — ABNORMAL HIGH (ref 3.5–5.1)
Sodium: 138 mmol/L (ref 135–145)
Total Bilirubin: 0.4 mg/dL (ref 0.3–1.2)
Total Protein: 6.3 g/dL — ABNORMAL LOW (ref 6.5–8.1)

## 2022-06-15 LAB — PREGNANCY, URINE: Preg Test, Ur: NEGATIVE

## 2022-06-15 LAB — MAGNESIUM: Magnesium: 2.6 mg/dL — ABNORMAL HIGH (ref 1.7–2.4)

## 2022-06-15 LAB — SEDIMENTATION RATE: Sed Rate: 68 mm/hr — ABNORMAL HIGH (ref 0–22)

## 2022-06-15 LAB — TROPONIN I (HIGH SENSITIVITY): Troponin I (High Sensitivity): 6 ng/L (ref <18)

## 2022-06-15 LAB — POTASSIUM
Potassium: 5.3 mmol/L — ABNORMAL HIGH (ref 3.5–5.1)
Potassium: 5.6 mmol/L — ABNORMAL HIGH (ref 3.5–5.1)

## 2022-06-15 LAB — FOLATE: Folate: 9.6 ng/mL (ref >5.9)

## 2022-06-15 LAB — VITAMIN B12: Vitamin B-12: 378 pg/mL (ref 180–914)

## 2022-06-15 LAB — TSH: TSH: 3.181 u[IU]/mL (ref 0.350–4.500)

## 2022-06-15 LAB — HIV ANTIBODY (ROUTINE TESTING W REFLEX): HIV Screen 4th Generation wRfx: NONREACTIVE

## 2022-06-15 LAB — FERRITIN: Ferritin: 20 ng/mL (ref 11–307)

## 2022-06-15 MED ORDER — HYDROCHLOROTHIAZIDE 25 MG PO TABS: 12.5000 mg | ORAL_TABLET | Freq: Every day | ORAL | Status: DC

## 2022-06-15 MED ORDER — FUROSEMIDE 10 MG/ML IJ SOLN
160.0000 mg | Freq: Two times a day (BID) | INTRAVENOUS | Status: DC
  Administered 2022-06-15 (×2): 160 mg via INTRAVENOUS
  Filled 2022-06-15 (×5): qty 16

## 2022-06-15 MED ORDER — SODIUM CHLORIDE 0.9 % IV SOLN
1.0000 g | INTRAVENOUS | Status: DC
  Administered 2022-06-15 – 2022-06-16 (×2): 1 g via INTRAVENOUS
  Filled 2022-06-15 (×2): qty 10

## 2022-06-15 MED ORDER — FERROUS SULFATE 325 (65 FE) MG PO TABS
325.0000 mg | ORAL_TABLET | Freq: Two times a day (BID) | ORAL | Status: DC
  Administered 2022-06-15: 325 mg via ORAL
  Filled 2022-06-15: qty 1

## 2022-06-15 MED ORDER — SODIUM CHLORIDE 0.9 % IV SOLN
250.0000 mg | Freq: Every day | INTRAVENOUS | Status: AC
  Administered 2022-06-15 – 2022-06-18 (×4): 250 mg via INTRAVENOUS
  Filled 2022-06-15 (×2): qty 20
  Filled 2022-06-15 (×2): qty 250

## 2022-06-15 MED ORDER — LABETALOL HCL 200 MG PO TABS: 100.0000 mg | ORAL_TABLET | Freq: Two times a day (BID) | ORAL | Status: DC

## 2022-06-15 MED ORDER — HYDRALAZINE HCL 20 MG/ML IJ SOLN
10.0000 mg | Freq: Once | INTRAMUSCULAR | Status: AC
  Administered 2022-06-15: 10 mg via INTRAVENOUS
  Filled 2022-06-15: qty 1

## 2022-06-15 MED ORDER — LORAZEPAM 2 MG/ML IJ SOLN
1.0000 mg | Freq: Once | INTRAMUSCULAR | Status: AC
  Administered 2022-06-15: 1 mg via INTRAVENOUS
  Filled 2022-06-15: qty 1

## 2022-06-15 MED ORDER — LABETALOL HCL 200 MG PO TABS
400.0000 mg | ORAL_TABLET | Freq: Two times a day (BID) | ORAL | Status: DC
  Administered 2022-06-15: 400 mg via ORAL
  Filled 2022-06-15: qty 2

## 2022-06-15 MED ORDER — ALBUTEROL SULFATE (2.5 MG/3ML) 0.083% IN NEBU: 2.5000 mg | INHALATION_SOLUTION | Freq: Four times a day (QID) | RESPIRATORY_TRACT | Status: DC | PRN

## 2022-06-15 MED ORDER — HEPARIN SODIUM (PORCINE) 5000 UNIT/ML IJ SOLN
5000.0000 [IU] | Freq: Three times a day (TID) | INTRAMUSCULAR | Status: DC
  Administered 2022-06-15: 5000 [IU] via SUBCUTANEOUS
  Filled 2022-06-15: qty 1

## 2022-06-15 MED ORDER — DARBEPOETIN ALFA 200 MCG/0.4ML IJ SOSY
200.0000 ug | PREFILLED_SYRINGE | Freq: Once | INTRAMUSCULAR | Status: AC
  Administered 2022-06-15: 200 ug via SUBCUTANEOUS
  Filled 2022-06-15: qty 0.4

## 2022-06-15 MED ORDER — DIPHENHYDRAMINE HCL 50 MG/ML IJ SOLN
25.0000 mg | Freq: Once | INTRAMUSCULAR | Status: AC
  Administered 2022-06-15: 25 mg via INTRAVENOUS
  Filled 2022-06-15: qty 1

## 2022-06-15 MED ORDER — ACETAMINOPHEN 650 MG RE SUPP: 650.0000 mg | Freq: Four times a day (QID) | RECTAL | Status: DC | PRN

## 2022-06-15 MED ORDER — ONDANSETRON HCL 4 MG/2ML IJ SOLN
4.0000 mg | Freq: Four times a day (QID) | INTRAMUSCULAR | Status: DC | PRN
  Administered 2022-06-16 – 2022-06-24 (×8): 4 mg via INTRAVENOUS
  Filled 2022-06-15 (×8): qty 2

## 2022-06-15 MED ORDER — MORPHINE SULFATE (PF) 2 MG/ML IV SOLN: 2.0000 mg | INTRAVENOUS | Status: DC | PRN

## 2022-06-15 MED ORDER — ACETAMINOPHEN 325 MG PO TABS
650.0000 mg | ORAL_TABLET | Freq: Four times a day (QID) | ORAL | Status: DC | PRN
  Administered 2022-06-15 – 2022-06-17 (×3): 650 mg via ORAL
  Filled 2022-06-15 (×3): qty 2

## 2022-06-15 MED ORDER — FERROUS SULFATE 325 (65 FE) MG PO TABS
325.0000 mg | ORAL_TABLET | Freq: Two times a day (BID) | ORAL | Status: DC
  Administered 2022-06-20 – 2022-06-24 (×9): 325 mg via ORAL
  Filled 2022-06-15 (×10): qty 1

## 2022-06-15 MED ORDER — ONDANSETRON HCL 4 MG PO TABS: 4.0000 mg | ORAL_TABLET | Freq: Four times a day (QID) | ORAL | Status: DC | PRN

## 2022-06-15 NOTE — Assessment & Plan Note (Signed)
-   Potassium as high as 5.7 - No EKG changes - Monitor on telemetry - Lasix ordered - Insulin/D50 ordered - Continue to monitor

## 2022-06-15 NOTE — Progress Notes (Signed)
Around 0330 the second unit of blood had just been started. While I was in the room for the first 15 minutes the patient stated that her head was itching and she just did not feel right. I notified the MD. Benadryl was ordered. This still did not relieve the itching,. The blood was stopped and normal saline was given. Ativan was then ordered. Patient was able to rest quietly.  Around 0615 I attempted a bladder scan but did not see no more than 168ml of urine in the bladder. It was hard to get a good reading due to patient's position in bed but I did not feel comfortable laying the patient flat in the bed.   I asked the patient did she feel like I could start the blood again and she stated yes. I informed her and her father that she only would receive an hour of the blood due to the four hour window.

## 2022-06-15 NOTE — Progress Notes (Signed)
*  PRELIMINARY RESULTS* Echocardiogram 2D Echocardiogram has been performed.  [name] 06/15/2022, 10:39 AM

## 2022-06-15 NOTE — Progress Notes (Signed)
Patient arrived to room. Admission was completed and blood started. Patient is showing no signs of reaction at this time. Urine was collected and taken to lab. Urine was cloudy in appearance. Patient becomes short of breath walking from bathroom back to bed. Telemetry attached to patient as well as continuous pulse ox.

## 2022-06-15 NOTE — H&P (Signed)
History and Physical    Patient: [name] OJJ:[id number] DOB: [birth year] DOA: 06/14/2022 DOS: the patient was seen and examined on 06/15/2022 PCP: Patient, No Pcp Per  Patient coming from: Home  Chief Complaint: No chief complaint on file.  HPI: [name] is a 36 y.o. female with medical history significant of anemia, gestational diabetes, gestational hypertension, and more presents the ED with a chief complaint of dyspnea and peripheral edema.  Patient reports that the peripheral edema started a couple days ago.  At first she noticed at the ankles.  Yesterday had moved all the way up to her neck.  She reports new periorbital edema.  She has never had swelling like this before.  During pregnancy she had peripheral edema at the ankles before, but never this extensive.  Patient reports that her urine output has been decreased as well.  Its been about 12 hours since she has had any urine output despite being given Lasix a couple of hours ago.  She denies any dysuria.  She is not sure if she has hematuria because she has been on her period.  Patient does report that she has had epistaxis for about 1 week.  Sometimes she had blood trickling out of her nose, sometimes it was just when she blew her nose.  She reports even on the days that it was not trickling out of her nose she can feel it draining down her throat.  Was only on the left side.  Patient reports she has had nausea and vomiting, but denies hematemesis.  She reports she was vomiting every 30 minutes for 5 hours.  Zofran finally aborted the nausea.  Eating had no effect on the nausea or vomiting.  Patient reports 2-3 times per day diarrhea.  No hematochezia or melena.  Patient denies abdominal pain.  She has had no back pain or flank pain.  She had no fever.  She reports dyspnea.  She noticed the dyspnea about a week ago.  She had associated heaviness in her chest.  Troponins normal at 6, 8.  Patient reports orthopnea and has been propping  herself up on pillows.  Her dyspnea is worse with exertion as well.  Patient reports she is never had anything like this before.  Patient has not had any recent medication changes.  Patient reports that she does not take a single medication daily.  She does not use NSAIDs.  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  Her symptoms is Getting worse.  She had no recent travel.  No recent vaccines.  Patient does not smoke, does not drink alcohol, does not use illicit drugs.  She is not vaccinated for COVID.  Patient is full code.  Patient has no further complaints at this time. Review of Systems: As mentioned in the history of present illness. All other systems reviewed and are negative. Past Medical History:  Diagnosis Date   Anemia    Gestational diabetes    diet controlled   Gestational diabetes mellitus, antepartum    Granuloma, skin    incisional pfannenstiel scar   Hypertension    Preterm labor    Supervision of other high-risk pregnancy 05/26/2015    Clinic [facility]  Initiated Care at   8 weeks  FOB Lilla Shook  Dating By LMP/8 wek [place]  Pap 05/26/2015 neg  GC/CT Initial: -/-  36+wks:  Genetic Screen NT/IT: neg  CF screen Pos  FOB:  Anatomic [place] Female, limited view of head, repeat:  Flu vaccine 05/26/2015   Tdap Recommended ~ 28wks  Glucose Screen  2 hr  GBS   Feed Preference bottle  Contraception undecided  Circumcision yes  Childbirth Classes declined  Pediatrician [facility]       Past Surgical History:  Procedure Laterality Date   ABDOMINAL SURGERY     TUMMY TUCK EXCISED ENDOMETRIOSIS   CESAREAN SECTION     CESAREAN SECTION N/A 12/14/2015   Procedure: CESAREAN SECTION;  Surgeon: Florian Buff, MD;  Location: [facility];  Service: Obstetrics;  Laterality: N/A;   ENDOMETRIAL FULGURATION  07/07/11   NASAL SINUS SURGERY  2007   TONSILLECTOMY  2008   TUBAL LIGATION Bilateral 12/14/2015   Procedure:  BILATERAL TUBAL LIGATION;  Surgeon: Florian Buff, MD;  Location: [facility];  Service: Obstetrics;  Laterality: Bilateral;  fallopian tubes   Social History:  reports that she has never smoked. She has never used smokeless tobacco. She reports that she does not drink alcohol and does not use drugs.  Allergies  Allergen Reactions   Adhesive [Tape] Other (See Comments)    Causes blistering of skin   Promethazine Hcl Nausea And Vomiting   Vicodin [Hydrocodone-Acetaminophen] Nausea And Vomiting    Family History  Problem Relation Age of Onset   Diabetes Mother    Hypertension Mother    Heart disease Father    Cancer Paternal Grandmother    Cancer Paternal Uncle     Prior to Admission medications   Medication Sig Start Date End Date Taking? Authorizing Provider  doxycycline (VIBRAMYCIN) 100 MG capsule Take 1 capsule (100 mg total) by mouth 2 (two) times daily. 09/23/18   Fransico Meadow, PA-C  ferrous sulfate 325 (65 FE) MG tablet Take 1 tablet (325 mg total) by mouth 2 (two) times daily with a meal. 12/16/15   Wouk, Ailene Rud, MD  hydrochlorothiazide (HYDRODIURIL) 12.5 MG tablet Take 1 tablet (12.5 mg total) by mouth daily. 12/16/15   Wouk, Ailene Rud, MD  ibuprofen (ADVIL,MOTRIN) 600 MG tablet Take 1 tablet (600 mg total) by mouth every 6 (six) hours. 12/16/15   Wouk, Ailene Rud, MD  labetalol (NORMODYNE) 200 MG tablet Take 2 tablets (400 mg total) by mouth 2 (two) times daily. 12/16/15   Wouk, Ailene Rud, MD  oxyCODONE-acetaminophen (PERCOCET/ROXICET) 5-325 MG tablet Take 1 tablet by mouth every 6 (six) hours as needed (pain scale 4-7). Patient not taking: Reported on 12/21/2015 12/16/15   Wouk, Ailene Rud, MD  Prenatal Vit-Fe Fumarate-FA (PRENATAL VITAMIN PO) Take by mouth.    [provider]    Physical Exam: Vitals:   06/15/22 0032 06/15/22 0053 06/15/22 0308 06/15/22 0330  BP: (!) 162/97 (!) 165/109 (!) 147/74 (!) 147/74  Pulse: (!) 108 99 (!) 101 (!) 101   Resp: (!) 24 (!) 24 (!) 30 (!) 30  Temp: 98.2 F (36.8 C) 97.6 F (36.4 C) 98.4 F (36.9 C) 98.4 F (36.9 C)  TempSrc: Oral Oral Oral   SpO2: 99% 99% 97%   Weight:      Height:       1.  General: Patient lying supine in bed,  no acute distress   2. Psychiatric: Alert and oriented x 3, mood and behavior normal for situation, pleasant and cooperative with exam   3. Neurologic: Speech and language are normal, face is symmetric, moves all  4 extremities voluntarily, at baseline without acute deficits on limited exam   4. HEENMT:  Head is atraumatic, normocephalic, pupils reactive to light, neck is supple, trachea is midline, mucous membranes are moist   5. Respiratory : Lungs are clear to auscultation bilaterally without wheezing, rhonchi, rales, no cyanosis, no increase in work of breathing or accessory muscle use   6. Cardiovascular : Heart rate normal, rhythm is regular, no murmurs, rubs or gallops, significant peripheral edema, peripheral pulses palpated   7. Gastrointestinal:  Abdomen is soft, minimally distended with fluid, nontender to palpation bowel sounds active, no masses or organomegaly palpated   8. Skin:  Skin is warm, dry and intact without rashes, acute lesions, or ulcers on limited exam   9.Musculoskeletal:  No acute deformities or trauma, no asymmetry in tone, significant peripheral edema, peripheral pulses palpated, no tenderness to palpation in the extremities  Data Reviewed: In the ED Temp 98.3, heart rate 110-113, respiratory rate 13-25, blood pressure 136/81-179/114 Leukocytosis at 13.5, hemoglobin 6.7, platelets 339 Hyperkalemia at 5.3, BUN elevated at 88, creatinine elevated at 4.00 from 0.69 CT renal study shows no acute intra-abdominal or intrapelvic abnormality.  Small bilateral pleural effusions Chest x-ray shows small bilateral pleural effusions with patchy opacity that could be low-grade edema EKG shows normal sinus rhythm, QTc 415, heart rate  99 - Admission requested for fluid overload is likely related to nephrotic syndrome  Assessment and Plan: * Symptomatic anemia - Hemoglobin down to 6.7 - Patient reports epistaxis every day for a week - She is also on her.  Currently - Has a history of iron deficiency anemia - Anemia panel pending - No reported melena or hematochezia - 2 units packed red blood cells ordered in the ED - Symptomatic with dyspnea - Continue to monitor  Peripheral edema - Significant peripheral edema pitting up to lower abdomen - Patient reports that she feels the edema goes all the way up to her neck - Denies periorbital edema - Likely related to nephrotic syndrome, but given this fluid overload we will check an echo in the a.m. - Renal diet with fluid restriction - Monitor intake and output - Continue to monitor  UTI (urinary tract infection) - UA is with greater than 50 white blood cells -Serum leukocytosis at 13.5 - Urine culture pending - In the meantime Rocephin started - Continue to monitor  HTN (hypertension) - Blood pressure quite high with a reading of 179/114 - Patient's only history of hypertension is gestational hypertension - Hydralazine 10 mg IV x1 given - Likely related to current kidney pathology - Check TSH in the a.m. as well - Continue to monitor  Hyperkalemia - Potassium as high as 5.7 - No EKG changes - Monitor on telemetry - Lasix ordered - Insulin/D50 ordered - Continue to monitor  AKI (acute kidney injury) ([place]) - Likely nephrotic syndrome with greater than 300 protein on UA - Large leukocytes with urine white blood cells greater than 50 also - CT renal shows no acute intra-abdominal or intrapelvic abnormality - Significant peripheral edema - Associated hyperkalemia - Continue Lasix - Consult nephrology - Urine electrolytes pending - Anemia could also be contributing - Continue with transfusion - Trend in the a.m.       Advance Care Planning:   Code  Status: Full Code  Consults: Nephrology  Family Communication: Father at bedside  Severity of Illness: The appropriate patient status for this patient is INPATIENT. Inpatient status is judged to be reasonable and necessary in  order to provide the required intensity of service to ensure the patient's safety. The patient's presenting symptoms, physical exam findings, and initial radiographic and laboratory data in the context of their chronic comorbidities is felt to place them at high risk for further clinical deterioration. Furthermore, it is not anticipated that the patient will be medically stable for discharge from the hospital within 2 midnights of admission.   * I certify that at the point of admission it is my clinical judgment that the patient will require inpatient hospital care spanning beyond 2 midnights from the point of admission due to high intensity of service, high risk for further deterioration and high frequency of surveillance required.*  Author: Rolla Plate, DO 06/15/2022 3:43 AM  For on call review [email or web address].

## 2022-06-15 NOTE — Progress Notes (Signed)
EKG done and placed in patient's chart.

## 2022-06-15 NOTE — Assessment & Plan Note (Signed)
-   UA is with greater than 50 white blood cells -Serum leukocytosis at 13.5 - Urine culture pending - In the meantime Rocephin started - Continue to monitor

## 2022-06-15 NOTE — Consult Note (Signed)
[facility] Renal Consultation Note  Requesting MD: Memon Indication for Consultation: ? AKI  HPI:  [name] is a 36 y.o. female with obesity, gestational DM, HTN but no meds- does not go to the doctor-  last known kidney function labs in 2017 which were normal.  She is on no medications.  Had been in her USOH until 10 days ago-  noted congestion/cough/nausea-   thought just coming down with a bug.  Then 2 days ago noticed she was swelling more and her weight had gone up even with not eating due to stomach upset.  Her edema worsened-  had orthopnea so presented to medical attention-  crt was 4 and hgb 6.7 and albumin of 3-  urine has blood and protein.  She had not noted any change to her urine-  said she is on her period.  She denies any history of kidney stones or UTIs, has not been taking any NSAIDS, no FH of kidney diease.  She is not currently nauseated   Creat  Date/Time Value Ref Range Status  03/06/2014 07:43 AM 0.58 0.50 - 1.10 mg/dL Final  02/17/2014 10:41 AM 0.51 0.50 - 1.10 mg/dL Final  01/20/2014 12:43 PM 0.66 0.50 - 1.10 mg/dL Final   Creatinine, Ser  Date/Time Value Ref Range Status  06/15/2022 08:32 AM 4.07 (H) 0.44 - 1.00 mg/dL Final  06/14/2022 07:49 PM 4.00 (H) 0.44 - 1.00 mg/dL Final  12/13/2015 11:55 AM 0.69 0.44 - 1.00 mg/dL Final  07/30/2014 04:15 PM 0.73 0.50 - 1.10 mg/dL Final  07/30/2014 08:30 AM 0.63 0.50 - 1.10 mg/dL Final  07/03/2014 10:30 AM 0.79 0.50 - 1.10 mg/dL Final  03/17/2014 06:10 PM 0.71 0.50 - 1.10 mg/dL Final  08/04/2011 04:33 PM 0.76 0.50 - 1.10 mg/dL Final  04/16/2011 10:30 AM 0.74 0.50 - 1.10 mg/dL Final  03/23/2011 02:18 PM 0.80 0.50 - 1.10 mg/dL Final  08/22/2009 02:24 PM 0.80 0.4 - 1.2 mg/dL Final  10/15/2008 12:58 PM 0.58 0.4 - 1.2 mg/dL Final  10/14/2008 04:25 PM 0.61 0.4 - 1.2 mg/dL Final     PMHx:   Past Medical History:  Diagnosis Date   Anemia    Gestational diabetes    diet controlled   Gestational  diabetes mellitus, antepartum    Granuloma, skin    incisional pfannenstiel scar   Hypertension    Preterm labor    Supervision of other high-risk pregnancy 05/26/2015    Clinic [facility]  Initiated Care at   8 weeks  FOB Lilla Shook  Dating By LMP/8 wek [place]  Pap 05/26/2015 neg  GC/CT Initial: -/-               36+wks:  Genetic Screen NT/IT: neg  CF screen Pos  FOB:  Anatomic [place] Female, limited view of head, repeat:  Flu vaccine 05/26/2015   Tdap Recommended ~ 28wks  Glucose Screen  2 hr  GBS   Feed Preference bottle  Contraception undecided  Circumcision yes  Childbirth Classes declined  Pediatrician [facility]        Past Surgical History:  Procedure Laterality Date   ABDOMINAL SURGERY     TUMMY TUCK EXCISED ENDOMETRIOSIS   CESAREAN SECTION     CESAREAN SECTION N/A 12/14/2015   Procedure: CESAREAN SECTION;  Surgeon: Florian Buff, MD;  Location: [facility];  Service: Obstetrics;  Laterality: N/A;   ENDOMETRIAL FULGURATION  07/07/11   NASAL SINUS SURGERY  2007   TONSILLECTOMY  2008  TUBAL LIGATION Bilateral 12/14/2015   Procedure: BILATERAL TUBAL LIGATION;  Surgeon: Florian Buff, MD;  Location: [facility];  Service: Obstetrics;  Laterality: Bilateral;  fallopian tubes    Family Hx:  Family History  Problem Relation Age of Onset   Diabetes Mother    Hypertension Mother    Heart disease Father    Cancer Paternal Grandmother    Cancer Paternal Uncle     Social History:  reports that she has never smoked. She has never used smokeless tobacco. She reports that she does not drink alcohol and does not use drugs.  Allergies:  Allergies  Allergen Reactions   Adhesive [Tape] Other (See Comments)    Causes blistering of skin   Promethazine Hcl Nausea And Vomiting   Vicodin [Hydrocodone-Acetaminophen] Nausea And Vomiting    Medications: Prior to Admission medications   Medication Sig Start Date End Date Taking? Authorizing Provider  doxycycline  (VIBRAMYCIN) 100 MG capsule Take 1 capsule (100 mg total) by mouth 2 (two) times daily. Patient not taking: Reported on 06/15/2022 09/23/18   Fransico Meadow, PA-C  ferrous sulfate 325 (65 FE) MG tablet Take 1 tablet (325 mg total) by mouth 2 (two) times daily with a meal. Patient not taking: Reported on 06/15/2022 12/16/15   Wouk, Ailene Rud, MD  hydrochlorothiazide (HYDRODIURIL) 12.5 MG tablet Take 1 tablet (12.5 mg total) by mouth daily. Patient not taking: Reported on 06/15/2022 12/16/15   Wouk, Ailene Rud, MD  ibuprofen (ADVIL,MOTRIN) 600 MG tablet Take 1 tablet (600 mg total) by mouth every 6 (six) hours. Patient not taking: Reported on 06/15/2022 12/16/15   Wouk, Ailene Rud, MD  labetalol (NORMODYNE) 200 MG tablet Take 2 tablets (400 mg total) by mouth 2 (two) times daily. Patient not taking: Reported on 06/15/2022 12/16/15   Wouk, Ailene Rud, MD  oxyCODONE-acetaminophen (PERCOCET/ROXICET) 5-325 MG tablet Take 1 tablet by mouth every 6 (six) hours as needed (pain scale 4-7). Patient not taking: Reported on 12/21/2015 12/16/15   Wouk, Ailene Rud, MD  Prenatal Vit-Fe Fumarate-FA (PRENATAL VITAMIN PO) Take by mouth. Patient not taking: Reported on 06/15/2022    [provider]    I have reviewed the patient's current medications.  Labs:  Results for orders placed or performed during the hospital encounter of 06/14/22 (from the past 48 hour(s))  Basic metabolic panel     Status: Abnormal   Collection Time: 06/14/22  7:49 PM  Result Value Ref Range   Sodium 139 135 - 145 mmol/L   Potassium 5.3 (H) 3.5 - 5.1 mmol/L   Chloride 110 98 - 111 mmol/L   CO2 20 (L) 22 - 32 mmol/L   Glucose, Bld 123 (H) 70 - 99 mg/dL    Comment: Glucose reference range applies only to samples taken after fasting for at least 8 hours.   BUN 88 (H) 6 - 20 mg/dL   Creatinine, Ser 4.00 (H) 0.44 - 1.00 mg/dL   Calcium 7.6 (L) 8.9 - 10.3 mg/dL   GFR, Estimated 14 (L) >60 mL/min    Comment: (NOTE) Calculated  using the CKD-EPI Creatinine Equation (2021)    Anion gap 9 5 - 15    Comment: Performed at [facility], [address], [place], [place] [address]  CBC     Status: Abnormal   Collection Time: 06/14/22  7:49 PM  Result Value Ref Range   WBC 13.5 (H) 4.0 - 10.5 K/uL   RBC 3.27 (L) 3.87 - 5.11 MIL/uL  Hemoglobin 6.7 (LL) 12.0 - 15.0 g/dL    Comment: Reticulocyte Hemoglobin testing may be clinically indicated, consider ordering this additional test [id number] THIS CRITICAL RESULT HAS VERIFIED AND BEEN CALLED TO [facility]T BY BOBBIE MATTHEWS ON 11 08 2023 AT 2034, AND HAS BEEN READ BACK.     HCT 23.6 (L) 36.0 - 46.0 %   MCV 72.2 (L) 80.0 - 100.0 fL   MCH 20.5 (L) 26.0 - 34.0 pg   MCHC 28.4 (L) 30.0 - 36.0 g/dL   RDW 16.9 (H) 11.5 - 15.5 %   Platelets 339 150 - 400 K/uL   nRBC 0.0 0.0 - 0.2 %    Comment: Performed at [facility], [address], [place], [place] [address]  Troponin I (High Sensitivity)     Status: None   Collection Time: 06/14/22  7:49 PM  Result Value Ref Range   Troponin I (High Sensitivity) 6 <18 ng/L    Comment: (NOTE) Elevated high sensitivity troponin I (hsTnI) values and significant  changes across serial measurements may suggest ACS but many other  chronic and acute conditions are known to elevate hsTnI results.  Refer to the "Links" section for chest pain algorithms and additional  guidance. Performed at [facility], [address], [place], [place] [address]   Type and screen [facility]     Status: None (Preliminary result)   Collection Time: 06/14/22  9:19 PM  Result Value Ref Range   ABO/RH(D) O POS    Antibody Screen NEG    Sample Expiration 06/17/2022,2359    Unit Number [id number]    Blood Component Type RED CELLS,LR    Unit division 00    Status of Unit ISSUED    Transfusion Status OK TO TRANSFUSE    Crossmatch Result      Compatible Performed at [facility], [address], [place], [place] [address]    Unit Number [id number]     Blood Component Type RED CELLS,LR    Unit division 00    Status of Unit ISSUED    Transfusion Status OK TO TRANSFUSE    Crossmatch Result Compatible   Troponin I (High Sensitivity)     Status: None   Collection Time: 06/14/22  9:19 PM  Result Value Ref Range   Troponin I (High Sensitivity) 8 <18 ng/L    Comment: (NOTE) Elevated high sensitivity troponin I (hsTnI) values and significant  changes across serial measurements may suggest ACS but many other  chronic and acute conditions are known to elevate hsTnI results.  Refer to the "Links" section for chest pain algorithms and additional  guidance. Performed at [facility], [address] [place], [place] [address]   Brain natriuretic peptide     Status: Abnormal   Collection Time: 06/14/22  9:19 PM  Result Value Ref Range   B Natriuretic Peptide 305.0 (H) 0.0 - 100.0 pg/mL    Comment: Performed at [facility], [address] [place], [place] [address]  Hepatic function panel     Status: Abnormal   Collection Time: 06/14/22  9:19 PM  Result Value Ref Range   Total Protein 7.0 6.5 - 8.1 g/dL   Albumin 3.0 (L) 3.5 - 5.0 g/dL   AST 16 15 - 41 U/L   ALT 12 0 - 44 U/L   Alkaline Phosphatase 96 38 - 126 U/L   Total Bilirubin 0.4 0.3 - 1.2 mg/dL   Bilirubin, Direct <0.1 0.0 - 0.2 mg/dL   Indirect Bilirubin NOT CALCULATED  0.3 - 0.9 mg/dL    Comment: Performed at [facility], [address], [place], [place] [address]  Prepare RBC (crossmatch)     Status: None   Collection Time: 06/14/22  9:19 PM  Result Value Ref Range   Order Confirmation      ORDER PROCESSED BY BLOOD BANK Performed at [facility], [address], [place], [place] [address]   Potassium     Status: Abnormal   Collection Time: 06/14/22  9:19 PM  Result Value Ref Range   Potassium 5.7 (H) 3.5 - 5.1 mmol/L    Comment: Performed at [facility], [address], [place], [place] [address]  Urinalysis, Routine w reflex microscopic Urine, Unspecified Source      Status: Abnormal   Collection Time: 06/15/22  1:20 AM  Result Value Ref Range   Color, Urine YELLOW YELLOW   APPearance CLOUDY (A) CLEAR   Specific Gravity, Urine 1.013 1.005 - 1.030   pH 5.0 5.0 - 8.0   Glucose, UA NEGATIVE NEGATIVE mg/dL   Hgb urine dipstick LARGE (A) NEGATIVE   Bilirubin Urine NEGATIVE NEGATIVE   Ketones, ur NEGATIVE NEGATIVE mg/dL   Protein, ur >=300 (A) NEGATIVE mg/dL   Nitrite NEGATIVE NEGATIVE   Leukocytes,Ua LARGE (A) NEGATIVE   RBC / HPF 21-50 0 - 5 RBC/hpf   WBC, UA >50 (H) 0 - 5 WBC/hpf   Bacteria, UA RARE (A) NONE SEEN   Squamous Epithelial / LPF 0-5 0 - 5   WBC Clumps PRESENT    Hyaline Casts, UA PRESENT    Cellular Cast, UA 6     Comment: Performed at [facility], [address], [place], [place] [address]  Pregnancy, urine     Status: None   Collection Time: 06/15/22  1:20 AM  Result Value Ref Range   Preg Test, Ur NEGATIVE NEGATIVE    Comment:        THE SENSITIVITY OF THIS METHODOLOGY IS >20 mIU/mL. Performed at [facility], [address], [place], [place] [address]   Creatinine, urine, random     Status: None   Collection Time: 06/15/22  1:20 AM  Result Value Ref Range   Creatinine, Urine 140.60 mg/dL    Comment: Performed at [facility], [address] [place], [place] [address]  Vitamin B12     Status: None   Collection Time: 06/15/22  8:14 AM  Result Value Ref Range   Vitamin B-12 378 180 - 914 pg/mL    Comment: (NOTE) This assay is not validated for testing neonatal or myeloproliferative syndrome specimens for Vitamin B12 levels. Performed at [facility], [address] [place], [place] [address]   Folate     Status: None   Collection Time: 06/15/22  8:14 AM  Result Value Ref Range   Folate 9.6 >5.9 ng/mL    Comment: Performed at [facility], [address] [place], [place] [address]  Iron and TIBC     Status: Abnormal   Collection Time: 06/15/22  8:14 AM  Result Value Ref Range   Iron 17 (L) 28 - 170 ug/dL   TIBC 307  250 - 450 ug/dL   Saturation Ratios 6 (L) 10.4 - 31.8 %   UIBC 290 ug/dL    Comment: Performed at [facility], [address], [place],  [address]  Ferritin     Status: None   Collection Time: 06/15/22  8:14 AM  Result Value Ref Range   Ferritin 20 11 - 307 ng/mL  Comment: Performed at [facility], [address] [place], [place] [address]  Reticulocytes     Status: Abnormal   Collection Time: 06/15/22  8:14 AM  Result Value Ref Range   Retic Ct Pct 1.4 0.4 - 3.1 %   RBC. 3.17 (L) 3.87 - 5.11 MIL/uL   Retic Count, Absolute 45.3 19.0 - 186.0 K/uL   Immature Retic Fract 22.6 (H) 2.3 - 15.9 %    Comment: Performed at [facility], [address], [place], [place] [address]  CBC with Differential/Platelet     Status: Abnormal   Collection Time: 06/15/22  8:14 AM  Result Value Ref Range   WBC 11.1 (H) 4.0 - 10.5 K/uL   RBC 3.22 (L) 3.87 - 5.11 MIL/uL   Hemoglobin 7.0 (L) 12.0 - 15.0 g/dL    Comment: Reticulocyte Hemoglobin testing may be clinically indicated, consider ordering this additional test [id number]    HCT 23.6 (L) 36.0 - 46.0 %   MCV 73.3 (L) 80.0 - 100.0 fL   MCH 21.7 (L) 26.0 - 34.0 pg   MCHC 29.7 (L) 30.0 - 36.0 g/dL   RDW 18.1 (H) 11.5 - 15.5 %   Platelets 271 150 - 400 K/uL   nRBC 0.0 0.0 - 0.2 %   Neutrophils Relative % 66 %   Neutro Abs 7.5 1.7 - 7.7 K/uL   Lymphocytes Relative 19 %   Lymphs Abs 2.1 0.7 - 4.0 K/uL   Monocytes Relative 11 %   Monocytes Absolute 1.2 (H) 0.1 - 1.0 K/uL   Eosinophils Relative 2 %   Eosinophils Absolute 0.2 0.0 - 0.5 K/uL   Basophils Relative 1 %   Basophils Absolute 0.1 0.0 - 0.1 K/uL   Immature Granulocytes 1 %   Abs Immature Granulocytes 0.06 0.00 - 0.07 K/uL    Comment: Performed at [facility], [address] [place], [place] [address]  TSH     Status: None   Collection Time: 06/15/22  8:14 AM  Result Value Ref Range   TSH 3.181 0.350 - 4.500 uIU/mL    Comment: Performed by a 3rd Generation assay with a  functional sensitivity of <=0.01 uIU/mL. Performed at [facility], [address] [place], [place] [address]   Troponin I (High Sensitivity)     Status: None   Collection Time: 06/15/22  8:14 AM  Result Value Ref Range   Troponin I (High Sensitivity) 6 <18 ng/L    Comment: (NOTE) Elevated high sensitivity troponin I (hsTnI) values and significant  changes across serial measurements may suggest ACS but many other  chronic and acute conditions are known to elevate hsTnI results.  Refer to the "Links" section for chest pain algorithms and additional  guidance. Performed at [facility], [address] [place], [place] [address]   Comprehensive metabolic panel     Status: Abnormal   Collection Time: 06/15/22  8:32 AM  Result Value Ref Range   Sodium 138 135 - 145 mmol/L   Potassium 5.5 (H) 3.5 - 5.1 mmol/L   Chloride 110 98 - 111 mmol/L   CO2 18 (L) 22 - 32 mmol/L   Glucose, Bld 132 (H) 70 - 99 mg/dL    Comment: Glucose reference range applies only to samples taken after fasting for at least 8 hours.   BUN 88 (H) 6 - 20 mg/dL   Creatinine, Ser 4.07 (H) 0.44 - 1.00 mg/dL   Calcium 7.1 (L) 8.9 - 10.3 mg/dL   Total Protein 6.3 (L) 6.5 -  8.1 g/dL   Albumin 2.6 (L) 3.5 - 5.0 g/dL   AST 12 (L) 15 - 41 U/L   ALT 11 0 - 44 U/L   Alkaline Phosphatase 80 38 - 126 U/L   Total Bilirubin 0.4 0.3 - 1.2 mg/dL   GFR, Estimated 14 (L) >60 mL/min    Comment: (NOTE) Calculated using the CKD-EPI Creatinine Equation (2021)    Anion gap 10 5 - 15    Comment: Performed at [facility], [address] [place], [place] [address]  Magnesium     Status: Abnormal   Collection Time: 06/15/22  8:32 AM  Result Value Ref Range   Magnesium 2.6 (H) 1.7 - 2.4 mg/dL    Comment: Performed at [facility], [address], [place], [place] [address]     ROS:  A comprehensive review of systems was negative except for: Respiratory: positive for dyspnea on exertion Cardiovascular: positive for lower extremity  edema  Physical Exam: Vitals:   06/15/22 0725 06/15/22 0730  BP:  125/88  Pulse: 79 79  Resp: 20 (!) 21  Temp:  98.3 F (36.8 C)  SpO2: 98% 94%     General:  alert, obese, NAD HEENT: PERRLA, EOMI, mucous membranes moist Neck: no JVD Heart: RRR Lungs: dec BS at bases Abdomen: obese, soft, non tender Extremities: pitting edema but also obese Skin: warm and dry-  no rash Neuro: alert, oriented, non focal   Assessment/Plan: 36 year old WF with no past medical history and no medications-  presents with a crt of 4-  proteinuria and hematuria-  possible viral syndrome prodrome-  also anemia and hypoalbuminemia  1.Renal- dont have any recent kidney lab values.  So dont know if this change in renal function is acute or been happening for the last 6 years. She has proteinuria, hematuria  and hypoalbuminemia so suspicious for a GN.  Quantifying protein and checking serologies (ANCA, ANA, antiGBM and also for post strep GN).  Also check renal ultrasound.  If kidneys are not small and shrunken suspect we will need to do a kidney biopsy to evaluate-  probably Monday.  No empiric treatment yet as there is a possibility this has been happening for a while and the horse is out of the barn.  There are no absolute indications for dialysis at this time  2. Hypertension/volume  - is overloaded-  was given lasix but will increase dose and follow  3. Anemia  - is severe and makes me think her CKD is not new-  has been transfused -  will also give iron and ESA.  Her platelets are OK but will check haptoglobin to look for hemolysis as well  4. Potassium-  slightly high after blood-  suspect will come down If diuresis is successful 5. Hypoalbuminemia-  could be due to nephrotic syndrome or just in the setting of recent viral illness and not eating.  Quantifying proteinuria will help determine which    Louis Meckel 06/15/2022, 9:42 AM

## 2022-06-15 NOTE — Assessment & Plan Note (Signed)
-   Blood pressure quite high with a reading of 179/114 - Patient's only history of hypertension is gestational hypertension - Hydralazine 10 mg IV x1 given - Likely related to current kidney pathology - Check TSH in the a.m. as well - Continue to monitor

## 2022-06-15 NOTE — Progress Notes (Signed)
Urine output of 51ml of bloody urine (pt states she is on her menses) post lasix administration. Post-void bladder scan performed to evaluate for urinary retention, 296 ml identified in bladder.

## 2022-06-15 NOTE — Progress Notes (Signed)
Blood infusion stopped, IV flushed and saline locked. Pt is sleeping, resp even and non-labored at 21/min. Skin warm and dry, color appropriate with cap refill < 3 sec.

## 2022-06-15 NOTE — Progress Notes (Signed)
Patient called staff to room with c/o feeling hot and having Shortness of Breath. Blood infusing at this time, blood paused. Vital signs stable. MD paged and told to pause blood for now. NS started on patient, O2 placed for comfort and MD placed order for fan. Will continue to monitor.

## 2022-06-15 NOTE — Assessment & Plan Note (Signed)
-   Hemoglobin down to 6.7 - Patient reports epistaxis every day for a week - She is also on her.  Currently - Has a history of iron deficiency anemia - Anemia panel pending - No reported melena or hematochezia - 2 units packed red blood cells ordered in the ED - Symptomatic with dyspnea - Continue to monitor

## 2022-06-15 NOTE — Progress Notes (Signed)
Patient admitted to the hospital earlier this morning by Dr. Clearence Ped  Patient seen and examined.  Feels that dyspnea on exertion is mildly better than it was on admission although she does still get winded.  Continues to have significant edema.  Reports some urine output since receiving Lasix today.  Assessment/plan  Symptomatic iron deficiency anemia -Possibly related to blood loss from patient menses -Hemoglobin was down to 6.7 on admission -She has received 1 unit of PRBC, second unit was discontinued due to development of itching -She also received iron infusion as well as Aranesp -Repeat CBC in a.m.  Possible AKI -No prior labs for comparison -Creatinine currently elevated at 4 -Nephrology following, appreciate input -Renal ultrasound shows normal-sized kidneys -Further work-up including ANCA, ANA, anti-GBM, ASO titers have been ordered. -Sed rate 68 -Checking urine protein -May need to consider renal biopsy  Hypokalemia -Hopefully, this should trend down with Lasix  Hypoalbuminemia -Possibly nephrotic syndrome, pending quantification by urine studies  Volume overload -Currently on intravenous Lasix -Monitor urine output -Echocardiogram shows normal ejection fraction  Possible UTI -Empirically started on ceftriaxone -Follow-up urine culture  Hypertension -Continue to trend blood pressures with diuresis -She is not on any chronic meds  Obesity, class III -BMI 50.9 -Continue to monitor weight with diuresis

## 2022-06-15 NOTE — Assessment & Plan Note (Addendum)
-   Likely nephrotic syndrome with greater than 300 protein on UA - Large leukocytes with urine white blood cells greater than 50 also - CT renal shows no acute intra-abdominal or intrapelvic abnormality - Significant peripheral edema - Associated hyperkalemia - Continue Lasix - Consult nephrology - Urine electrolytes pending - Anemia could also be contributing - Continue with transfusion - Trend in the a.m.

## 2022-06-15 NOTE — Progress Notes (Signed)
  Transition of Care [facility]) Screening Note   Patient Details  Name: [name] Date of Birth: 02-02-1986   Transition of Care [facility]) CM/SW Contact:    Iona Beard, [place] Phone Number: 06/15/2022, 11:53 AM  CSW noted per chart review that pt does not have insurance nor PCP listed. Pt is agreeable to [facility] referral. CSW made [facility] referral and they will attempt to reach pt after D/C to get pt established with services.   Transition of Care Department [facility]) has reviewed patient and no TOC needs have been identified at this time. We will continue to monitor patient advancement through interdisciplinary progression rounds. If new patient transition needs arise, please place a TOC consult.

## 2022-06-15 NOTE — Assessment & Plan Note (Signed)
-   Significant peripheral edema pitting up to lower abdomen - Patient reports that she feels the edema goes all the way up to her neck - Denies periorbital edema - Likely related to nephrotic syndrome, but given this fluid overload we will check an echo in the a.m. - Renal diet with fluid restriction - Monitor intake and output - Continue to monitor

## 2022-06-16 ENCOUNTER — Other Ambulatory Visit: Payer: Self-pay | Admitting: Internal Medicine

## 2022-06-16 DIAGNOSIS — I10 Essential (primary) hypertension: Secondary | ICD-10-CM | POA: Diagnosis not present

## 2022-06-16 DIAGNOSIS — N390 Urinary tract infection, site not specified: Secondary | ICD-10-CM | POA: Diagnosis not present

## 2022-06-16 DIAGNOSIS — N179 Acute kidney failure, unspecified: Secondary | ICD-10-CM

## 2022-06-16 DIAGNOSIS — N184 Chronic kidney disease, stage 4 (severe): Secondary | ICD-10-CM

## 2022-06-16 DIAGNOSIS — D649 Anemia, unspecified: Secondary | ICD-10-CM

## 2022-06-16 LAB — CBC
HCT: 24.7 % — ABNORMAL LOW (ref 36.0–46.0)
Hemoglobin: 7.2 g/dL — ABNORMAL LOW (ref 12.0–15.0)
MCH: 21.5 pg — ABNORMAL LOW (ref 26.0–34.0)
MCHC: 29.1 g/dL — ABNORMAL LOW (ref 30.0–36.0)
MCV: 73.7 fL — ABNORMAL LOW (ref 80.0–100.0)
Platelets: 281 10*3/uL (ref 150–400)
RBC: 3.35 MIL/uL — ABNORMAL LOW (ref 3.87–5.11)
RDW: 18.5 % — ABNORMAL HIGH (ref 11.5–15.5)
WBC: 11.6 10*3/uL — ABNORMAL HIGH (ref 4.0–10.5)
nRBC: 0 % (ref 0.0–0.2)

## 2022-06-16 LAB — TYPE AND SCREEN
ABO/RH(D): O POS
Antibody Screen: NEGATIVE
Unit division: 0
Unit division: 0

## 2022-06-16 LAB — URINE CULTURE: Culture: 10000 — AB

## 2022-06-16 LAB — RENAL FUNCTION PANEL
Albumin: 2.7 g/dL — ABNORMAL LOW (ref 3.5–5.0)
Anion gap: 10 (ref 5–15)
BUN: 84 mg/dL — ABNORMAL HIGH (ref 6–20)
CO2: 18 mmol/L — ABNORMAL LOW (ref 22–32)
Calcium: 7.1 mg/dL — ABNORMAL LOW (ref 8.9–10.3)
Chloride: 111 mmol/L (ref 98–111)
Creatinine, Ser: 4.15 mg/dL — ABNORMAL HIGH (ref 0.44–1.00)
GFR, Estimated: 14 mL/min — ABNORMAL LOW (ref >60)
Glucose, Bld: 166 mg/dL — ABNORMAL HIGH (ref 70–99)
Phosphorus: 9.1 mg/dL — ABNORMAL HIGH (ref 2.5–4.6)
Potassium: 5.3 mmol/L — ABNORMAL HIGH (ref 3.5–5.1)
Sodium: 139 mmol/L (ref 135–145)

## 2022-06-16 LAB — PROTEIN / CREATININE RATIO, URINE
Creatinine, Urine: 61.07 mg/dL
Protein Creatinine Ratio: 2.78 mg/mg{Cre} — ABNORMAL HIGH (ref 0.00–0.15)
Total Protein, Urine: 170 mg/dL

## 2022-06-16 LAB — BPAM RBC
Blood Product Expiration Date: 202312122359
Blood Product Expiration Date: 202312132359
ISSUE DATE / TIME: 202311090023
ISSUE DATE / TIME: 202311090320
Unit Type and Rh: 5100
Unit Type and Rh: 5100

## 2022-06-16 LAB — NA AND K (SODIUM & POTASSIUM), RAND UR
Potassium Urine: 29 mmol/L
Sodium, Ur: 84 mmol/L

## 2022-06-16 LAB — CHLORIDE, URINE, RANDOM: Chloride Urine: 97 mmol/L

## 2022-06-16 LAB — ANA: Anti Nuclear Antibody (ANA): POSITIVE — AB

## 2022-06-16 LAB — GLOMERULAR BASEMENT MEMBRANE ANTIBODIES: GBM Ab: <0.2 units (ref 0.0–0.9)

## 2022-06-16 MED ORDER — METOLAZONE 5 MG PO TABS
5.0000 mg | ORAL_TABLET | Freq: Once | ORAL | Status: AC
  Administered 2022-06-16: 5 mg via ORAL
  Filled 2022-06-16: qty 1

## 2022-06-16 MED ORDER — FUROSEMIDE 10 MG/ML IJ SOLN
160.0000 mg | Freq: Four times a day (QID) | INTRAVENOUS | Status: DC
  Administered 2022-06-16 – 2022-06-19 (×13): 160 mg via INTRAVENOUS
  Filled 2022-06-16 (×18): qty 16

## 2022-06-16 NOTE — Plan of Care (Signed)

## 2022-06-16 NOTE — Progress Notes (Signed)
Subjective:  Had 1600 of UOP recorded but also had 2400 in.  Echo looks normal -  K remaining a little high -  hgb did not bump what you would expect with 2 unit transfusion -  BUN and crt staying stable.  She does not feel much better-  nor would I expect her to   Objective Vital signs in last 24 hours: Vitals:   06/15/22 1247 06/15/22 2035 06/15/22 2038 06/16/22 0546  BP: (!) 152/96 (!) 180/95 (!) 140/117 (!) 154/89  Pulse: 82 89 91 92  Resp: (!) 22 20  20   Temp: 98.1 F (36.7 C) 98 F (36.7 C)  98.5 F (36.9 C)  TempSrc: Oral Oral  Oral  SpO2: 97% 100%  94%  Weight:    (!) 161.2 kg  Height:       Weight change: 4.709 kg  Intake/Output Summary (Last 24 hours) at 06/16/2022 0732 Last data filed at 06/16/2022 0600 Gross per 24 hour  Intake 2318.19 ml  Output 1600 ml  Net 718.19 ml    Assessment/Plan: 36 year old WF with no past medical history and no medications-  presents with a crt of 4-  proteinuria and hematuria-  possible viral syndrome prodrome-  also anemia and hypoalbuminemia  1.Renal- dont have any recent kidney lab values.  So dont know if this change in renal function is acute or been happening for the last 6 years. She has proteinuria, hematuria  and hypoalbuminemia so suspicious for a GN.  Quantifying protein-  (2.7 grams)  and checking serologies (ANCA, ANA, antiGBM and also for post strep GN-  all pending).  renal ultrasound looks normal.   we will need to do a kidney biopsy to evaluate-  probably Monday-  will put the order in.  No empiric treatment yet as there is a possibility this has been happening for a while and there is not acute worsening the last 48 hours.  There are no absolute indications for dialysis at this time  2. Hypertension/volume  - is overloaded-  was given lasix but with moderate response, will inc and give dose of metolazone as well to try and make some headway 3. Anemia  - is severe and makes me think her CKD is not new-  has been transfused with  not a great bump in hgb -  will also give iron and ESA.  Her platelets are OK but will check haptoglobin to look for hemolysis as well  4. Potassium-  slightly high after blood-  suspect will come down If diuresis is successful-  not yet but is not dangerous-  increasing diuresis as above 5. Hypoalbuminemia-  could be due to nephrotic syndrome or just in the setting of recent viral illness and not eating.  Quantifying proteinuria will help determine which -  proteinuria not extreme at 2.7 grams-  maybe a combo of both    Patient will not be physically seen over the weekend but labs and UOP will be followed and changes made as needed-  call with questions     Louis Meckel    Labs: Basic Metabolic Panel: Recent Labs  Lab 06/14/22 1949 06/14/22 2119 06/15/22 0832 06/15/22 1154 06/15/22 1520 06/16/22 0340  NA 139  --  138  --   --  139  K 5.3*   < > 5.5* 5.3* 5.6* 5.3*  CL 110  --  110  --   --  111  CO2 20*  --  18*  --   --  18*  GLUCOSE 123*  --  132*  --   --  166*  BUN 88*  --  88*  --   --  84*  CREATININE 4.00*  --  4.07*  --   --  4.15*  CALCIUM 7.6*  --  7.1*  --   --  7.1*  PHOS  --   --   --   --   --  9.1*   < > = values in this interval not displayed.   Liver Function Tests: Recent Labs  Lab 06/14/22 2119 06/15/22 0832 06/16/22 0340  AST 16 12*  --   ALT 12 11  --   ALKPHOS 96 80  --   BILITOT 0.4 0.4  --   PROT 7.0 6.3*  --   ALBUMIN 3.0* 2.6* 2.7*   No results for input(s): "LIPASE", "AMYLASE" in the last 168 hours. No results for input(s): "AMMONIA" in the last 168 hours. CBC: Recent Labs  Lab 06/14/22 1949 06/15/22 0814 06/16/22 0340  WBC 13.5* 11.1* 11.6*  NEUTROABS  --  7.5  --   HGB 6.7* 7.0* 7.2*  HCT 23.6* 23.6* 24.7*  MCV 72.2* 73.3* 73.7*  PLT 339 271 281   Cardiac Enzymes: No results for input(s): "CKTOTAL", "CKMB", "CKMBINDEX", "TROPONINI" in the last 168 hours. CBG: No results for input(s): "GLUCAP" in the last 168  hours.  Iron Studies:  Recent Labs    06/15/22 0814  IRON 17*  TIBC 307  FERRITIN 20   Studies/Results: US RENAL  Result Date: 06/15/2022 CLINICAL DATA:  Acute kidney injury. EXAM: RENAL / URINARY TRACT ULTRASOUND COMPLETE COMPARISON:  CT abdomen pelvis 06/14/2022 FINDINGS: Right Kidney: Renal measurements: 13 x 5.6 x 6.8 cm = volume: 259 mL. Echogenicity within normal limits. No mass or hydronephrosis visualized. Left Kidney: Renal measurements: 12.4 x 5.0 x 6.7 cm = volume: 217 mL. Echogenicity within normal limits. No mass or hydronephrosis visualized. Bladder: Appears normal for degree of bladder distention. Other: Small left pleural effusion. IMPRESSION: 1. Normal renal ultrasound. 2. Small left pleural effusion Electronically Signed   By: Franchot Gallo M.D.   On: 06/15/2022 11:42   ECHOCARDIOGRAM COMPLETE  Result Date: 06/15/2022    ECHOCARDIOGRAM REPORT   Patient Name:   JAKYLAH BASSINGER Date of Exam: 06/15/2022 Medical Rec #:  [id number]      Height:       69.0 in Accession #:    [id number]     Weight:       345.0 lb Date of Birth:  05-Feb-1986      BSA:          2.605 m Patient Age:    36 years       BP:           125/80 mmHg Patient Gender: F              HR:           82 bpm. Exam Location:  [facility] Procedure: 2D Echo, Cardiac Doppler and Color Doppler Indications:    Dyspnea  History:        Patient has no prior history of Echocardiogram examinations.                 Risk Factors:Hypertension and Diabetes.  Sonographer:    Wenda Low Referring Phys: [id number] ASIA B [place]  Sonographer Comments: Patient is obese. IMPRESSIONS  1. Left ventricular ejection fraction, by estimation, is 60 to 65%. The left  ventricle has normal function. The left ventricle has no regional wall motion abnormalities. Left ventricular diastolic parameters were normal.  2. Right ventricular systolic function is normal. The right ventricular size is normal. There is mildly elevated pulmonary artery  systolic pressure.  3. The mitral valve is grossly normal. Trivial mitral valve regurgitation. No evidence of mitral stenosis.  4. The aortic valve is normal in structure. Aortic valve regurgitation is not visualized. No aortic stenosis is present.  5. The inferior vena cava is normal in size with greater than 50% respiratory variability, suggesting right atrial pressure of 3 mmHg. Comparison(s): No prior Echocardiogram. FINDINGS  Left Ventricle: Left ventricular ejection fraction, by estimation, is 60 to 65%. The left ventricle has normal function. The left ventricle has no regional wall motion abnormalities. The left ventricular internal cavity size was normal in size. There is  borderline left ventricular hypertrophy. Left ventricular diastolic parameters were normal. Right Ventricle: The right ventricular size is normal. No increase in right ventricular wall thickness. Right ventricular systolic function is normal. There is mildly elevated pulmonary artery systolic pressure. The tricuspid regurgitant velocity is 2.88  m/s, and with an assumed right atrial pressure of 3 mmHg, the estimated right ventricular systolic pressure is 73.4 mmHg. Left Atrium: Left atrial size was normal in size. Right Atrium: Right atrial size was normal in size. Pericardium: There is no evidence of pericardial effusion. Mitral Valve: The mitral valve is grossly normal. Trivial mitral valve regurgitation. No evidence of mitral valve stenosis. MV peak gradient, 7.5 mmHg. The mean mitral valve gradient is 4.0 mmHg. Tricuspid Valve: The tricuspid valve is grossly normal. Tricuspid valve regurgitation is trivial. No evidence of tricuspid stenosis. Aortic Valve: The aortic valve is normal in structure. Aortic valve regurgitation is not visualized. No aortic stenosis is present. Aortic valve mean gradient measures 4.0 mmHg. Aortic valve peak gradient measures 8.2 mmHg. Aortic valve area, by VTI measures 2.31 cm. Pulmonic Valve: The pulmonic  valve was not well visualized. Pulmonic valve regurgitation is not visualized. No evidence of pulmonic stenosis. Aorta: The aortic root is normal in size and structure. Venous: The inferior vena cava is normal in size with greater than 50% respiratory variability, suggesting right atrial pressure of 3 mmHg. IAS/Shunts: No atrial level shunt detected by color flow Doppler.  LEFT VENTRICLE PLAX 2D LVIDd:         5.20 cm   Diastology LVIDs:         3.40 cm   LV e' medial:    14.40 cm/s LV PW:         1.20 cm   LV E/e' medial:  9.6 LV IVS:        1.20 cm   LV e' lateral:   13.40 cm/s LVOT diam:     2.00 cm   LV E/e' lateral: 10.3 LV SV:         71 LV SV Index:   27 LVOT Area:     3.14 cm  RIGHT VENTRICLE RV Basal diam:  3.40 cm RV Mid diam:    3.00 cm RV S prime:     16.30 cm/s TAPSE (M-mode): 3.2 cm LEFT ATRIUM             Index        RIGHT ATRIUM           Index LA diam:        4.60 cm 1.77 cm/m   RA Area:     21.30 cm  LA Vol (A2C):   85.0 ml 32.63 ml/m  RA Volume:   69.10 ml  26.53 ml/m LA Vol (A4C):   70.8 ml 27.18 ml/m LA Biplane Vol: 83.8 ml 32.17 ml/m  AORTIC VALVE                    PULMONIC VALVE AV Area (Vmax):    2.26 cm     PV Vmax:       1.08 m/s AV Area (Vmean):   2.18 cm     PV Peak grad:  4.7 mmHg AV Area (VTI):     2.31 cm AV Vmax:           143.00 cm/s AV Vmean:          97.800 cm/s AV VTI:            0.308 m AV Peak Grad:      8.2 mmHg AV Mean Grad:      4.0 mmHg LVOT Vmax:         103.00 cm/s LVOT Vmean:        68.000 cm/s LVOT VTI:          0.226 m LVOT/AV VTI ratio: 0.73  AORTA Ao Root diam: 3.30 cm MITRAL VALVE                TRICUSPID VALVE MV Area (PHT): 4.04 cm     TR Peak grad:   33.2 mmHg MV Area VTI:   2.09 cm     TR Vmax:        288.00 cm/s MV Peak grad:  7.5 mmHg MV Mean grad:  4.0 mmHg     SHUNTS MV Vmax:       1.37 m/s     Systemic VTI:  0.23 m MV Vmean:      91.7 cm/s    Systemic Diam: 2.00 cm MV Decel Time: 188 msec MV E velocity: 138.00 cm/s MV A velocity: 91.70 cm/s MV  E/A ratio:  1.50 Vishnu Priya Mallipeddi Electronically signed by Lorelee Cover Mallipeddi Signature Date/Time: 06/15/2022/10:53:48 AM    Final    DG Chest Port 1 View  Result Date: 06/15/2022 CLINICAL DATA:  Bilateral leg swelling.  Chest pain. EXAM: PORTABLE CHEST 1 VIEW COMPARISON:  06/14/2022 FINDINGS: 0516 hours. Low volume film. The cardio pericardial silhouette is enlarged. There is pulmonary vascular congestion without overt pulmonary edema. Bibasilar atelectasis/infiltrate noted, left greater than right with small bilateral pleural effusions, similar to prior. Telemetry leads overlie the chest. IMPRESSION: Low volume film with bibasilar atelectasis/infiltrate and small bilateral pleural effusions, left greater than right. Electronically Signed   By: Misty Stanley M.D.   On: 06/15/2022 05:39   CT Renal Stone Study  Result Date: 06/14/2022 CLINICAL DATA:  Swelling low hemoglobin elevated creatinine EXAM: CT ABDOMEN AND PELVIS WITHOUT CONTRAST TECHNIQUE: Multidetector CT imaging of the abdomen and pelvis was performed following the standard protocol without IV contrast. RADIATION DOSE REDUCTION: This exam was performed according to the departmental dose-optimization program which includes automated exposure control, adjustment of the mA and/or kV according to patient size and/or use of iterative reconstruction technique. COMPARISON:  CT 08/08/2011 FINDINGS: Lower chest: Lung bases demonstrate small bilateral pleural effusions. Probable passive atelectasis in the left lower lobe. Upper normal cardiac size. Hepatobiliary: No focal liver abnormality is seen. No gallstones, gallbladder wall thickening, or biliary dilatation. Pancreas: Unremarkable. No pancreatic ductal dilatation or surrounding inflammatory changes. Spleen: Enlarged up to 16.3 cm. Adrenals/Urinary Tract: Adrenal  glands are unremarkable. Kidneys are normal, without renal calculi, focal lesion, or hydronephrosis. Bladder is unremarkable.  Stomach/Bowel: Stomach is within normal limits. Appendix appears normal. No evidence of bowel wall thickening, distention, or inflammatory changes. Vascular/Lymphatic: No significant vascular findings are present. No enlarged abdominal or pelvic lymph nodes. Reproductive: Uterus and bilateral adnexa are unremarkable. Other: Negative for pelvic effusion or free air. Small fat containing umbilical hernia. Edema within the subcutaneous soft tissues of the abdominal wall. Musculoskeletal: No acute osseous abnormality. IMPRESSION: 1. No CT evidence for acute intra-abdominal or pelvic abnormality. 2. Small bilateral pleural effusions. Mild diffuse subcutaneous edema. 3. Spleen appears slightly enlarged Electronically Signed   By: Donavan Foil M.D.   On: 06/14/2022 22:14   DG Chest 2 View  Result Date: 06/14/2022 CLINICAL DATA:  Chest pain EXAM: CHEST - 2 VIEW COMPARISON:  09/23/2018 FINDINGS: Small bilateral pleural effusions. Diffuse increased interstitial opacity could reflect low-grade edema. Patchy airspace opacity at the bases. Normal cardiac size. No pneumothorax IMPRESSION: Small bilateral pleural effusions with patchy airspace disease at the bases which may be due to atelectasis or pneumonia. Diffuse increased interstitial opacity could reflect low-grade edema. Electronically Signed   By: Donavan Foil M.D.   On: 06/14/2022 19:59   Medications: Infusions:  cefTRIAXone (ROCEPHIN)  IV 1 g (06/16/22 0407)   ferric gluconate (FERRLECIT) IVPB 250 mg (06/15/22 1346)   furosemide 160 mg (06/15/22 2241)    Scheduled Medications:  [START ON 06/19/2022] ferrous sulfate  325 mg Oral BID WC    have reviewed scheduled and prn medications.  Physical Exam: General:  really NAD but does not feel well  Heart: RRR Lungs: dec BS at bases Abdomen: obese, non tender Extremities: pitting edema     06/16/2022,7:32 AM  LOS: 2 days

## 2022-06-16 NOTE — Progress Notes (Signed)
PROGRESS NOTE    [name]  YQM:[id number] DOB: January 17, 1986 DOA: 06/14/2022 PCP: Patient, No Pcp Per    Brief Narrative:  36 year old female, admitted to the hospital with dyspnea on exertion.  Found to have elevated creatinine of 4.0.  She is noted to have proteinuria.  She was also anemic with a hemoglobin of 6.7.  She was transfused PRBC.  Nephrology following.  She is undergoing further work-up for renal failure as well as attempted diuresis.  Plans are for renal biopsy on 11/13.   Assessment & Plan:   Principal Problem:   Symptomatic anemia Active Problems:   AKI (acute kidney injury) (HCC)   Hyperkalemia   HTN (hypertension)   UTI (urinary tract infection)   Peripheral edema   Symptomatic iron deficiency anemia -Possibly related to blood loss from patient menses -Hemoglobin was down to 6.7 on admission -She has received 1 unit of PRBC, second unit was discontinued due to development of itching -She also received iron infusion as well as Aranesp -Follow-up hemoglobin has been stable at 7.2  Possible AKI -No prior labs for comparison -Creatinine currently elevated at 4 -Nephrology following, appreciate input -Renal ultrasound shows normal-sized kidneys -Further work-up including ANCA,  ASO titers, complement levels have been ordered. -Sed rate 68 -Anti-GBM antibody negative -ANA positive, will check reflex panel -Urine protein is elevated -Plans for renal biopsy on 11/13  Hypokalemia -Hopefully, this should trend down with Lasix  Hypoalbuminemia -Although she does have proteinuria, does not appear to be nephrotic range  Volume overload -Increasing intravenous Lasix dose and adding metolazone -Monitor urine output -Echocardiogram shows normal ejection fraction  Possible UTI -Empirically started on ceftriaxone -Urine culture without any significant growth -We will discontinue further antibiotics  Hypertension -Continue to trend blood pressures with  diuresis -She is not on any chronic meds  Obesity, class III -BMI 52.48 -Continue to monitor weight with diuresis   DVT prophylaxis: SCDs Start: 06/15/22 0059  Code Status: full code Family Communication: discussed with husband at bedside Disposition Plan: Status is: Inpatient Remains inpatient appropriate because: Further management of volume overload and work-up of renal failure     Consultants:  Nephrology  Procedures:    Antimicrobials:  Ceftriaxone   Subjective: She says she feels about the same.  She still becomes short of breath/orthopneic when laying down.  Still gets dyspnea on exertion  Objective: Vitals:   06/16/22 0546 06/16/22 1204 06/16/22 1527 06/16/22 2008  BP: (!) 154/89 (!) 161/97 139/75 (!) 159/81  Pulse: 92 85 90 85  Resp: 20 19 19 18   Temp: 98.5 F (36.9 C) 98.1 F (36.7 C) 98.5 F (36.9 C) 98.1 F (36.7 C)  TempSrc: Oral Oral Oral Oral  SpO2: 94% 96% 97% 97%  Weight: (!) 161.2 kg     Height:        Intake/Output Summary (Last 24 hours) at 06/16/2022 2108 Last data filed at 06/16/2022 1805 Gross per 24 hour  Intake 1560.19 ml  Output 1300 ml  Net 260.19 ml   Filed Weights   06/14/22 1924 06/16/22 0546  Weight: (!) 156.5 kg (!) 161.2 kg    Examination:  General exam: Appears calm and comfortable  Respiratory system: Clear to auscultation. Respiratory effort normal. Cardiovascular system: S1 & S2 heard, RRR. No JVD, murmurs, rubs, gallops or clicks.  Gastrointestinal system: Abdomen is nondistended, soft and nontender. No organomegaly or masses felt. Normal bowel sounds heard. Central nervous system: Alert and oriented. No focal neurological deficits. Extremities: Significant edema  lower extremities Skin: No rashes, lesions or ulcers Psychiatry: Judgement and insight appear normal. Mood & affect appropriate.     Data Reviewed: I have personally reviewed following labs and imaging studies  CBC: Recent Labs  Lab  06/14/22 1949 06/15/22 0814 06/16/22 0340  WBC 13.5* 11.1* 11.6*  NEUTROABS  --  7.5  --   HGB 6.7* 7.0* 7.2*  HCT 23.6* 23.6* 24.7*  MCV 72.2* 73.3* 73.7*  PLT 339 271 086   Basic Metabolic Panel: Recent Labs  Lab 06/14/22 1949 06/14/22 2119 06/15/22 0832 06/15/22 1154 06/15/22 1520 06/16/22 0340  NA 139  --  138  --   --  139  K 5.3* 5.7* 5.5* 5.3* 5.6* 5.3*  CL 110  --  110  --   --  111  CO2 20*  --  18*  --   --  18*  GLUCOSE 123*  --  132*  --   --  166*  BUN 88*  --  88*  --   --  84*  CREATININE 4.00*  --  4.07*  --   --  4.15*  CALCIUM 7.6*  --  7.1*  --   --  7.1*  MG  --   --  2.6*  --   --   --   PHOS  --   --   --   --   --  9.1*   GFR: Estimated Creatinine Clearance: 30.8 mL/min (A) (by C-G formula based on SCr of 4.15 mg/dL (H)). Liver Function Tests: Recent Labs  Lab 06/14/22 2119 06/15/22 0832 06/16/22 0340  AST 16 12*  --   ALT 12 11  --   ALKPHOS 96 80  --   BILITOT 0.4 0.4  --   PROT 7.0 6.3*  --   ALBUMIN 3.0* 2.6* 2.7*   No results for input(s): "LIPASE", "AMYLASE" in the last 168 hours. No results for input(s): "AMMONIA" in the last 168 hours. Coagulation Profile: No results for input(s): "INR", "PROTIME" in the last 168 hours. Cardiac Enzymes: No results for input(s): "CKTOTAL", "CKMB", "CKMBINDEX", "TROPONINI" in the last 168 hours. BNP (last 3 results) No results for input(s): "PROBNP" in the last 8760 hours. HbA1C: No results for input(s): "HGBA1C" in the last 72 hours. CBG: No results for input(s): "GLUCAP" in the last 168 hours. Lipid Profile: No results for input(s): "CHOL", "HDL", "LDLCALC", "TRIG", "CHOLHDL", "LDLDIRECT" in the last 72 hours. Thyroid Function Tests: Recent Labs    06/15/22 0814  TSH 3.181   Anemia Panel: Recent Labs    06/15/22 0814  VITAMINB12 378  FOLATE 9.6  FERRITIN 20  TIBC 307  IRON 17*  RETICCTPCT 1.4   Sepsis Labs: No results for input(s): "PROCALCITON", "LATICACIDVEN" in the last  168 hours.  Recent Results (from the past 240 hour(s))  Urine Culture     Status: Abnormal   Collection Time: 06/15/22  1:20 AM   Specimen: Urine, Clean Catch  Result Value Ref Range Status   Specimen Description   Final    URINE, CLEAN CATCH Performed at [facility], [address], [place], [place] [address]    Special Requests   Final    NONE Performed at [facility], [address], [place], [place] [address]    Culture (A)  Final    10,000 COLONIES/mL GROUP B STREP(S.AGALACTIAE)ISOLATED TESTING AGAINST S. AGALACTIAE NOT ROUTINELY PERFORMED DUE TO PREDICTABILITY OF AMP/PEN/VAN SUSCEPTIBILITY. Performed at [facility], [address] [place],  [place] [address]    Report Status 06/16/2022 FINAL  Final         Radiology Studies: US RENAL  Result Date: 06/15/2022 CLINICAL DATA:  Acute kidney injury. EXAM: RENAL / URINARY TRACT ULTRASOUND COMPLETE COMPARISON:  CT abdomen pelvis 06/14/2022 FINDINGS: Right Kidney: Renal measurements: 13 x 5.6 x 6.8 cm = volume: 259 mL. Echogenicity within normal limits. No mass or hydronephrosis visualized. Left Kidney: Renal measurements: 12.4 x 5.0 x 6.7 cm = volume: 217 mL. Echogenicity within normal limits. No mass or hydronephrosis visualized. Bladder: Appears normal for degree of bladder distention. Other: Small left pleural effusion. IMPRESSION: 1. Normal renal ultrasound. 2. Small left pleural effusion Electronically Signed   By: Franchot Gallo M.D.   On: 06/15/2022 11:42   ECHOCARDIOGRAM COMPLETE  Result Date: 06/15/2022    ECHOCARDIOGRAM REPORT   Patient Name:   SAWYER KAHAN Date of Exam: 06/15/2022 Medical Rec #:  [id number]      Height:       69.0 in Accession #:    [id number]     Weight:       345.0 lb Date of Birth:  1986-03-08      BSA:          2.605 m Patient Age:    68 years       BP:           125/80 mmHg Patient Gender: F              HR:           82 bpm. Exam Location:  [facility] Procedure: 2D Echo, Cardiac Doppler and  Color Doppler Indications:    Dyspnea  History:        Patient has no prior history of Echocardiogram examinations.                 Risk Factors:Hypertension and Diabetes.  Sonographer:    Wenda Low Referring Phys: [id number] ASIA B Oliver  Sonographer Comments: Patient is obese. IMPRESSIONS  1. Left ventricular ejection fraction, by estimation, is 60 to 65%. The left ventricle has normal function. The left ventricle has no regional wall motion abnormalities. Left ventricular diastolic parameters were normal.  2. Right ventricular systolic function is normal. The right ventricular size is normal. There is mildly elevated pulmonary artery systolic pressure.  3. The mitral valve is grossly normal. Trivial mitral valve regurgitation. No evidence of mitral stenosis.  4. The aortic valve is normal in structure. Aortic valve regurgitation is not visualized. No aortic stenosis is present.  5. The inferior vena cava is normal in size with greater than 50% respiratory variability, suggesting right atrial pressure of 3 mmHg. Comparison(s): No prior Echocardiogram. FINDINGS  Left Ventricle: Left ventricular ejection fraction, by estimation, is 60 to 65%. The left ventricle has normal function. The left ventricle has no regional wall motion abnormalities. The left ventricular internal cavity size was normal in size. There is  borderline left ventricular hypertrophy. Left ventricular diastolic parameters were normal. Right Ventricle: The right ventricular size is normal. No increase in right ventricular wall thickness. Right ventricular systolic function is normal. There is mildly elevated pulmonary artery systolic pressure. The tricuspid regurgitant velocity is 2.88  m/s, and with an assumed right atrial pressure of 3 mmHg, the estimated right ventricular systolic pressure is 73.5 mmHg. Left Atrium: Left atrial size was normal in size. Right Atrium: Right atrial size was normal in size. Pericardium: There is no  evidence of pericardial  effusion. Mitral Valve: The mitral valve is grossly normal. Trivial mitral valve regurgitation. No evidence of mitral valve stenosis. MV peak gradient, 7.5 mmHg. The mean mitral valve gradient is 4.0 mmHg. Tricuspid Valve: The tricuspid valve is grossly normal. Tricuspid valve regurgitation is trivial. No evidence of tricuspid stenosis. Aortic Valve: The aortic valve is normal in structure. Aortic valve regurgitation is not visualized. No aortic stenosis is present. Aortic valve mean gradient measures 4.0 mmHg. Aortic valve peak gradient measures 8.2 mmHg. Aortic valve area, by VTI measures 2.31 cm. Pulmonic Valve: The pulmonic valve was not well visualized. Pulmonic valve regurgitation is not visualized. No evidence of pulmonic stenosis. Aorta: The aortic root is normal in size and structure. Venous: The inferior vena cava is normal in size with greater than 50% respiratory variability, suggesting right atrial pressure of 3 mmHg. IAS/Shunts: No atrial level shunt detected by color flow Doppler.  LEFT VENTRICLE PLAX 2D LVIDd:         5.20 cm   Diastology LVIDs:         3.40 cm   LV e' medial:    14.40 cm/s LV PW:         1.20 cm   LV E/e' medial:  9.6 LV IVS:        1.20 cm   LV e' lateral:   13.40 cm/s LVOT diam:     2.00 cm   LV E/e' lateral: 10.3 LV SV:         71 LV SV Index:   27 LVOT Area:     3.14 cm  RIGHT VENTRICLE RV Basal diam:  3.40 cm RV Mid diam:    3.00 cm RV S prime:     16.30 cm/s TAPSE (M-mode): 3.2 cm LEFT ATRIUM             Index        RIGHT ATRIUM           Index LA diam:        4.60 cm 1.77 cm/m   RA Area:     21.30 cm LA Vol (A2C):   85.0 ml 32.63 ml/m  RA Volume:   69.10 ml  26.53 ml/m LA Vol (A4C):   70.8 ml 27.18 ml/m LA Biplane Vol: 83.8 ml 32.17 ml/m  AORTIC VALVE                    PULMONIC VALVE AV Area (Vmax):    2.26 cm     PV Vmax:       1.08 m/s AV Area (Vmean):   2.18 cm     PV Peak grad:  4.7 mmHg AV Area (VTI):     2.31 cm AV Vmax:            143.00 cm/s AV Vmean:          97.800 cm/s AV VTI:            0.308 m AV Peak Grad:      8.2 mmHg AV Mean Grad:      4.0 mmHg LVOT Vmax:         103.00 cm/s LVOT Vmean:        68.000 cm/s LVOT VTI:          0.226 m LVOT/AV VTI ratio: 0.73  AORTA Ao Root diam: 3.30 cm MITRAL VALVE                TRICUSPID VALVE MV Area (PHT): 4.04  cm     TR Peak grad:   33.2 mmHg MV Area VTI:   2.09 cm     TR Vmax:        288.00 cm/s MV Peak grad:  7.5 mmHg MV Mean grad:  4.0 mmHg     SHUNTS MV Vmax:       1.37 m/s     Systemic VTI:  0.23 m MV Vmean:      91.7 cm/s    Systemic Diam: 2.00 cm MV Decel Time: 188 msec MV E velocity: 138.00 cm/s MV A velocity: 91.70 cm/s MV E/A ratio:  1.50 Vishnu Priya Mallipeddi Electronically signed by Lorelee Cover Mallipeddi Signature Date/Time: 06/15/2022/10:53:48 AM    Final    DG Chest Port 1 View  Result Date: 06/15/2022 CLINICAL DATA:  Bilateral leg swelling.  Chest pain. EXAM: PORTABLE CHEST 1 VIEW COMPARISON:  06/14/2022 FINDINGS: 0516 hours. Low volume film. The cardio pericardial silhouette is enlarged. There is pulmonary vascular congestion without overt pulmonary edema. Bibasilar atelectasis/infiltrate noted, left greater than right with small bilateral pleural effusions, similar to prior. Telemetry leads overlie the chest. IMPRESSION: Low volume film with bibasilar atelectasis/infiltrate and small bilateral pleural effusions, left greater than right. Electronically Signed   By: Misty Stanley M.D.   On: 06/15/2022 05:39   CT Renal Stone Study  Result Date: 06/14/2022 CLINICAL DATA:  Swelling low hemoglobin elevated creatinine EXAM: CT ABDOMEN AND PELVIS WITHOUT CONTRAST TECHNIQUE: Multidetector CT imaging of the abdomen and pelvis was performed following the standard protocol without IV contrast. RADIATION DOSE REDUCTION: This exam was performed according to the departmental dose-optimization program which includes automated exposure control, adjustment of the mA and/or kV  according to patient size and/or use of iterative reconstruction technique. COMPARISON:  CT 08/08/2011 FINDINGS: Lower chest: Lung bases demonstrate small bilateral pleural effusions. Probable passive atelectasis in the left lower lobe. Upper normal cardiac size. Hepatobiliary: No focal liver abnormality is seen. No gallstones, gallbladder wall thickening, or biliary dilatation. Pancreas: Unremarkable. No pancreatic ductal dilatation or surrounding inflammatory changes. Spleen: Enlarged up to 16.3 cm. Adrenals/Urinary Tract: Adrenal glands are unremarkable. Kidneys are normal, without renal calculi, focal lesion, or hydronephrosis. Bladder is unremarkable. Stomach/Bowel: Stomach is within normal limits. Appendix appears normal. No evidence of bowel wall thickening, distention, or inflammatory changes. Vascular/Lymphatic: No significant vascular findings are present. No enlarged abdominal or pelvic lymph nodes. Reproductive: Uterus and bilateral adnexa are unremarkable. Other: Negative for pelvic effusion or free air. Small fat containing umbilical hernia. Edema within the subcutaneous soft tissues of the abdominal wall. Musculoskeletal: No acute osseous abnormality. IMPRESSION: 1. No CT evidence for acute intra-abdominal or pelvic abnormality. 2. Small bilateral pleural effusions. Mild diffuse subcutaneous edema. 3. Spleen appears slightly enlarged Electronically Signed   By: Donavan Foil M.D.   On: 06/14/2022 22:14        Scheduled Meds:  [START ON 06/19/2022] ferrous sulfate  325 mg Oral BID WC   Continuous Infusions:  ferric gluconate (FERRLECIT) IVPB 250 mg (06/16/22 1016)   furosemide 160 mg (06/16/22 2018)     LOS: 2 days    Time spent: 38mins    Kathie Dike, MD [facility]   If 7PM-7AM, please contact night-coverage [email or web address]  06/16/2022, 9:08 PM

## 2022-06-16 NOTE — Progress Notes (Signed)
  Transition of Care (TOC) Screening Note   Patient Details  Name: [name] Date of Birth: 21-Oct-1985   Transition of Care [facility]) CM/SW Contact:    Ihor Gully, LCSW Phone Number: 06/16/2022, 9:35 AM    Transition of Care Department [facility]) has reviewed patient and no TOC needs have been identified at this time. We will continue to monitor patient advancement through interdisciplinary progression rounds. If new patient transition needs arise, please place a TOC consult.

## 2022-06-16 NOTE — Progress Notes (Addendum)
Request received for random renal bx.   Patient is scheduled for the bx on Monday AM at [facility].  Asked MD/RN at [facility] to arrange [facility] so that patient can be at [facility] by 8-8:30 am on Monday.   Plan - NPO Monday MN  - INR/CBC in [place] AM  - Please refrain from AC/AP till bx is done - if DVT prophylaxis is needed, Lovenox needs 24 hr hold and sq heparin needs 6 hr hold  - Formal consult to follow   [place] PA-C 06/16/2022 10:40 AM

## 2022-06-17 DIAGNOSIS — N390 Urinary tract infection, site not specified: Secondary | ICD-10-CM | POA: Diagnosis not present

## 2022-06-17 DIAGNOSIS — N179 Acute kidney failure, unspecified: Secondary | ICD-10-CM | POA: Diagnosis not present

## 2022-06-17 DIAGNOSIS — D649 Anemia, unspecified: Secondary | ICD-10-CM | POA: Diagnosis not present

## 2022-06-17 DIAGNOSIS — I10 Essential (primary) hypertension: Secondary | ICD-10-CM | POA: Diagnosis not present

## 2022-06-17 LAB — CBC
HCT: 26.3 % — ABNORMAL LOW (ref 36.0–46.0)
Hemoglobin: 7.7 g/dL — ABNORMAL LOW (ref 12.0–15.0)
MCH: 21.6 pg — ABNORMAL LOW (ref 26.0–34.0)
MCHC: 29.3 g/dL — ABNORMAL LOW (ref 30.0–36.0)
MCV: 73.7 fL — ABNORMAL LOW (ref 80.0–100.0)
Platelets: 295 10*3/uL (ref 150–400)
RBC: 3.57 MIL/uL — ABNORMAL LOW (ref 3.87–5.11)
RDW: 18.6 % — ABNORMAL HIGH (ref 11.5–15.5)
WBC: 11.7 10*3/uL — ABNORMAL HIGH (ref 4.0–10.5)
nRBC: 0 % (ref 0.0–0.2)

## 2022-06-17 LAB — RENAL FUNCTION PANEL
Albumin: 2.9 g/dL — ABNORMAL LOW (ref 3.5–5.0)
Anion gap: 12 (ref 5–15)
BUN: 79 mg/dL — ABNORMAL HIGH (ref 6–20)
CO2: 21 mmol/L — ABNORMAL LOW (ref 22–32)
Calcium: 7.3 mg/dL — ABNORMAL LOW (ref 8.9–10.3)
Chloride: 108 mmol/L (ref 98–111)
Creatinine, Ser: 3.9 mg/dL — ABNORMAL HIGH (ref 0.44–1.00)
GFR, Estimated: 15 mL/min — ABNORMAL LOW (ref >60)
Glucose, Bld: 106 mg/dL — ABNORMAL HIGH (ref 70–99)
Phosphorus: 9.4 mg/dL — ABNORMAL HIGH (ref 2.5–4.6)
Potassium: 4.7 mmol/L (ref 3.5–5.1)
Sodium: 141 mmol/L (ref 135–145)

## 2022-06-17 LAB — HAPTOGLOBIN: Haptoglobin: 259 mg/dL (ref 33–278)

## 2022-06-17 LAB — ANTISTREPTOLYSIN O TITER: ASO: 1459 IU/mL — ABNORMAL HIGH (ref 0.0–200.0)

## 2022-06-17 MED ORDER — FERRIC CITRATE 1 GM 210 MG(FE) PO TABS
420.0000 mg | ORAL_TABLET | Freq: Three times a day (TID) | ORAL | Status: DC
  Administered 2022-06-17 – 2022-06-24 (×16): 420 mg via ORAL
  Filled 2022-06-17 (×27): qty 2

## 2022-06-17 MED ORDER — METOLAZONE 5 MG PO TABS
5.0000 mg | ORAL_TABLET | Freq: Once | ORAL | Status: AC
  Administered 2022-06-17: 5 mg via ORAL
  Filled 2022-06-17: qty 1

## 2022-06-17 NOTE — Progress Notes (Signed)
Subjective:  Had 1700 of UOP recorded 720 in.  Echo looks normal -  K better at 4.7 -  hgb remains at 7.7.   Objective Vital signs in last 24 hours: Vitals:   06/16/22 1527 06/16/22 2008 06/17/22 0559 06/17/22 1354  BP: 139/75 (!) 159/81 (!) 162/87 (!) 180/94  Pulse: 90 85 85 78  Resp: 19 18 (!) 24   Temp: 98.5 F (36.9 C) 98.1 F (36.7 C) 98.2 F (36.8 C) 97.6 F (36.4 C)  TempSrc: Oral Oral Oral   SpO2: 97% 97% 95% 96%  Weight:   (!) 156.8 kg   Height:       Weight change: -4.437 kg  Intake/Output Summary (Last 24 hours) at 06/17/2022 1456 Last data filed at 06/17/2022 0500 Gross per 24 hour  Intake 240 ml  Output 1100 ml  Net -860 ml    Assessment/Plan: 36 year old WF with no past medical history and no medications-  presents with a crt of 4-  proteinuria and hematuria-  possible viral syndrome prodrome-  also anemia and hypoalbuminemia  1.Renal- dont have any recent kidney lab values.  So dont know if this change in renal function is acute or been happening for the last 6 years. NL function in 2017; she has proteinuria, hematuria  and hypoalbuminemia so suspicious for a GN.  Quantifying protein-  (2.7 grams)  and checking serologies (ANCA pending; GBM neg; ANA and ASO positive); ANA titer in process and will request a DSDNA. Renal ultrasound looks normal w/ no e/o obstruction.   we will need to do a kidney biopsy to evaluate-  probably Monday-  order in.  No empiric treatment yet as there is a possibility this has been happening for a while. No absolute indications for dialysis at this time  2. Hypertension/volume  - is overloaded-  was given lasix but with moderate response, but better response with  incr dose + metolazone. 3. Anemia  - is severe and makes me think her CKD is not new-  has been transfused with not a great bump in hgb -  Ferrlecit 3/4 250mg  given; ESA.  Her platelets are OK; haptoglobin NL.  4. Renal osteodystrophy Will start Auryxia 2 tabs TIDM; she prefers not  to have anything that dissolves in the mouth. We will adjust and change as tolerated. 5. Potassium-  slightly high after blood-  suspect will come down If diuresis is successful-  not yet but is not dangerous-  increasing diuresis as above 6. Hypoalbuminemia-  could be due to nephrotic syndrome or just in the setting of recent viral illness and not eating.  Quantifying proteinuria will help determine which -  proteinuria not extreme at 2.7 grams-  maybe a combo of both     [name]    Labs: Basic Metabolic Panel: Recent Labs  Lab 06/15/22 0832 06/15/22 1154 06/15/22 1520 06/16/22 0340 06/17/22 0548  NA 138  --   --  139 141  K 5.5*   < > 5.6* 5.3* 4.7  CL 110  --   --  111 108  CO2 18*  --   --  18* 21*  GLUCOSE 132*  --   --  166* 106*  BUN 88*  --   --  84* 79*  CREATININE 4.07*  --   --  4.15* 3.90*  CALCIUM 7.1*  --   --  7.1* 7.3*  PHOS  --   --   --  9.1* 9.4*   < > =  values in this interval not displayed.   Liver Function Tests: Recent Labs  Lab 06/14/22 2119 06/15/22 0832 06/16/22 0340 06/17/22 0548  AST 16 12*  --   --   ALT 12 11  --   --   ALKPHOS 96 80  --   --   BILITOT 0.4 0.4  --   --   PROT 7.0 6.3*  --   --   ALBUMIN 3.0* 2.6* 2.7* 2.9*   No results for input(s): "LIPASE", "AMYLASE" in the last 168 hours. No results for input(s): "AMMONIA" in the last 168 hours. CBC: Recent Labs  Lab 06/14/22 1949 06/15/22 0814 06/16/22 0340 06/17/22 0548  WBC 13.5* 11.1* 11.6* 11.7*  NEUTROABS  --  7.5  --   --   HGB 6.7* 7.0* 7.2* 7.7*  HCT 23.6* 23.6* 24.7* 26.3*  MCV 72.2* 73.3* 73.7* 73.7*  PLT 339 271 281 295   Cardiac Enzymes: No results for input(s): "CKTOTAL", "CKMB", "CKMBINDEX", "TROPONINI" in the last 168 hours. CBG: No results for input(s): "GLUCAP" in the last 168 hours.  Iron Studies:  Recent Labs    06/15/22 0814  IRON 17*  TIBC 307  FERRITIN 20   Studies/Results: No results found. Medications: Infusions:  ferric gluconate  (FERRLECIT) IVPB 250 mg (06/17/22 1048)   furosemide 160 mg (06/17/22 1340)    Scheduled Medications:  [START ON 06/19/2022] ferrous sulfate  325 mg Oral BID WC    have reviewed scheduled and prn medications.  Physical Exam: General:  NAD but does not feel well  Heart: RRR Lungs: dec BS at bases Abdomen: obese, non tender Extremities: pitting edema     06/17/2022,2:56 PM  LOS: 3 days

## 2022-06-17 NOTE — Progress Notes (Signed)
Ferric citrate not available in patient drawer or pyxis awaiting AC to bring medication to be given to patient. Charge aware.

## 2022-06-17 NOTE — Progress Notes (Signed)
PROGRESS NOTE    Christine HALTIWANGER  JSE:[id number] DOB: [birth year] DOA: 06/14/2022 PCP: Patient, No Pcp Per    Brief Narrative:  36 year old female, admitted to the hospital with dyspnea on exertion.  Found to have elevated creatinine of 4.0.  She is noted to have proteinuria.  She was also anemic with a hemoglobin of 6.7.  She was transfused PRBC.  Nephrology following.  She is undergoing further work-up for renal failure as well as attempted diuresis.  Plans are for renal biopsy on 11/13.   Assessment & Plan:   Principal Problem:   Symptomatic anemia Active Problems:   AKI (acute kidney injury) (HCC)   Hyperkalemia   HTN (hypertension)   UTI (urinary tract infection)   Peripheral edema   Symptomatic iron deficiency anemia -Possibly related to blood loss from patient menses -Hemoglobin was down to 6.7 on admission -She has received 1 unit of PRBC, second unit was discontinued due to development of itching -She also received iron infusion as well as Aranesp -Follow-up hemoglobin has been stable at 7.7 -Haptoglobin is not significantly low  Possible AKI -No prior labs for comparison -Creatinine currently elevated at 4 -Nephrology following, appreciate input -Renal ultrasound shows normal-sized kidneys -Further work-up including ANCA, complement levels, anti-double-stranded DNA antibody have been ordered. 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  Feels that dyspnea is a little better today.  She is feeling nauseous today  Objective: Vitals:   06/16/22 0546 06/16/22 1204 06/16/22 1527 06/16/22 2008  BP: (!) 154/89 (!) 161/97 139/75 (!) 159/81  Pulse: 92 85 90 85  Resp: 20 19 19 18   Temp: 98.5 F (36.9 C) 98.1 F (36.7 C) 98.5 F (36.9 C) 98.1 F (36.7 C)  TempSrc: Oral Oral Oral Oral  SpO2: 94% 96% 97% 97%  Weight: (!) 161.2 kg     Height:        Intake/Output Summary (Last 24 hours) at 06/16/2022 2108 Last data filed at 06/16/2022 1805 Gross per 24 hour  Intake 1560.19 ml  Output 1300 ml  Net 260.19 ml   Filed Weights   06/14/22 1924 06/16/22 0546  Weight: (!) 156.5 kg (!) 161.2 kg    Examination:  General exam: Appears calm and comfortable  Respiratory system: Clear to auscultation. Respiratory effort normal. Cardiovascular system: S1 & S2 heard, RRR. No JVD, murmurs, rubs, gallops or clicks.  Gastrointestinal system: Abdomen is nondistended, soft and nontender. No organomegaly or masses felt. Normal bowel sounds heard. Central nervous system: Alert and oriented. No focal neurological deficits. Extremities: Significant edema lower extremities Skin: No rashes, lesions or ulcers Psychiatry: Judgement and insight  appear normal. Mood & affect appropriate.     Data Reviewed: I have personally reviewed following labs and imaging studies  CBC: Recent Labs  Lab 06/14/22 1949 06/15/22 0814 06/16/22 0340  WBC 13.5* 11.1* 11.6*  NEUTROABS  --  7.5  --    HGB 6.7* 7.0* 7.2*  HCT 23.6* 23.6* 24.7*  MCV 72.2* 73.3* 73.7*  PLT 339 271 096   Basic Metabolic Panel: Recent Labs  Lab 06/14/22 1949 06/14/22 2119 06/15/22 0832 06/15/22 1154 06/15/22 1520 06/16/22 0340  NA 139  --  138  --   --  139  K 5.3* 5.7* 5.5* 5.3* 5.6* 5.3*  CL 110  --  110  --   --  111  CO2 20*  --  18*  --   --  18*  GLUCOSE 123*  --  132*  --   --  166*  BUN 88*  --  88*  --   --  84*  CREATININE 4.00*  --  4.07*  --   --  4.15*  CALCIUM 7.6*  --  7.1*  --   --  7.1*  MG  --   --  2.6*  --   --   --   PHOS  --   --   --   --   --  9.1*   GFR: Estimated Creatinine Clearance: 30.8 mL/min (A) (by C-G formula based on SCr of 4.15 mg/dL (H)). Liver Function Tests: Recent Labs  Lab 06/14/22 2119 06/15/22 0832 06/16/22 0340  AST 16 12*  --   ALT 12 11  --   ALKPHOS 96 80  --   BILITOT 0.4 0.4  --   PROT 7.0 6.3*  --   ALBUMIN 3.0* 2.6* 2.7*   No results for input(s): "LIPASE", "AMYLASE" in the last 168 hours. No results for input(s): "AMMONIA" in the last 168 hours. Coagulation Profile: No results for input(s): "INR", "PROTIME" in the last 168 hours. Cardiac Enzymes: No results for input(s): "CKTOTAL", "CKMB", "CKMBINDEX", "TROPONINI" in the last 168 hours. BNP (last 3 results) No results for input(s): "PROBNP" in the last 8760 hours. HbA1C: No results for input(s): "HGBA1C" in the last 72 hours. CBG: No results for input(s): "GLUCAP" in the last 168 hours. Lipid Profile: No results for input(s): "CHOL", "HDL", "LDLCALC", "TRIG", "CHOLHDL", "LDLDIRECT" in the last 72 hours. Thyroid Function Tests: Recent Labs    06/15/22 0814  TSH 3.181   Anemia Panel: Recent Labs    06/15/22 0814  VITAMINB12 378  FOLATE 9.6  FERRITIN 20  TIBC 307  IRON 17*  RETICCTPCT 1.4   Sepsis Labs: No results for input(s): "PROCALCITON", "LATICACIDVEN" in the last 168 hours.  Recent Results (from the past 240 hour(s))  Urine Culture     Status: Abnormal    Collection Time: 06/15/22  1:20 AM   Specimen: Urine, Clean Catch  Result Value Ref Range Status   Specimen Description   Final    URINE, CLEAN CATCH Performed at [facility], [address] [place], [place] [address]    Special Requests   Final    NONE Performed at [facility], [address] [place], [place] [address]    Culture (A)  Final    10,000 COLONIES/mL GROUP B STREP(S.AGALACTIAE)ISOLATED TESTING AGAINST S. AGALACTIAE NOT ROUTINELY PERFORMED DUE TO PREDICTABILITY OF AMP/PEN/VAN SUSCEPTIBILITY. Performed at [facility], [place] [address] [place], [place] [address]    Report Status 06/16/2022 FINAL  Final  Radiology Studies: US RENAL  Result Date: 06/15/2022 CLINICAL DATA:  Acute kidney injury. EXAM: RENAL / URINARY TRACT ULTRASOUND COMPLETE COMPARISON:  CT abdomen pelvis 06/14/2022 FINDINGS: Right Kidney: Renal measurements: 13 x 5.6 x 6.8 cm = volume: 259 mL. Echogenicity within normal limits. No mass or hydronephrosis visualized. Left Kidney: Renal measurements: 12.4 x 5.0 x 6.7 cm = volume: 217 mL. Echogenicity within normal limits. No mass or hydronephrosis visualized. Bladder: Appears normal for degree of bladder distention. Other: Small left pleural effusion. IMPRESSION: 1. Normal renal ultrasound. 2. Small left pleural effusion Electronically Signed   By: Franchot Gallo M.D.   On: 06/15/2022 11:42   ECHOCARDIOGRAM COMPLETE  Result Date: 06/15/2022    ECHOCARDIOGRAM REPORT   Patient Name:   [name] Date of Exam: 06/15/2022 Medical Rec #:  [id number]      Height:       69.0 in Accession #:    [id number]     Weight:       345.0 lb Date of Birth:  01-30-1986      BSA:          2.605 m Patient Age:    53 years       BP:           125/80 mmHg Patient Gender: F              HR:           82 bpm. Exam Location:  [facility] Procedure: 2D Echo, Cardiac Doppler and Color Doppler Indications:    Dyspnea  History:        Patient has no prior history of  Echocardiogram examinations.                 Risk Factors:Hypertension and Diabetes.  Sonographer:    Wenda Low Referring Phys: [id number] ASIA B Belle  Sonographer Comments: Patient is obese. IMPRESSIONS  1. Left ventricular ejection fraction, by estimation, is 60 to 65%. The left ventricle has normal function. The left ventricle has no regional wall motion abnormalities. Left ventricular diastolic parameters were normal.  2. Right ventricular systolic function is normal. The right ventricular size is normal. There is mildly elevated pulmonary artery systolic pressure.  3. The mitral valve is grossly normal. Trivial mitral valve regurgitation. No evidence of mitral stenosis.  4. The aortic valve is normal in structure. Aortic valve regurgitation is not visualized. No aortic stenosis is present.  5. The inferior vena cava is normal in size with greater than 50% respiratory variability, suggesting right atrial pressure of 3 mmHg. Comparison(s): No prior Echocardiogram. FINDINGS  Left Ventricle: Left ventricular ejection fraction, by estimation, is 60 to 65%. The left ventricle has normal function. The left ventricle has no regional wall motion abnormalities. The left ventricular internal cavity size was normal in size. There is  borderline left ventricular hypertrophy. Left ventricular diastolic parameters were normal. Right Ventricle: The right ventricular size is normal. No increase in right ventricular wall thickness. Right ventricular systolic function is normal. There is mildly elevated pulmonary artery systolic pressure. The tricuspid regurgitant velocity is 2.88  m/s, and with an assumed right atrial pressure of 3 mmHg, the estimated right ventricular systolic pressure is 36.1 mmHg. Left Atrium: Left atrial size was normal in size. Right Atrium: Right atrial size was normal in size. Pericardium: There is no evidence of pericardial effusion. Mitral Valve: The mitral valve is grossly normal. Trivial  mitral valve regurgitation. No evidence of mitral valve stenosis.  MV peak gradient, 7.5 mmHg. The mean mitral valve gradient is 4.0 mmHg. Tricuspid Valve: The tricuspid valve is grossly normal. Tricuspid valve regurgitation is trivial. No evidence of tricuspid stenosis. Aortic Valve: The aortic valve is normal in structure. Aortic valve regurgitation is not visualized. No aortic stenosis is present. Aortic valve mean gradient measures 4.0 mmHg. Aortic valve peak gradient measures 8.2 mmHg. Aortic valve area, by VTI measures 2.31 cm. Pulmonic Valve: The pulmonic valve was not well visualized. Pulmonic valve regurgitation is not visualized. No evidence of pulmonic stenosis. Aorta: The aortic root is normal in size and structure. Venous: The inferior vena cava is normal in size with greater than 50% respiratory variability, suggesting right atrial pressure of 3 mmHg. IAS/Shunts: No atrial level shunt detected by color flow Doppler.  LEFT VENTRICLE PLAX 2D LVIDd:         5.20 cm   Diastology LVIDs:         3.40 cm   LV e' medial:    14.40 cm/s LV PW:         1.20 cm   LV E/e' medial:  9.6 LV IVS:        1.20 cm   LV e' lateral:   13.40 cm/s LVOT diam:     2.00 cm   LV E/e' lateral: 10.3 LV SV:         71 LV SV Index:   27 LVOT Area:     3.14 cm  RIGHT VENTRICLE RV Basal diam:  3.40 cm RV Mid diam:    3.00 cm RV S prime:     16.30 cm/s TAPSE (M-mode): 3.2 cm LEFT ATRIUM             Index        RIGHT ATRIUM           Index LA diam:        4.60 cm 1.77 cm/m   RA Area:     21.30 cm LA Vol (A2C):   85.0 ml 32.63 ml/m  RA Volume:   69.10 ml  26.53 ml/m LA Vol (A4C):   70.8 ml 27.18 ml/m LA Biplane Vol: 83.8 ml 32.17 ml/m  AORTIC VALVE                    PULMONIC VALVE AV Area (Vmax):    2.26 cm     PV Vmax:       1.08 m/s AV Area (Vmean):   2.18 cm     PV Peak grad:  4.7 mmHg AV Area (VTI):     2.31 cm AV Vmax:           143.00 cm/s AV Vmean:          97.800 cm/s AV VTI:            0.308 m AV Peak Grad:      8.2  mmHg AV Mean Grad:      4.0 mmHg LVOT Vmax:         103.00 cm/s LVOT Vmean:        68.000 cm/s LVOT VTI:          0.226 m LVOT/AV VTI ratio: 0.73  AORTA Ao Root diam: 3.30 cm MITRAL VALVE                TRICUSPID VALVE MV Area (PHT): 4.04 cm     TR Peak grad:   33.2 mmHg MV Area VTI:   2.09 cm  TR Vmax:        288.00 cm/s MV Peak grad:  7.5 mmHg MV Mean grad:  4.0 mmHg     SHUNTS MV Vmax:       1.37 m/s     Systemic VTI:  0.23 m MV Vmean:      91.7 cm/s    Systemic Diam: 2.00 cm MV Decel Time: 188 msec MV E velocity: 138.00 cm/s MV A velocity: 91.70 cm/s MV E/A ratio:  1.50 Vishnu Priya Mallipeddi Electronically signed by Lorelee Cover Mallipeddi Signature Date/Time: 06/15/2022/10:53:48 AM    Final    DG Chest Port 1 View  Result Date: 06/15/2022 CLINICAL DATA:  Bilateral leg swelling.  Chest pain. EXAM: PORTABLE CHEST 1 VIEW COMPARISON:  06/14/2022 FINDINGS: 0516 hours. Low volume film. The cardio pericardial silhouette is enlarged. There is pulmonary vascular congestion without overt pulmonary edema. Bibasilar atelectasis/infiltrate noted, left greater than right with small bilateral pleural effusions, similar to prior. Telemetry leads overlie the chest. IMPRESSION: Low volume film with bibasilar atelectasis/infiltrate and small bilateral pleural effusions, left greater than right. Electronically Signed   By: Misty Stanley M.D.   On: 06/15/2022 05:39   CT Renal Stone Study  Result Date: 06/14/2022 CLINICAL DATA:  Swelling low hemoglobin elevated creatinine EXAM: CT ABDOMEN AND PELVIS WITHOUT CONTRAST TECHNIQUE: Multidetector CT imaging of the abdomen and pelvis was performed following the standard protocol without IV contrast. RADIATION DOSE REDUCTION: This exam was performed according to the departmental dose-optimization program which includes automated exposure control, adjustment of the mA and/or kV according to patient size and/or use of iterative reconstruction technique. COMPARISON:  CT  08/08/2011 FINDINGS: Lower chest: Lung bases demonstrate small bilateral pleural effusions. Probable passive atelectasis in the left lower lobe. Upper normal cardiac size. Hepatobiliary: No focal liver abnormality is seen. No gallstones, gallbladder wall thickening, or biliary dilatation. Pancreas: Unremarkable. No pancreatic ductal dilatation or surrounding inflammatory changes. Spleen: Enlarged up to 16.3 cm. Adrenals/Urinary Tract: Adrenal glands are unremarkable. Kidneys are normal, without renal calculi, focal lesion, or hydronephrosis. Bladder is unremarkable. Stomach/Bowel: Stomach is within normal limits. Appendix appears normal. No evidence of bowel wall thickening, distention, or inflammatory changes. Vascular/Lymphatic: No significant vascular findings are present. No enlarged abdominal or pelvic lymph nodes. Reproductive: Uterus and bilateral adnexa are unremarkable. Other: Negative for pelvic effusion or free air. Small fat containing umbilical hernia. Edema within the subcutaneous soft tissues of the abdominal wall. Musculoskeletal: No acute osseous abnormality. IMPRESSION: 1. No CT evidence for acute intra-abdominal or pelvic abnormality. 2. Small bilateral pleural effusions. Mild diffuse subcutaneous edema. 3. Spleen appears slightly enlarged Electronically Signed   By: Donavan Foil M.D.   On: 06/14/2022 22:14        Scheduled Meds:  [START ON 06/19/2022] ferrous sulfate  325 mg Oral BID WC   Continuous Infusions:  ferric gluconate (FERRLECIT) IVPB 250 mg (06/16/22 1016)   furosemide 160 mg (06/16/22 2018)     LOS: 2 days    Time spent: 44mins    Kathie Dike, MD [facility]   If 7PM-7AM, please contact night-coverage [email or web address]  06/16/2022, 9:08 PM

## 2022-06-18 DIAGNOSIS — N390 Urinary tract infection, site not specified: Secondary | ICD-10-CM | POA: Diagnosis not present

## 2022-06-18 DIAGNOSIS — N179 Acute kidney failure, unspecified: Secondary | ICD-10-CM | POA: Diagnosis not present

## 2022-06-18 DIAGNOSIS — D649 Anemia, unspecified: Secondary | ICD-10-CM | POA: Diagnosis not present

## 2022-06-18 DIAGNOSIS — I10 Essential (primary) hypertension: Secondary | ICD-10-CM | POA: Diagnosis not present

## 2022-06-18 LAB — PROTIME-INR
INR: 1.2 (ref 0.8–1.2)
Prothrombin Time: 15.3 seconds — ABNORMAL HIGH (ref 11.4–15.2)

## 2022-06-18 LAB — CBC
HCT: 27 % — ABNORMAL LOW (ref 36.0–46.0)
Hemoglobin: 7.9 g/dL — ABNORMAL LOW (ref 12.0–15.0)
MCH: 21.5 pg — ABNORMAL LOW (ref 26.0–34.0)
MCHC: 29.3 g/dL — ABNORMAL LOW (ref 30.0–36.0)
MCV: 73.4 fL — ABNORMAL LOW (ref 80.0–100.0)
Platelets: 296 10*3/uL (ref 150–400)
RBC: 3.68 MIL/uL — ABNORMAL LOW (ref 3.87–5.11)
RDW: 19 % — ABNORMAL HIGH (ref 11.5–15.5)
WBC: 12 10*3/uL — ABNORMAL HIGH (ref 4.0–10.5)
nRBC: 0 % (ref 0.0–0.2)

## 2022-06-18 LAB — RENAL FUNCTION PANEL
Albumin: 3 g/dL — ABNORMAL LOW (ref 3.5–5.0)
Anion gap: 12 (ref 5–15)
BUN: 76 mg/dL — ABNORMAL HIGH (ref 6–20)
CO2: 24 mmol/L (ref 22–32)
Calcium: 7.3 mg/dL — ABNORMAL LOW (ref 8.9–10.3)
Chloride: 104 mmol/L (ref 98–111)
Creatinine, Ser: 3.77 mg/dL — ABNORMAL HIGH (ref 0.44–1.00)
GFR, Estimated: 15 mL/min — ABNORMAL LOW (ref >60)
Glucose, Bld: 127 mg/dL — ABNORMAL HIGH (ref 70–99)
Phosphorus: 10 mg/dL — ABNORMAL HIGH (ref 2.5–4.6)
Potassium: 3.8 mmol/L (ref 3.5–5.1)
Sodium: 140 mmol/L (ref 135–145)

## 2022-06-18 LAB — C3 COMPLEMENT: C3 Complement: 57 mg/dL — ABNORMAL LOW (ref 82–167)

## 2022-06-18 LAB — C4 COMPLEMENT: Complement C4, Body Fluid: 25 mg/dL (ref 12–38)

## 2022-06-18 MED ORDER — POTASSIUM CHLORIDE CRYS ER 20 MEQ PO TBCR
40.0000 meq | EXTENDED_RELEASE_TABLET | Freq: Once | ORAL | Status: AC
  Administered 2022-06-18: 40 meq via ORAL
  Filled 2022-06-18: qty 2

## 2022-06-18 NOTE — Progress Notes (Signed)
Subjective:  Had 2319mL of UOP recorded 720 in.  Echo looks normal -  K better at 3.8 -  hgb remains at 7.7. Some nausea this am but overall feeling much better and actually asking when she will go home.  Objective Vital signs in last 24 hours: Vitals:   06/17/22 1944 06/18/22 0206 06/18/22 0351 06/18/22 0447  BP: (!) 149/81 (!) 154/78 121/63   Pulse: 87 88 84   Resp: 20  18   Temp: 98.2 F (36.8 C)  (!) 97.5 F (36.4 C)   TempSrc:   Oral   SpO2: 97%  92%   Weight:    (!) 151.8 kg  Height:       Weight change: -4.963 kg  Intake/Output Summary (Last 24 hours) at 06/18/2022 1211 Last data filed at 06/18/2022 0359 Gross per 24 hour  Intake 2017.95 ml  Output 2300 ml  Net -282.05 ml    Assessment/Plan: 36 year old WF with no past medical history and no medications-  presents with a crt of 4-  proteinuria and hematuria-  possible viral syndrome prodrome-  also anemia and hypoalbuminemia  1.Renal- dont have any recent kidney lab values.  So dont know if this change in renal function is acute or been happening for the last 6 years. NL function in 2017; she has proteinuria, hematuria  and hypoalbuminemia so suspicious for a GN.  Quantifying protein-  (2.7 grams)  and checking serologies (ANCA pending; GBM neg; ANA and ASO positive); ANA titer in process and will request a DSDNA. Renal ultrasound looks normal w/ no e/o obstruction.   we will need to do a kidney biopsy to evaluate-  scheduled for Monday AM (pt is not on any blood thinning agents) -  order in.  No empiric treatment yet as there is a possibility this has been happening for a while. No absolute indications for dialysis at this time. Renal function slowly improving but still needs a biopsy given the degree of proteinuria and hematuria. If pt is ready for discharge in the next 37-62GBT certainly can have close f/u with [facility] in the next week to go over the biopsy results and formulate a therapeutic plan. 2. Hypertension/volume  - is  overloaded-  was given lasix but with moderate response, but better response with  incr dose + metolazone. Great UOP over the past 24hrs and improvement in renal function as well. Wonder if there is a component of CRS. 3. Anemia  - is severe and makes me think her CKD is not new-  has been transfused with not a great bump in hgb -  Ferrlecit 3/4 250mg  given; ESA.  Her platelets are OK; haptoglobin NL.  4. Renal osteodystrophy Start Auryxia 2 tabs TIDM -> will adjust and change as tolerated. 5. Potassium-  slightly high after blood-  suspect will come down If diuresis is successful-  not yet but is not dangerous-  increasing diuresis as above 6. Hypoalbuminemia-  could be due to nephrotic syndrome or just in the setting of recent viral illness and not eating.  Quantifying proteinuria will help determine which -  proteinuria not extreme at 2.7 grams-  maybe a combo of both     [facility] Oris    Labs: Basic Metabolic Panel: Recent Labs  Lab 06/16/22 0340 06/17/22 0548 06/18/22 0528  NA 139 141 140  K 5.3* 4.7 3.8  CL 111 108 104  CO2 18* 21* 24  GLUCOSE 166* 106* 127*  BUN 84* 79*  76*  CREATININE 4.15* 3.90* 3.77*  CALCIUM 7.1* 7.3* 7.3*  PHOS 9.1* 9.4* 10.0*   Liver Function Tests: Recent Labs  Lab 06/14/22 2119 06/15/22 0832 06/16/22 0340 06/17/22 0548 06/18/22 0528  AST 16 12*  --   --   --   ALT 12 11  --   --   --   ALKPHOS 96 80  --   --   --   BILITOT 0.4 0.4  --   --   --   PROT 7.0 6.3*  --   --   --   ALBUMIN 3.0* 2.6* 2.7* 2.9* 3.0*   No results for input(s): "LIPASE", "AMYLASE" in the last 168 hours. No results for input(s): "AMMONIA" in the last 168 hours. CBC: Recent Labs  Lab 06/14/22 1949 06/15/22 0814 06/16/22 0340 06/17/22 0548 06/18/22 0527  WBC 13.5* 11.1* 11.6* 11.7* 12.0*  NEUTROABS  --  7.5  --   --   --   HGB 6.7* 7.0* 7.2* 7.7* 7.9*  HCT 23.6* 23.6* 24.7* 26.3* 27.0*  MCV 72.2* 73.3* 73.7* 73.7* 73.4*  PLT 339 271 281 295 296   Cardiac  Enzymes: No results for input(s): "CKTOTAL", "CKMB", "CKMBINDEX", "TROPONINI" in the last 168 hours. CBG: No results for input(s): "GLUCAP" in the last 168 hours.  Iron Studies:  No results for input(s): "IRON", "TIBC", "TRANSFERRIN", "FERRITIN" in the last 72 hours.  Studies/Results: No results found. Medications: Infusions:  furosemide Stopped (06/18/22 0307)    Scheduled Medications:  ferric citrate  420 mg Oral TID WC   [START ON 06/19/2022] ferrous sulfate  325 mg Oral BID WC    have reviewed scheduled and prn medications.  Physical Exam: General:  NAD feeling better Heart: RRR Lungs: dec BS at bases Abdomen: obese, non tender Extremities: pitting edema     06/18/2022,12:12 PM  LOS: 4 days

## 2022-06-18 NOTE — Progress Notes (Signed)
PROGRESS NOTE    [name]  AQT:[id number] DOB: [birth year] DOA: 06/14/2022 PCP: Patient, No Pcp Per    Brief Narrative:  36 year old female, admitted to the hospital with dyspnea on exertion.  Found to have elevated creatinine of 4.0.  She is noted to have proteinuria.  She was also anemic with a hemoglobin of 6.7.  She was transfused PRBC.  Nephrology following.  She is undergoing further work-up for renal failure as well as attempted diuresis.  Plans are for renal biopsy on 11/13.   Assessment & Plan:   Principal Problem:   Symptomatic anemia Active Problems:   AKI (acute kidney injury) (HCC)   Hyperkalemia   HTN (hypertension)   UTI (urinary tract infection)   Peripheral edema   Symptomatic iron deficiency anemia -Possibly related to blood loss from patient menses -Hemoglobin was down to 6.7 on admission -She has received 1 unit of PRBC, second unit was discontinued due to development of itching -She also received iron infusion as well as Aranesp -Follow-up hemoglobin has been stable at 7.7 -Haptoglobin is not significantly low  Possible AKI -No prior labs for comparison -Creatinine currently elevated at 4 -Nephrology following, appreciate input -Renal ultrasound shows normal-sized kidneys -Further work-up including ANCA, complement levels, anti-double-stranded DNA antibody currently in process -Sed rate 68 -Anti-GBM antibody negative -ANA positive, will check reflex panel -ASO titers positive -Urine protein is elevated -Plans for renal biopsy on 11/13  Hyperkalemia -Trending down with Lasix  Hyperphosphatemia -Started on ferric citrate  Hypoalbuminemia -Although she does have proteinuria, does not appear to be nephrotic range  Volume overload -Continue high-dose IV Lasix -Monitor urine output -Echocardiogram shows normal ejection fraction  Hypertension -Continue to trend blood pressures with diuresis -She is not on any chronic meds  Obesity,  class III -BMI 51.04 -Continue to monitor weight with diuresis   DVT prophylaxis: SCDs Start: 06/15/22 0059  Code Status: full code Family Communication: discussed with husband at bedside Disposition Plan: Status is: Inpatient Remains inpatient appropriate because: Further management of volume overload and work-up of renal failure     Consultants:  Nephrology  Procedures:    Antimicrobials:     Subjective: Reports improvement of urine output.  Overall dyspnea on exertion and orthopnea is better.  She had some vomiting this morning, but feels that nausea overall is better.  She was able to get up and shower today without any shortness of breath.  Objective: Vitals:   06/18/22 0206 06/18/22 0351 06/18/22 0447 06/18/22 1510  BP: (!) 154/78 121/63  (!) 157/82  Pulse: 88 84  87  Resp:  18    Temp:  (!) 97.5 F (36.4 C)  97.8 F (36.6 C)  TempSrc:  Oral  Oral  SpO2:  92%  97%  Weight:   (!) 151.8 kg   Height:        Intake/Output Summary (Last 24 hours) at 06/18/2022 1819 Last data filed at 06/18/2022 1754 Gross per 24 hour  Intake 1081.71 ml  Output 2300 ml  Net -1218.29 ml   Filed Weights   06/16/22 0546 06/17/22 0559 06/18/22 0447  Weight: (!) 161.2 kg (!) 156.8 kg (!) 151.8 kg    Examination:  General exam: Appears calm and comfortable  Respiratory system: Clear to auscultation. Respiratory effort normal. Cardiovascular system: S1 & S2 heard, RRR. No JVD, murmurs, rubs, gallops or clicks.  Gastrointestinal system: Abdomen is nondistended, soft and nontender. No organomegaly or masses felt. Normal bowel sounds heard. Central nervous system: Alert  and oriented. No focal neurological deficits. Extremities: Significant edema lower extremities Skin: No rashes, lesions or ulcers Psychiatry: Judgement and insight appear normal. Mood & affect appropriate.     Data Reviewed: I have personally reviewed following labs and imaging studies  CBC: Recent Labs   Lab 06/14/22 1949 06/15/22 0814 06/16/22 0340 06/17/22 0548 06/18/22 0527  WBC 13.5* 11.1* 11.6* 11.7* 12.0*  NEUTROABS  --  7.5  --   --   --   HGB 6.7* 7.0* 7.2* 7.7* 7.9*  HCT 23.6* 23.6* 24.7* 26.3* 27.0*  MCV 72.2* 73.3* 73.7* 73.7* 73.4*  PLT 339 271 281 295 188   Basic Metabolic Panel: Recent Labs  Lab 06/14/22 1949 06/14/22 2119 06/15/22 0832 06/15/22 1154 06/15/22 1520 06/16/22 0340 06/17/22 0548 06/18/22 0528  NA 139  --  138  --   --  139 141 140  K 5.3*   < > 5.5* 5.3* 5.6* 5.3* 4.7 3.8  CL 110  --  110  --   --  111 108 104  CO2 20*  --  18*  --   --  18* 21* 24  GLUCOSE 123*  --  132*  --   --  166* 106* 127*  BUN 88*  --  88*  --   --  84* 79* 76*  CREATININE 4.00*  --  4.07*  --   --  4.15* 3.90* 3.77*  CALCIUM 7.6*  --  7.1*  --   --  7.1* 7.3* 7.3*  MG  --   --  2.6*  --   --   --   --   --   PHOS  --   --   --   --   --  9.1* 9.4* 10.0*   < > = values in this interval not displayed.   GFR: Estimated Creatinine Clearance: 32.7 mL/min (A) (by C-G formula based on SCr of 3.77 mg/dL (H)). Liver Function Tests: Recent Labs  Lab 06/14/22 2119 06/15/22 0832 06/16/22 0340 06/17/22 0548 06/18/22 0528  AST 16 12*  --   --   --   ALT 12 11  --   --   --   ALKPHOS 96 80  --   --   --   BILITOT 0.4 0.4  --   --   --   PROT 7.0 6.3*  --   --   --   ALBUMIN 3.0* 2.6* 2.7* 2.9* 3.0*   No results for input(s): "LIPASE", "AMYLASE" in the last 168 hours. No results for input(s): "AMMONIA" in the last 168 hours. Coagulation Profile: Recent Labs  Lab 06/18/22 0527  INR 1.2   Cardiac Enzymes: No results for input(s): "CKTOTAL", "CKMB", "CKMBINDEX", "TROPONINI" in the last 168 hours. BNP (last 3 results) No results for input(s): "PROBNP" in the last 8760 hours. HbA1C: No results for input(s): "HGBA1C" in the last 72 hours. CBG: No results for input(s): "GLUCAP" in the last 168 hours. Lipid Profile: No results for input(s): "CHOL", "HDL", "LDLCALC",  "TRIG", "CHOLHDL", "LDLDIRECT" in the last 72 hours. Thyroid Function Tests: No results for input(s): "TSH", "T4TOTAL", "FREET4", "T3FREE", "THYROIDAB" in the last 72 hours.  Anemia Panel: No results for input(s): "VITAMINB12", "FOLATE", "FERRITIN", "TIBC", "IRON", "RETICCTPCT" in the last 72 hours.  Sepsis Labs: No results for input(s): "PROCALCITON", "LATICACIDVEN" in the last 168 hours.  Recent Results (from the past 240 hour(s))  Urine Culture     Status: Abnormal   Collection Time: 06/15/22  1:20  AM   Specimen: Urine, Clean Catch  Result Value Ref Range Status   Specimen Description   Final    URINE, CLEAN CATCH Performed at [facility], [address], [place], [place] [address]    Special Requests   Final    NONE Performed at [facility], [address] [place], [place] [address]    Culture (A)  Final    10,000 COLONIES/mL GROUP B STREP(S.AGALACTIAE)ISOLATED TESTING AGAINST S. AGALACTIAE NOT ROUTINELY PERFORMED DUE TO PREDICTABILITY OF AMP/PEN/VAN SUSCEPTIBILITY. Performed at [facility], [place] [address] [place], [place] [address]    Report Status 06/16/2022 FINAL  Final         Radiology Studies: No results found.      Scheduled Meds:  ferric citrate  420 mg Oral TID WC   [START ON 06/19/2022] ferrous sulfate  325 mg Oral BID WC   Continuous Infusions:  furosemide 160 mg (06/18/22 1816)     LOS: 4 days    Time spent: 58mins    Kathie Dike, MD [facility]   If 7PM-7AM, please contact night-coverage [email or web address]  06/18/2022, 6:19 PM

## 2022-06-18 NOTE — Plan of Care (Signed)
  Problem: Clinical Measurements: Goal: Cardiovascular complication will be avoided Outcome: Not Progressing   Problem: Elimination: Goal: Will not experience complications related to urinary retention Outcome: Not Progressing

## 2022-06-19 ENCOUNTER — Ambulatory Visit (HOSPITAL_COMMUNITY)
Admit: 2022-06-19 | Discharge: 2022-06-19 | Disposition: A | Discharge status: Home / Self Care | Payer: Medicaid Other | Attending: Nephrology | Admitting: Nephrology

## 2022-06-19 DIAGNOSIS — N049 Nephrotic syndrome with unspecified morphologic changes: Secondary | ICD-10-CM

## 2022-06-19 DIAGNOSIS — E875 Hyperkalemia: Secondary | ICD-10-CM

## 2022-06-19 DIAGNOSIS — R609 Edema, unspecified: Secondary | ICD-10-CM

## 2022-06-19 DIAGNOSIS — R739 Hyperglycemia, unspecified: Secondary | ICD-10-CM

## 2022-06-19 LAB — RENAL FUNCTION PANEL
Albumin: 2.9 g/dL — ABNORMAL LOW (ref 3.5–5.0)
Anion gap: 12 (ref 5–15)
BUN: 74 mg/dL — ABNORMAL HIGH (ref 6–20)
CO2: 25 mmol/L (ref 22–32)
Calcium: 7.1 mg/dL — ABNORMAL LOW (ref 8.9–10.3)
Chloride: 102 mmol/L (ref 98–111)
Creatinine, Ser: 3.99 mg/dL — ABNORMAL HIGH (ref 0.44–1.00)
GFR, Estimated: 14 mL/min — ABNORMAL LOW (ref >60)
Glucose, Bld: 119 mg/dL — ABNORMAL HIGH (ref 70–99)
Phosphorus: 9.1 mg/dL — ABNORMAL HIGH (ref 2.5–4.6)
Potassium: 3.9 mmol/L (ref 3.5–5.1)
Sodium: 139 mmol/L (ref 135–145)

## 2022-06-19 LAB — CBC
HCT: 28.1 % — ABNORMAL LOW (ref 36.0–46.0)
Hemoglobin: 8.3 g/dL — ABNORMAL LOW (ref 12.0–15.0)
MCH: 22.1 pg — ABNORMAL LOW (ref 26.0–34.0)
MCHC: 29.5 g/dL — ABNORMAL LOW (ref 30.0–36.0)
MCV: 74.7 fL — ABNORMAL LOW (ref 80.0–100.0)
Platelets: 292 10*3/uL (ref 150–400)
RBC: 3.76 MIL/uL — ABNORMAL LOW (ref 3.87–5.11)
RDW: 19.8 % — ABNORMAL HIGH (ref 11.5–15.5)
WBC: 12.2 10*3/uL — ABNORMAL HIGH (ref 4.0–10.5)
nRBC: 0 % (ref 0.0–0.2)

## 2022-06-19 LAB — ENA+DNA/DS+ANTICH+CENTRO+JO...
Anti JO-1: <0.2 AI (ref 0.0–0.9)
Centromere Ab Screen: <0.2 AI (ref 0.0–0.9)
Chromatin Ab SerPl-aCnc: <0.2 AI (ref 0.0–0.9)
ENA SM Ab Ser-aCnc: <0.2 AI (ref 0.0–0.9)
Ribonucleic Protein: 2.2 AI — ABNORMAL HIGH (ref 0.0–0.9)
SSA (Ro) (ENA) Antibody, IgG: <0.2 AI (ref 0.0–0.9)
SSB (La) (ENA) Antibody, IgG: <0.2 AI (ref 0.0–0.9)
Scleroderma (Scl-70) (ENA) Antibody, IgG: <0.2 AI (ref 0.0–0.9)
ds DNA Ab: <1 IU/mL (ref 0–9)

## 2022-06-19 LAB — HEMOGLOBIN A1C
Hgb A1c MFr Bld: 6.7 % — ABNORMAL HIGH (ref 4.8–5.6)
Mean Plasma Glucose: 145.59 mg/dL

## 2022-06-19 LAB — ANCA TITERS
Atypical P-ANCA titer: <1:20 {titer}
C-ANCA: <1:20 {titer}
P-ANCA: <1:20 {titer}

## 2022-06-19 LAB — ANTI-DNA ANTIBODY, DOUBLE-STRANDED: ds DNA Ab: <1 IU/mL (ref 0–9)

## 2022-06-19 LAB — ANA W/REFLEX IF POSITIVE: Anti Nuclear Antibody (ANA): POSITIVE — AB

## 2022-06-19 MED ORDER — PANTOPRAZOLE SODIUM 40 MG PO TBEC
40.0000 mg | DELAYED_RELEASE_TABLET | Freq: Every day | ORAL | Status: DC
  Administered 2022-06-19 – 2022-06-24 (×6): 40 mg via ORAL
  Filled 2022-06-19 (×6): qty 1

## 2022-06-19 MED ORDER — GELATIN ABSORBABLE 12-7 MM EX MISC
1.0000 | Freq: Once | CUTANEOUS | Status: AC
  Administered 2022-06-19: 1 via TOPICAL

## 2022-06-19 MED ORDER — DIPHENHYDRAMINE HCL 50 MG/ML IJ SOLN
INTRAMUSCULAR | Status: AC
  Filled 2022-06-19: qty 1

## 2022-06-19 MED ORDER — HYDRALAZINE HCL 20 MG/ML IJ SOLN
INTRAMUSCULAR | Status: AC | PRN
  Administered 2022-06-19: 10 mg via INTRAVENOUS

## 2022-06-19 MED ORDER — LABETALOL HCL 200 MG PO TABS
400.0000 mg | ORAL_TABLET | Freq: Two times a day (BID) | ORAL | Status: DC
  Administered 2022-06-19 – 2022-06-22 (×7): 400 mg via ORAL
  Filled 2022-06-19 (×7): qty 2

## 2022-06-19 MED ORDER — ONDANSETRON HCL 4 MG/2ML IJ SOLN
INTRAMUSCULAR | Status: AC
  Filled 2022-06-19: qty 2

## 2022-06-19 MED ORDER — DIPHENHYDRAMINE HCL 50 MG/ML IJ SOLN
INTRAMUSCULAR | Status: AC | PRN
  Administered 2022-06-19: 12.5 mg via INTRAVENOUS

## 2022-06-19 MED ORDER — HYDRALAZINE HCL 20 MG/ML IJ SOLN
INTRAMUSCULAR | Status: AC
  Administered 2022-06-19: 10 mg
  Filled 2022-06-19: qty 1

## 2022-06-19 MED ORDER — HYDRALAZINE HCL 20 MG/ML IJ SOLN
INTRAMUSCULAR | Status: AC
  Filled 2022-06-19: qty 1

## 2022-06-19 MED ORDER — LIDOCAINE HCL 1 % IJ SOLN: 10.0000 mL | Freq: Once | INTRAMUSCULAR | Status: DC

## 2022-06-19 MED ORDER — FENTANYL CITRATE (PF) 100 MCG/2ML IJ SOLN
INTRAMUSCULAR | Status: AC | PRN
  Administered 2022-06-19: 50 ug via INTRAVENOUS

## 2022-06-19 MED ORDER — FUROSEMIDE 10 MG/ML IJ SOLN
160.0000 mg | Freq: Three times a day (TID) | INTRAVENOUS | Status: DC
  Administered 2022-06-19 – 2022-06-21 (×7): 160 mg via INTRAVENOUS
  Filled 2022-06-19 (×12): qty 16

## 2022-06-19 MED ORDER — MIDAZOLAM HCL 2 MG/2ML IJ SOLN
INTRAMUSCULAR | Status: AC
  Filled 2022-06-19: qty 4

## 2022-06-19 MED ORDER — FENTANYL CITRATE (PF) 100 MCG/2ML IJ SOLN
INTRAMUSCULAR | Status: AC
  Filled 2022-06-19: qty 4

## 2022-06-19 MED ORDER — METOCLOPRAMIDE HCL 5 MG/ML IJ SOLN: 10.0000 mg | Freq: Three times a day (TID) | INTRAMUSCULAR | Status: DC | PRN

## 2022-06-19 MED ORDER — MIDAZOLAM HCL 2 MG/2ML IJ SOLN
INTRAMUSCULAR | Status: AC | PRN
  Administered 2022-06-19: 1 mg via INTRAVENOUS

## 2022-06-19 NOTE — Plan of Care (Signed)
  Problem: Clinical Measurements: Goal: Diagnostic test results will improve Outcome: Not Progressing   Problem: Elimination: Goal: Will not experience complications related to urinary retention Outcome: Not Progressing

## 2022-06-19 NOTE — Consult Note (Signed)
Chief Complaint: Patient was seen in consultation today for non-focal renal biopsy   Referring Physician(s): Dr. Moshe Cipro  Supervising Physician: Mir, Sharen Heck  Patient Status: [name] - Inpatient   History of Present Illness: [name] is a 36 y.o. female with a medical history significant for anemia, gestational DM and HTN. She presented to the [facility] ED 06/14/22 with complaints of dyspnea, nausea, vomiting, diarrhea, peripheral edema, and decreased urine output. Work up was significant for UTI, Acute kidney injury with proteinuria/hematuria, Cr. of 4, and hyperkalemia. She also had a hemoglobin of 6.7 and she received two units of PRBCs. CT and US imaging of the kidneys were unrevealing.   Interventional Radiology has been asked to evaluate this patient for an image-guided non-focal renal biopsy for further work up.   Past Medical History:  Diagnosis Date   Anemia    Gestational diabetes    diet controlled   Gestational diabetes mellitus, antepartum    Granuloma, skin    incisional pfannenstiel scar   Hypertension    Preterm labor    Supervision of other high-risk pregnancy 05/26/2015    [facility]  Initiated Care at   55 weeks  FOB Lilla Shook  Dating By LMP/8 wek [place]  Pap 05/26/2015 neg  GC/CT Initial: -/-               36+wks:  Genetic Screen NT/IT: neg  CF screen Pos  FOB:  Anatomic [place] Female, limited view of head, repeat:  Flu vaccine 05/26/2015   Tdap Recommended ~ 28wks  Glucose Screen  2 hr  GBS   Feed Preference bottle  Contraception undecided  Circumcision yes  Childbirth Classes declined  Pediatrician [facility]        Past Surgical History:  Procedure Laterality Date   ABDOMINAL SURGERY     TUMMY TUCK EXCISED ENDOMETRIOSIS   CESAREAN SECTION     CESAREAN SECTION N/A 12/14/2015   Procedure: CESAREAN SECTION;  Surgeon: Florian Buff, MD;  Location: [place];  Service: Obstetrics;  Laterality: N/A;   ENDOMETRIAL FULGURATION   07/07/11   NASAL SINUS SURGERY  2007   TONSILLECTOMY  2008   TUBAL LIGATION Bilateral 12/14/2015   Procedure: BILATERAL TUBAL LIGATION;  Surgeon: Florian Buff, MD;  Location: [place];  Service: Obstetrics;  Laterality: Bilateral;  fallopian tubes    Allergies: Adhesive [tape], Promethazine hcl, and Vicodin [hydrocodone-acetaminophen]  Medications: Prior to Admission medications   Medication Sig Start Date End Date Taking? Authorizing Provider  doxycycline (VIBRAMYCIN) 100 MG capsule Take 1 capsule (100 mg total) by mouth 2 (two) times daily. Patient not taking: Reported on 06/15/2022 09/23/18   Fransico Meadow, PA-C  ferrous sulfate 325 (65 FE) MG tablet Take 1 tablet (325 mg total) by mouth 2 (two) times daily with a meal. Patient not taking: Reported on 06/15/2022 12/16/15   Wouk, Ailene Rud, MD  hydrochlorothiazide (HYDRODIURIL) 12.5 MG tablet Take 1 tablet (12.5 mg total) by mouth daily. Patient not taking: Reported on 06/15/2022 12/16/15   Wouk, Ailene Rud, MD  ibuprofen (ADVIL,MOTRIN) 600 MG tablet Take 1 tablet (600 mg total) by mouth every 6 (six) hours. Patient not taking: Reported on 06/15/2022 12/16/15   Wouk, Ailene Rud, MD  labetalol (NORMODYNE) 200 MG tablet Take 2 tablets (400 mg total) by mouth 2 (two) times daily. Patient not taking: Reported on 06/15/2022 12/16/15   Wouk, Ailene Rud, MD  oxyCODONE-acetaminophen (PERCOCET/ROXICET) 5-325 MG tablet Take 1  tablet by mouth every 6 (six) hours as needed (pain scale 4-7). Patient not taking: Reported on 12/21/2015 12/16/15   Wouk, Ailene Rud, MD  Prenatal Vit-Fe Fumarate-FA (PRENATAL VITAMIN PO) Take by mouth. Patient not taking: Reported on 06/15/2022    [provider]     Family History  Problem Relation Age of Onset   Diabetes Mother    Hypertension Mother    Heart disease Father    Cancer Paternal Grandmother    Cancer Paternal Uncle     Social History   Socioeconomic History   Marital status:  Married    Spouse name: Not on file   Number of children: Not on file   Years of education: Not on file   Highest education level: Not on file  Occupational History   Not on file  Tobacco Use   Smoking status: Never   Smokeless tobacco: Never  Substance and Sexual Activity   Alcohol use: No   Drug use: No   Sexual activity: Yes    Birth control/protection: None  Other Topics Concern   Not on file  Social History Narrative   Not on file   Social Determinants of Health   Financial Resource Strain: Not on file  Food Insecurity: No Food Insecurity (06/15/2022)   Hunger Vital Sign    Worried About Running Out of Food in the Last Year: Never true    Ran Out of Food in the Last Year: Never true  Transportation Needs: No Transportation Needs (06/15/2022)   PRAPARE - Hydrologist (Medical): No    Lack of Transportation (Non-Medical): No  Physical Activity: Not on file  Stress: Not on file  Social Connections: Not on file    Review of Systems: A 12 point ROS discussed and pertinent positives are indicated in the HPI above.  All other systems are negative.  Review of Systems  Constitutional:  Positive for appetite change and fatigue.  Respiratory:  Positive for cough.   Cardiovascular:  Positive for leg swelling. Negative for chest pain.  Gastrointestinal:  Positive for nausea and vomiting. Negative for abdominal pain and diarrhea.  Neurological:  Positive for headaches. Negative for dizziness.    Vital Signs: BP (!) 168/96 (BP Location: Right Arm)   Pulse 87   Temp 97.8 F (36.6 C)   Resp 20   Ht 5\' 9"  (1.753 m)   Wt (!) 326 lb 15.1 oz (148.3 kg)   LMP 06/14/2022   SpO2 95%   BMI 48.28 kg/m   Physical Exam Constitutional:      General: She is not in acute distress.    Appearance: She is obese. She is not ill-appearing.  HENT:     Mouth/Throat:     Mouth: Mucous membranes are moist.     Pharynx: Oropharynx is clear.  Cardiovascular:      Rate and Rhythm: Normal rate and regular rhythm.     Pulses: Normal pulses.     Heart sounds: Normal heart sounds.  Pulmonary:     Effort: Pulmonary effort is normal.     Breath sounds: Normal breath sounds.  Abdominal:     General: Bowel sounds are normal.     Palpations: Abdomen is soft.     Tenderness: There is no abdominal tenderness.  Musculoskeletal:     Right lower leg: Edema present.     Left lower leg: Edema present.  Skin:    General: Skin is warm and dry.  Neurological:  Mental Status: She is alert and oriented to person, place, and time.     Imaging: US RENAL  Result Date: 06/15/2022 CLINICAL DATA:  Acute kidney injury. EXAM: RENAL / URINARY TRACT ULTRASOUND COMPLETE COMPARISON:  CT abdomen pelvis 06/14/2022 FINDINGS: Right Kidney: Renal measurements: 13 x 5.6 x 6.8 cm = volume: 259 mL. Echogenicity within normal limits. No mass or hydronephrosis visualized. Left Kidney: Renal measurements: 12.4 x 5.0 x 6.7 cm = volume: 217 mL. Echogenicity within normal limits. No mass or hydronephrosis visualized. Bladder: Appears normal for degree of bladder distention. Other: Small left pleural effusion. IMPRESSION: 1. Normal renal ultrasound. 2. Small left pleural effusion Electronically Signed   By: Franchot Gallo M.D.   On: 06/15/2022 11:42   ECHOCARDIOGRAM COMPLETE  Result Date: 06/15/2022    ECHOCARDIOGRAM REPORT   Patient Name:   [name] Date of Exam: 06/15/2022 Medical Rec #:  [id number]      Height:       69.0 in Accession #:    [id number]     Weight:       345.0 lb Date of Birth:  07-13-1986      BSA:          2.605 m Patient Age:    49 years       BP:           125/80 mmHg Patient Gender: F              HR:           82 bpm. Exam Location:  [name] Procedure: 2D Echo, Cardiac Doppler and Color Doppler Indications:    Dyspnea  History:        Patient has no prior history of Echocardiogram examinations.                 Risk Factors:Hypertension and Diabetes.   Sonographer:    Wenda Low Referring Phys: [id number] AS[place]  Sonographer Comments: Patient is obese. IMPRESSIONS  1. Left ventricular ejection fraction, by estimation, is 60 to 65%. The left ventricle has normal function. The left ventricle has no regional wall motion abnormalities. Left ventricular diastolic parameters were normal.  2. Right ventricular systolic function is normal. The right ventricular size is normal. There is mildly elevated pulmonary artery systolic pressure.  3. The mitral valve is grossly normal. Trivial mitral valve regurgitation. No evidence of mitral stenosis.  4. The aortic valve is normal in structure. Aortic valve regurgitation is not visualized. No aortic stenosis is present.  5. The inferior vena cava is normal in size with greater than 50% respiratory variability, suggesting right atrial pressure of 3 mmHg. Comparison(s): No prior Echocardiogram. FINDINGS  Left Ventricle: Left ventricular ejection fraction, by estimation, is 60 to 65%. The left ventricle has normal function. The left ventricle has no regional wall motion abnormalities. The left ventricular internal cavity size was normal in size. There is  borderline left ventricular hypertrophy. Left ventricular diastolic parameters were normal. Right Ventricle: The right ventricular size is normal. No increase in right ventricular wall thickness. Right ventricular systolic function is normal. There is mildly elevated pulmonary artery systolic pressure. The tricuspid regurgitant velocity is 2.88  m/s, and with an assumed right atrial pressure of 3 mmHg, the estimated right ventricular systolic pressure is 01.7 mmHg. Left Atrium: Left atrial size was normal in size. Right Atrium: Right atrial size was normal in size. Pericardium: There is no evidence of pericardial effusion. Mitral Valve: The mitral  valve is grossly normal. Trivial mitral valve regurgitation. No evidence of mitral valve stenosis. MV peak gradient, 7.5  mmHg. The mean mitral valve gradient is 4.0 mmHg. Tricuspid Valve: The tricuspid valve is grossly normal. Tricuspid valve regurgitation is trivial. No evidence of tricuspid stenosis. Aortic Valve: The aortic valve is normal in structure. Aortic valve regurgitation is not visualized. No aortic stenosis is present. Aortic valve mean gradient measures 4.0 mmHg. Aortic valve peak gradient measures 8.2 mmHg. Aortic valve area, by VTI measures 2.31 cm. Pulmonic Valve: The pulmonic valve was not well visualized. Pulmonic valve regurgitation is not visualized. No evidence of pulmonic stenosis. Aorta: The aortic root is normal in size and structure. Venous: The inferior vena cava is normal in size with greater than 50% respiratory variability, suggesting right atrial pressure of 3 mmHg. IAS/Shunts: No atrial level shunt detected by color flow Doppler.  LEFT VENTRICLE PLAX 2D LVIDd:         5.20 cm   Diastology LVIDs:         3.40 cm   LV e' medial:    14.40 cm/s LV PW:         1.20 cm   LV E/e' medial:  9.6 LV IVS:        1.20 cm   LV e' lateral:   13.40 cm/s LVOT diam:     2.00 cm   LV E/e' lateral: 10.3 LV SV:         71 LV SV Index:   27 LVOT Area:     3.14 cm  RIGHT VENTRICLE RV Basal diam:  3.40 cm RV Mid diam:    3.00 cm RV S prime:     16.30 cm/s TAPSE (M-mode): 3.2 cm LEFT ATRIUM             Index        RIGHT ATRIUM           Index LA diam:        4.60 cm 1.77 cm/m   RA Area:     21.30 cm LA Vol (A2C):   85.0 ml 32.63 ml/m  RA Volume:   69.10 ml  26.53 ml/m LA Vol (A4C):   70.8 ml 27.18 ml/m LA Biplane Vol: 83.8 ml 32.17 ml/m  AORTIC VALVE                    PULMONIC VALVE AV Area (Vmax):    2.26 cm     PV Vmax:       1.08 m/s AV Area (Vmean):   2.18 cm     PV Peak grad:  4.7 mmHg AV Area (VTI):     2.31 cm AV Vmax:           143.00 cm/s AV Vmean:          97.800 cm/s AV VTI:            0.308 m AV Peak Grad:      8.2 mmHg AV Mean Grad:      4.0 mmHg LVOT Vmax:         103.00 cm/s LVOT Vmean:        68.000  cm/s LVOT VTI:          0.226 m LVOT/AV VTI ratio: 0.73  AORTA Ao Root diam: 3.30 cm MITRAL VALVE                TRICUSPID VALVE MV Area (PHT): 4.04 cm  TR Peak grad:   33.2 mmHg MV Area VTI:   2.09 cm     TR Vmax:        288.00 cm/s MV Peak grad:  7.5 mmHg MV Mean grad:  4.0 mmHg     SHUNTS MV Vmax:       1.37 m/s     Systemic VTI:  0.23 m MV Vmean:      91.7 cm/s    Systemic Diam: 2.00 cm MV Decel Time: 188 msec MV E velocity: 138.00 cm/s MV A velocity: 91.70 cm/s MV E/A ratio:  1.50 Vishnu Priya Mallipeddi Electronically signed by Lorelee Cover Mallipeddi Signature Date/Time: 06/15/2022/10:53:48 AM    Final    DG Chest Port 1 View  Result Date: 06/15/2022 CLINICAL DATA:  Bilateral leg swelling.  Chest pain. EXAM: PORTABLE CHEST 1 VIEW COMPARISON:  06/14/2022 FINDINGS: 0516 hours. Low volume film. The cardio pericardial silhouette is enlarged. There is pulmonary vascular congestion without overt pulmonary edema. Bibasilar atelectasis/infiltrate noted, left greater than right with small bilateral pleural effusions, similar to prior. Telemetry leads overlie the chest. IMPRESSION: Low volume film with bibasilar atelectasis/infiltrate and small bilateral pleural effusions, left greater than right. Electronically Signed   By: Misty Stanley M.D.   On: 06/15/2022 05:39   CT Renal Stone Study  Result Date: 06/14/2022 CLINICAL DATA:  Swelling low hemoglobin elevated creatinine EXAM: CT ABDOMEN AND PELVIS WITHOUT CONTRAST TECHNIQUE: Multidetector CT imaging of the abdomen and pelvis was performed following the standard protocol without IV contrast. RADIATION DOSE REDUCTION: This exam was performed according to the departmental dose-optimization program which includes automated exposure control, adjustment of the mA and/or kV according to patient size and/or use of iterative reconstruction technique. COMPARISON:  CT 08/08/2011 FINDINGS: Lower chest: Lung bases demonstrate small bilateral pleural effusions.  Probable passive atelectasis in the left lower lobe. Upper normal cardiac size. Hepatobiliary: No focal liver abnormality is seen. No gallstones, gallbladder wall thickening, or biliary dilatation. Pancreas: Unremarkable. No pancreatic ductal dilatation or surrounding inflammatory changes. Spleen: Enlarged up to 16.3 cm. Adrenals/Urinary Tract: Adrenal glands are unremarkable. Kidneys are normal, without renal calculi, focal lesion, or hydronephrosis. Bladder is unremarkable. Stomach/Bowel: Stomach is within normal limits. Appendix appears normal. No evidence of bowel wall thickening, distention, or inflammatory changes. Vascular/Lymphatic: No significant vascular findings are present. No enlarged abdominal or pelvic lymph nodes. Reproductive: Uterus and bilateral adnexa are unremarkable. Other: Negative for pelvic effusion or free air. Small fat containing umbilical hernia. Edema within the subcutaneous soft tissues of the abdominal wall. Musculoskeletal: No acute osseous abnormality. IMPRESSION: 1. No CT evidence for acute intra-abdominal or pelvic abnormality. 2. Small bilateral pleural effusions. Mild diffuse subcutaneous edema. 3. Spleen appears slightly enlarged Electronically Signed   By: Donavan Foil M.D.   On: 06/14/2022 22:14   DG Chest 2 View  Result Date: 06/14/2022 CLINICAL DATA:  Chest pain EXAM: CHEST - 2 VIEW COMPARISON:  09/23/2018 FINDINGS: Small bilateral pleural effusions. Diffuse increased interstitial opacity could reflect low-grade edema. Patchy airspace opacity at the bases. Normal cardiac size. No pneumothorax IMPRESSION: Small bilateral pleural effusions with patchy airspace disease at the bases which may be due to atelectasis or pneumonia. Diffuse increased interstitial opacity could reflect low-grade edema. Electronically Signed   By: Donavan Foil M.D.   On: 06/14/2022 19:59    Labs:  CBC: Recent Labs    06/16/22 0340 06/17/22 0548 06/18/22 0527 06/19/22 0445  WBC 11.6*  11.7* 12.0* 12.2*  HGB 7.2* 7.7* 7.9* 8.3*  HCT 24.7* 26.3* 27.0* 28.1*  PLT 281 295 296 292    COAGS: Recent Labs    06/18/22 0527  INR 1.2    BMP: Recent Labs    06/16/22 0340 06/17/22 0548 06/18/22 0528 06/19/22 0445  Peterson 139 141 140 139  K 5.3* 4.7 3.8 3.9  CL 111 108 104 102  CO2 18* 21* 24 25  GLUCOSE 166* 106* 127* 119*  BUN 84* 79* 76* 74*  CALCIUM 7.1* 7.3* 7.3* 7.1*  CREATININE 4.15* 3.90* 3.77* 3.99*  GFRNONAA 14* 15* 15* 14*    LIVER FUNCTION TESTS: Recent Labs    06/14/22 2119 06/15/22 0832 06/16/22 0340 06/17/22 0548 06/18/22 0528 06/19/22 0445  BILITOT 0.4 0.4  --   --   --   --   AST 16 12*  --   --   --   --   ALT 12 11  --   --   --   --   ALKPHOS 96 80  --   --   --   --   PROT 7.0 6.3*  --   --   --   --   ALBUMIN 3.0* 2.6* 2.7* 2.9* 3.0* 2.9*    TUMOR MARKERS: No results for input(s): "AFPTM", "CEA", "CA199", "CHROMGRNA" in the last 8760 hours.  Assessment and Plan:  Acute kidney injury with proteinuria, hematuria and elevated creatinine: [name]. [name], 36 year old female, is scheduled today for an image-guided non-focal renal biopsy. She is to arrive at [facility] this morning via [facility] and she will return to [facility] post-procedure.   Risks and benefits of this procedure were discussed with the patient and/or patient's family including, but not limited to bleeding, infection, damage to adjacent structures or low yield requiring additional tests.  All of the questions were answered and there is agreement to proceed. She has been NPO. Labs and vitals have been reviewed. She has not received an blood-thinning medications in the past few days  - she was previously receiving subcutaneous heparin.   Consent signed and in chart.  Thank you for this interesting consult.  I greatly enjoyed meeting [name] and look forward to participating in their care.  A copy of this report was sent to the requesting provider on this  date.  Electronically Signed: Soyla Dryer, AGACNP-BC [phone] 06/19/2022, 8:24 AM   I spent a total of 20 Minutes    in face to face in clinical consultation, greater than 50% of which was counseling/coordinating care for non-focal renal biopsy

## 2022-06-19 NOTE — Sedation Documentation (Signed)
Pt transferred back to [facility] by [facility]. Awake and alert. In no distress

## 2022-06-19 NOTE — Procedures (Signed)
Interventional Radiology Procedure Note  Procedure: CT guided random renal biopsy  Indication: Renal failure  Findings: Please refer to procedural dictation for full description.  Complications: None  EBL: < 10 mL  Miachel Roux, MD [phone]

## 2022-06-19 NOTE — Progress Notes (Signed)
PROGRESS NOTE    [name]  YKD:[id number] DOB: 1986/04/27 DOA: 06/14/2022 PCP: Patient, No Pcp Per    Brief Narrative:  35 year old female, admitted to the hospital with dyspnea on exertion.  Found to have elevated creatinine of 4.0.  She is noted to have proteinuria.  She was also anemic with a hemoglobin of 6.7.  She was transfused PRBC.  Nephrology following.  She is undergoing further work-up for renal failure as well as attempted diuresis.  Plans are for renal biopsy on 11/13.   Assessment & Plan:   Principal Problem:   Symptomatic anemia Active Problems:   AKI (acute kidney injury) (HCC)   Hyperkalemia   HTN (hypertension)   UTI (urinary tract infection)   Peripheral edema   Symptomatic iron deficiency anemia -Possibly related to blood loss from patient menses -Hemoglobin was down to 6.7 on admission -She has received 1 unit of PRBC, second unit was discontinued due to development of itching -She also received iron infusion as well as Aranesp -Follow-up hemoglobin has been stable; currently 8.3 -Haptoglobin is not significantly low -no overt bleeding appreciated.  Possible AKI -No prior labs for comparison -Creatinine currently elevated at 3.99 -Nephrology following, appreciate input -Renal ultrasound shows normal-sized kidneys -Further work-up including ANCA, complement levels, anti-double-stranded DNA antibody currently in process -Sed rate 68 -Anti-GBM antibody negative -ANA positive, will check reflex panel -ASO titers positive -Concerns for presume Lupus -Urine protein is elevated -Plans for renal biopsy later today 11/13 -will follow nephrology rec's and decision for treatment.  Hyperkalemia -Trending down with Lasix -Continue to follow electrolytes trend.  Hyperphosphatemia -Started on ferric citrate -  Hypoalbuminemia -Although she does have proteinuria, does not appear to be nephrotic range  Volume overload -Continue high-dose IV Lasix  as part of treatment for anasarca. -Continue to monitor urine output -Echocardiogram shows normal ejection fraction -Follow further recommendations by nephrology service.  Hypertension -Continue to trend blood pressures with diuresis -She is not on any chronic meds prior to admission -Heart healthy/low-sodium discussed with patient.  Obesity, class III -Body mass index is 48.28 kg/m.  -Continue to monitor weight with diuresis -Low calorie diet, portion control and increase physical activity discussed with patient.   DVT prophylaxis: SCDs Start: 06/15/22 0059  Code Status: full code Family Communication: No family at bedside. Disposition Plan: Status is: Inpatient  Remains inpatient appropriate because: Further management of volume overload and work-up of renal failure   Consultants:  Nephrology  Procedures:  Planned kidney biopsy 06/19/22  Antimicrobials:  None    Subjective: No fever, no nausea, no vomiting.  Reported improvement in her urine output.  Overall dyspnea and short winded sensation with activity are getting better.  There is still signs of fluid overload.  Objective: Vitals:   06/18/22 1510 06/18/22 2013 06/18/22 2342 06/19/22 0536  BP: (!) 157/82 (!) 147/72 (!) 154/79 (!) 168/96  Pulse: 87 88 89 87  Resp:  18  20  Temp: 97.8 F (36.6 C) 98 F (36.7 C)  97.8 F (36.6 C)  TempSrc: Oral     SpO2: 97% 96%  95%  Weight:    (!) 148.3 kg  Height:        Intake/Output Summary (Last 24 hours) at 06/19/2022 1141 Last data filed at 06/19/2022 0500 Gross per 24 hour  Intake 480 ml  Output 400 ml  Net 80 ml   Filed Weights   06/17/22 0559 06/18/22 0447 06/19/22 0536  Weight: (!) 156.8 kg (!) 151.8 kg Marland Kitchen)  148.3 kg    Examination: General exam: Alert, awake, oriented x 3; no overnight events.  No fever, no nausea, no vomiting. Respiratory system: Clear to auscultation. Respiratory effort normal.  No using accessory muscle. Cardiovascular  system:RRR. No rubs or gallops; no JVD appreciated on exam. Gastrointestinal system: Abdomen is obese, nondistended, soft and nontender. No organomegaly or masses felt. Normal bowel sounds heard. Central nervous system: Alert and oriented. No focal neurological deficits. Extremities: No cyanosis or clubbing; 2-3+ edema appreciated bilaterally. Skin: No petechiae. Psychiatry: Judgement and insight appear normal. Mood & affect appropriate.   Data Reviewed: I have personally reviewed following labs and imaging studies  CBC: Recent Labs  Lab 06/15/22 0814 06/16/22 0340 06/17/22 0548 06/18/22 0527 06/19/22 0445  WBC 11.1* 11.6* 11.7* 12.0* 12.2*  NEUTROABS 7.5  --   --   --   --   HGB 7.0* 7.2* 7.7* 7.9* 8.3*  HCT 23.6* 24.7* 26.3* 27.0* 28.1*  MCV 73.3* 73.7* 73.7* 73.4* 74.7*  PLT 271 281 295 296 606   Basic Metabolic Panel: Recent Labs  Lab 06/15/22 0832 06/15/22 1154 06/15/22 1520 06/16/22 0340 06/17/22 0548 06/18/22 0528 06/19/22 0445  NA 138  --   --  139 141 140 139  K 5.5*   < > 5.6* 5.3* 4.7 3.8 3.9  CL 110  --   --  111 108 104 102  CO2 18*  --   --  18* 21* 24 25  GLUCOSE 132*  --   --  166* 106* 127* 119*  BUN 88*  --   --  84* 79* 76* 74*  CREATININE 4.07*  --   --  4.15* 3.90* 3.77* 3.99*  CALCIUM 7.1*  --   --  7.1* 7.3* 7.3* 7.1*  MG 2.6*  --   --   --   --   --   --   PHOS  --   --   --  9.1* 9.4* 10.0* 9.1*   < > = values in this interval not displayed.   GFR: Estimated Creatinine Clearance: 30.5 mL/min (A) (by C-G formula based on SCr of 3.99 mg/dL (H)).  Liver Function Tests: Recent Labs  Lab 06/14/22 2119 06/15/22 0832 06/16/22 0340 06/17/22 0548 06/18/22 0528 06/19/22 0445  AST 16 12*  --   --   --   --   ALT 12 11  --   --   --   --   ALKPHOS 96 80  --   --   --   --   BILITOT 0.4 0.4  --   --   --   --   PROT 7.0 6.3*  --   --   --   --   ALBUMIN 3.0* 2.6* 2.7* 2.9* 3.0* 2.9*   Coagulation Profile: Recent Labs  Lab 06/18/22 0527   INR 1.2   Sepsis Labs:  Recent Results (from the past 240 hour(s))  Urine Culture     Status: Abnormal   Collection Time: 06/15/22  1:20 AM   Specimen: Urine, Clean Catch  Result Value Ref Range Status   Specimen Description   Final    URINE, CLEAN CATCH Performed at [facility], [address] [place], [place] [address]    Special Requests   Final    NONE Performed at [facility], [address], [place],  [address]    Culture (A)  Final    10,000 COLONIES/mL GROUP B STREP(S.AGALACTIAE)ISOLATED TESTING AGAINST S. AGALACTIAE NOT ROUTINELY  PERFORMED DUE TO PREDICTABILITY OF AMP/PEN/VAN SUSCEPTIBILITY. Performed at [facility], [place] [address], [place], [place] [address]    Report Status 06/16/2022 FINAL  Final     Radiology Studies: No results found.   Scheduled Meds:  ferric citrate  420 mg Oral TID WC   ferrous sulfate  325 mg Oral BID WC   Continuous Infusions:  furosemide 160 mg (06/19/22 0539)     LOS: 5 days    Time spent: 35 mins    Barton Dubois, MD [facility]   If 7PM-7AM, please contact night-coverage [email or web address]  06/19/2022, 11:41 AM

## 2022-06-19 NOTE — Sedation Documentation (Addendum)
Report called to inpatient nurse, Val at [facility].

## 2022-06-19 NOTE — Progress Notes (Addendum)
Patient ID: Carolynne Edouard, female   DOB: May 31, 1986, 36 y.o.   MRN: [id number] S: Tolerated kidney biopsy well, however she ate after the procedure and developed N/V. O:BP (!) 168/96 (BP Location: Right Arm)   Pulse 87   Temp 97.8 F (36.6 C)   Resp 20   Ht 5\' 9"  (1.753 m)   Wt (!) 148.3 kg   LMP 06/14/2022   SpO2 95%   BMI 48.28 kg/m   Intake/Output Summary (Last 24 hours) at 06/19/2022 1232 Last data filed at 06/19/2022 0500 Gross per 24 hour  Intake 480 ml  Output 400 ml  Net 80 ml   Intake/Output: I/O last 3 completed shifts: In: 1081.7 [P.O.:957; IV Piggyback:124.7] Out: 1700 [Urine:1700]  Intake/Output this shift:  No intake/output data recorded. Weight change: -3.5 kg Gen: obese, NAD CVS: RRR Resp:CTA Abd: +Bs, soft, NT/ND Ext: no edema  Recent Labs  Lab 06/14/22 1949 06/14/22 2119 06/15/22 0832 06/15/22 1154 06/15/22 1520 06/16/22 0340 06/17/22 0548 06/18/22 0528 06/19/22 0445  NA 139  --  138  --   --  139 141 140 139  K 5.3* 5.7* 5.5* 5.3* 5.6* 5.3* 4.7 3.8 3.9  CL 110  --  110  --   --  111 108 104 102  CO2 20*  --  18*  --   --  18* 21* 24 25  GLUCOSE 123*  --  132*  --   --  166* 106* 127* 119*  BUN 88*  --  88*  --   --  84* 79* 76* 74*  CREATININE 4.00*  --  4.07*  --   --  4.15* 3.90* 3.77* 3.99*  ALBUMIN  --  3.0* 2.6*  --   --  2.7* 2.9* 3.0* 2.9*  CALCIUM 7.6*  --  7.1*  --   --  7.1* 7.3* 7.3* 7.1*  PHOS  --   --   --   --   --  9.1* 9.4* 10.0* 9.1*  AST  --  16 12*  --   --   --   --   --   --   ALT  --  12 11  --   --   --   --   --   --    Liver Function Tests: Recent Labs  Lab 06/14/22 2119 06/15/22 0832 06/16/22 0340 06/17/22 0548 06/18/22 0528 06/19/22 0445  AST 16 12*  --   --   --   --   ALT 12 11  --   --   --   --   ALKPHOS 96 80  --   --   --   --   BILITOT 0.4 0.4  --   --   --   --   PROT 7.0 6.3*  --   --   --   --   ALBUMIN 3.0* 2.6*   < > 2.9* 3.0* 2.9*   < > = values in this interval not displayed.   No  results for input(s): "LIPASE", "AMYLASE" in the last 168 hours. No results for input(s): "AMMONIA" in the last 168 hours. CBC: Recent Labs  Lab 06/15/22 0814 06/16/22 0340 06/17/22 0548 06/18/22 0527 06/19/22 0445  WBC 11.1* 11.6* 11.7* 12.0* 12.2*  NEUTROABS 7.5  --   --   --   --   HGB 7.0* 7.2* 7.7* 7.9* 8.3*  HCT 23.6* 24.7* 26.3* 27.0* 28.1*  MCV 73.3* 73.7* 73.7* 73.4* 74.7*  PLT  271 281 295 296 292   Cardiac Enzymes: No results for input(s): "CKTOTAL", "CKMB", "CKMBINDEX", "TROPONINI" in the last 168 hours. CBG: No results for input(s): "GLUCAP" in the last 168 hours.  Iron Studies: No results for input(s): "IRON", "TIBC", "TRANSFERRIN", "FERRITIN" in the last 72 hours. Studies/Results: CT RENAL BIOPSY  Result Date: 06/19/2022 INDICATION: Proteinuria Hematuria Renal failure EXAM: CT-guided random renal biopsy TECHNIQUE: Multidetector CT imaging of the abdomen was performed following the standard protocol without IV contrast. RADIATION DOSE REDUCTION: This exam was performed according to the departmental dose-optimization program which includes automated exposure control, adjustment of the mA and/or kV according to patient size and/or use of iterative reconstruction technique. MEDICATIONS: None. ANESTHESIA/SEDATION: Moderate (conscious) sedation was employed during this procedure. A total of Versed 1 mg and Fentanyl 50 mcg was administered intravenously by the radiology nurse. Total intra-service moderate Sedation Time: 15 minutes. The patient's level of consciousness and vital signs were monitored continuously by radiology nursing throughout the procedure under my direct supervision. COMPLICATIONS: None immediate. PROCEDURE: Informed written consent was obtained from the patient after a thorough discussion of the procedural risks, benefits and alternatives. All questions were addressed. Maximal Sterile Barrier Technique was utilized including caps, mask, sterile gowns, sterile  gloves, sterile drape, hand hygiene and skin antiseptic. A timeout was performed prior to the initiation of the procedure. Patient positioned prone on the CT table. The left flank skin prepped and draped usual fashion. Following local administration, 17 gauge introducer needle was advanced into the lower pole of the left kidney and 2-18 gauge cores were obtained. All samples sent to pathology in sterile saline. Gel-Foam slurry was administered through the 17 gauge guiding needle. 5 minutes of manual compression was applied at the biopsy site for hemostasis. Post biopsy CT showed no significant hemorrhage. IMPRESSION: CT-guided random renal biopsy. Electronically Signed   By: Miachel Roux M.D.   On: 06/19/2022 12:22    ferric citrate  420 mg Oral TID WC   ferrous sulfate  325 mg Oral BID WC    BMET    Component Value Date/Time   NA 139 06/19/2022 0445   K 3.9 06/19/2022 0445   CL 102 06/19/2022 0445   CO2 25 06/19/2022 0445   GLUCOSE 119 (H) 06/19/2022 0445   GLUCOSE 88 05/15/2014 0758   BUN 74 (H) 06/19/2022 0445   CREATININE 3.99 (H) 06/19/2022 0445   CREATININE 0.58 03/06/2014 0743   CALCIUM 7.1 (L) 06/19/2022 0445   GFRNONAA 14 (L) 06/19/2022 0445   GFRAA >60 12/13/2015 1155   CBC    Component Value Date/Time   WBC 12.2 (H) 06/19/2022 0445   RBC 3.76 (L) 06/19/2022 0445   HGB 8.3 (L) 06/19/2022 0445   HGB 11.5 05/31/2015 0826   HCT 28.1 (L) 06/19/2022 0445   HCT 36.5 05/31/2015 0826   PLT 292 06/19/2022 0445   PLT 221 05/31/2015 0826   MCV 74.7 (L) 06/19/2022 0445   MCV 79 05/31/2015 0826   MCH 22.1 (L) 06/19/2022 0445   MCHC 29.5 (L) 06/19/2022 0445   RDW 19.8 (H) 06/19/2022 0445   RDW 15.7 (H) 05/31/2015 0826   LYMPHSABS 2.1 06/15/2022 0814   MONOABS 1.2 (H) 06/15/2022 0814   EOSABS 0.2 06/15/2022 0814   BASOSABS 0.1 06/15/2022 0814    Assessment/Plan: 36 year old WF with no past medical history and no medications-  presents with a crt of 4-  proteinuria and  hematuria-  possible viral syndrome prodrome-  also anemia and  hypoalbuminemia  1.Renal- dont have any recent kidney lab values.  So dont know if this change in renal function is acute or been happening for the last 6 years. NL function in 2017; she has proteinuria, hematuria  and hypoalbuminemia so suspicious for a GN.  Quantifying protein-  (2.7 grams)  and checking serologies (ANCA pending; GBM neg; ANA and ASO positive low C3, normal C4); ANA titer in process as well as DSDNA. Renal ultrasound looks normal w/ no e/o obstruction.   s/p CT-guided kidney biopsy 06/18/22-  No empiric treatment yet as there is a possibility this has been happening for a while. No absolute indications for dialysis at this time. Renal function slowly improving.Marland Kitchen Hopefully will have results by Wednesday.  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 2. Hypertension/volume  - was overloaded-  was given lasix but with moderate response, but better response with  incr dose + metolazone. Great UOP initially but fell off over the past 24hrs and worsening of renal function as well. Wonder if there is a component of CRS.  Will need to have better bp control to prevent bleeding post biopsy.  Will resume labetalol 400 mg bid and follow bp response.  Goal SBP <160. 3. Anemia  - is severe and makes me think her CKD is not new-  has been transfused with not a great bump in hgb -  Ferrlecit 3/4 250mg  given; ESA.  Her platelets are OK; haptoglobin NL.  4. Renal osteodystrophy Start Auryxia 2 tabs TIDM -> will adjust and change as tolerated. 5. Potassium-  improved 6. Hypoalbuminemia-  could be due to nephrotic syndrome or just in the setting of recent viral illness and not eating.  Quantifying proteinuria will help determine which -  proteinuria not extreme at 2.7 grams-  maybe a combo of both   Donetta Potts, MD [facility]

## 2022-06-20 LAB — RENAL FUNCTION PANEL
Albumin: 3 g/dL — ABNORMAL LOW (ref 3.5–5.0)
Anion gap: 14 (ref 5–15)
BUN: 78 mg/dL — ABNORMAL HIGH (ref 6–20)
CO2: 27 mmol/L (ref 22–32)
Calcium: 7.5 mg/dL — ABNORMAL LOW (ref 8.9–10.3)
Chloride: 101 mmol/L (ref 98–111)
Creatinine, Ser: 4.19 mg/dL — ABNORMAL HIGH (ref 0.44–1.00)
GFR, Estimated: 13 mL/min — ABNORMAL LOW (ref >60)
Glucose, Bld: 125 mg/dL — ABNORMAL HIGH (ref 70–99)
Phosphorus: 9.9 mg/dL — ABNORMAL HIGH (ref 2.5–4.6)
Potassium: 3.9 mmol/L (ref 3.5–5.1)
Sodium: 142 mmol/L (ref 135–145)

## 2022-06-20 LAB — PROTEIN, URINE, 24 HOUR
Collection Interval-UPROT: 1675 hours
Protein, 24H Urine: 76 mg/d (ref 50–100)
Protein, Urine: 317 mg/dL
Urine Total Volume-UPROT: 1675 mL

## 2022-06-20 LAB — CBC
HCT: 28.5 % — ABNORMAL LOW (ref 36.0–46.0)
Hemoglobin: 8.4 g/dL — ABNORMAL LOW (ref 12.0–15.0)
MCH: 22.3 pg — ABNORMAL LOW (ref 26.0–34.0)
MCHC: 29.5 g/dL — ABNORMAL LOW (ref 30.0–36.0)
MCV: 75.8 fL — ABNORMAL LOW (ref 80.0–100.0)
Platelets: 300 10*3/uL (ref 150–400)
RBC: 3.76 MIL/uL — ABNORMAL LOW (ref 3.87–5.11)
RDW: 21.1 % — ABNORMAL HIGH (ref 11.5–15.5)
WBC: 13.7 10*3/uL — ABNORMAL HIGH (ref 4.0–10.5)
nRBC: 0 % (ref 0.0–0.2)

## 2022-06-20 NOTE — Progress Notes (Signed)
Patient ID: [name], female   DOB: February 01, 1986, 36 y.o.   MRN: [id number] S: Feeling much better today.  No complaints overnight. O:BP (!) 140/80 (BP Location: Right Arm)   Pulse 74   Temp 97.7 F (36.5 C) (Oral)   Resp 18   Ht 5\' 9"  (1.753 m)   Wt (!) 156.1 kg   LMP 06/14/2022   SpO2 94%   BMI 50.83 kg/m   Intake/Output Summary (Last 24 hours) at 06/20/2022 1059 Last data filed at 06/20/2022 0500 Gross per 24 hour  Intake 216 ml  Output 1000 ml  Net -784 ml   Intake/Output: I/O last 3 completed shifts: In: 216 [P.O.:100; IV Piggyback:116] Out: 1000 [Urine:1000]  Intake/Output this shift:  No intake/output data recorded. Weight change: 7.828 kg Gen: NAD CVS: RRR Resp:CTA Abd:+BS, soft, NT, obese Ext: no edema  Recent Labs  Lab 06/14/22 1949 06/14/22 2119 06/15/22 0832 06/15/22 1154 06/15/22 1520 06/16/22 0340 06/17/22 0548 06/18/22 0528 06/19/22 0445 06/20/22 0500  NA 139  --  138  --   --  139 141 140 139 142  K 5.3* 5.7* 5.5* 5.3* 5.6* 5.3* 4.7 3.8 3.9 3.9  CL 110  --  110  --   --  111 108 104 102 101  CO2 20*  --  18*  --   --  18* 21* 24 25 27   GLUCOSE 123*  --  132*  --   --  166* 106* 127* 119* 125*  BUN 88*  --  88*  --   --  84* 79* 76* 74* 78*  CREATININE 4.00*  --  4.07*  --   --  4.15* 3.90* 3.77* 3.99* 4.19*  ALBUMIN  --  3.0* 2.6*  --   --  2.7* 2.9* 3.0* 2.9* 3.0*  CALCIUM 7.6*  --  7.1*  --   --  7.1* 7.3* 7.3* 7.1* 7.5*  PHOS  --   --   --   --   --  9.1* 9.4* 10.0* 9.1* 9.9*  AST  --  16 12*  --   --   --   --   --   --   --   ALT  --  12 11  --   --   --   --   --   --   --    Liver Function Tests: Recent Labs  Lab 06/14/22 2119 06/15/22 0832 06/16/22 0340 06/18/22 0528 06/19/22 0445 06/20/22 0500  AST 16 12*  --   --   --   --   ALT 12 11  --   --   --   --   ALKPHOS 96 80  --   --   --   --   BILITOT 0.4 0.4  --   --   --   --   PROT 7.0 6.3*  --   --   --   --   ALBUMIN 3.0* 2.6*   < > 3.0* 2.9* 3.0*   < > = values in  this interval not displayed.   No results for input(s): "LIPASE", "AMYLASE" in the last 168 hours. No results for input(s): "AMMONIA" in the last 168 hours. CBC: Recent Labs  Lab 06/15/22 0814 06/16/22 0340 06/17/22 0548 06/18/22 0527 06/19/22 0445 06/20/22 0500  WBC 11.1* 11.6* 11.7* 12.0* 12.2* 13.7*  NEUTROABS 7.5  --   --   --   --   --   HGB 7.0* 7.2*  7.7* 7.9* 8.3* 8.4*  HCT 23.6* 24.7* 26.3* 27.0* 28.1* 28.5*  MCV 73.3* 73.7* 73.7* 73.4* 74.7* 75.8*  PLT 271 281 295 296 292 300   Cardiac Enzymes: No results for input(s): "CKTOTAL", "CKMB", "CKMBINDEX", "TROPONINI" in the last 168 hours. CBG: No results for input(s): "GLUCAP" in the last 168 hours.  Iron Studies: No results for input(s): "IRON", "TIBC", "TRANSFERRIN", "FERRITIN" in the last 72 hours. Studies/Results: CT RENAL BIOPSY  Result Date: 06/19/2022 INDICATION: Proteinuria Hematuria Renal failure EXAM: CT-guided random renal biopsy TECHNIQUE: Multidetector CT imaging of the abdomen was performed following the standard protocol without IV contrast. RADIATION DOSE REDUCTION: This exam was performed according to the departmental dose-optimization program which includes automated exposure control, adjustment of the mA and/or kV according to patient size and/or use of iterative reconstruction technique. MEDICATIONS: None. ANESTHESIA/SEDATION: Moderate (conscious) sedation was employed during this procedure. A total of Versed 1 mg and Fentanyl 50 mcg was administered intravenously by the radiology nurse. Total intra-service moderate Sedation Time: 15 minutes. The patient's level of consciousness and vital signs were monitored continuously by radiology nursing throughout the procedure under my direct supervision. COMPLICATIONS: None immediate. PROCEDURE: Informed written consent was obtained from the patient after a thorough discussion of the procedural risks, benefits and alternatives. All questions were addressed. Maximal  Sterile Barrier Technique was utilized including caps, mask, sterile gowns, sterile gloves, sterile drape, hand hygiene and skin antiseptic. A timeout was performed prior to the initiation of the procedure. Patient positioned prone on the CT table. The left flank skin prepped and draped usual fashion. Following local administration, 17 gauge introducer needle was advanced into the lower pole of the left kidney and 2-18 gauge cores were obtained. All samples sent to pathology in sterile saline. Gel-Foam slurry was administered through the 17 gauge guiding needle. 5 minutes of manual compression was applied at the biopsy site for hemostasis. Post biopsy CT showed no significant hemorrhage. IMPRESSION: CT-guided random renal biopsy. Electronically Signed   By: Miachel Roux M.D.   On: 06/19/2022 12:22    ferric citrate  420 mg Oral TID WC   ferrous sulfate  325 mg Oral BID WC   labetalol  400 mg Oral BID   pantoprazole  40 mg Oral Daily    BMET    Component Value Date/Time   NA 142 06/20/2022 0500   K 3.9 06/20/2022 0500   CL 101 06/20/2022 0500   CO2 27 06/20/2022 0500   GLUCOSE 125 (H) 06/20/2022 0500   GLUCOSE 88 05/15/2014 0758   BUN 78 (H) 06/20/2022 0500   CREATININE 4.19 (H) 06/20/2022 0500   CREATININE 0.58 03/06/2014 0743   CALCIUM 7.5 (L) 06/20/2022 0500   GFRNONAA 13 (L) 06/20/2022 0500   GFRAA >60 12/13/2015 1155   CBC    Component Value Date/Time   WBC 13.7 (H) 06/20/2022 0500   RBC 3.76 (L) 06/20/2022 0500   HGB 8.4 (L) 06/20/2022 0500   HGB 11.5 05/31/2015 0826   HCT 28.5 (L) 06/20/2022 0500   HCT 36.5 05/31/2015 0826   PLT 300 06/20/2022 0500   PLT 221 05/31/2015 0826   MCV 75.8 (L) 06/20/2022 0500   MCV 79 05/31/2015 0826   MCH 22.3 (L) 06/20/2022 0500   MCHC 29.5 (L) 06/20/2022 0500   RDW 21.1 (H) 06/20/2022 0500   RDW 15.7 (H) 05/31/2015 0826   LYMPHSABS 2.1 06/15/2022 0814   MONOABS 1.2 (H) 06/15/2022 0814   EOSABS 0.2 06/15/2022 0814   BASOSABS 0.1  06/15/2022 4010     Assessment/Plan: 36 year old WF with no past medical history and no medications-  presents with a crt of 4-  proteinuria and hematuria-  possible viral syndrome prodrome-  also anemia and hypoalbuminemia  Renal- dont have any recent kidney lab values.  So dont know if this change in renal function is acute or been happening for the last 6 years. NL function in 2017; she has proteinuria, hematuria  and hypoalbuminemia so suspicious for a GN.  Quantifying protein-  (2.7 grams)  and checking serologies (ANCA neg; GBM neg, dsDNA neg; ANA and ASO positive low C3, normal C4); ANA titer in process. Renal ultrasound looks normal w/ no e/o obstruction.   s/p CT-guided kidney biopsy 06/18/22-  No empiric treatment yet as there is a possibility this has been happening for a while. No absolute indications for dialysis at this time. Renal function slowly improving.Marland Kitchen Hopefully will have results by Wednesday.  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 Hypertension/volume  - was overloaded-  was given lasix but with moderate response, but better response with  incr dose + metolazone. Great UOP initially but fell off over the past 24hrs and worsening of renal function as well. Wonder if there is a component of CRS.  Will need to have better bp control to prevent bleeding post biopsy.  Will resume labetalol 400 mg bid and follow bp response.  Goal SBP <160. Anemia  - is severe and makes me think her CKD is not new-  has been transfused with not a great bump in hgb -  Ferrlecit 3/4 250mg  given; ESA.  Her platelets are OK; haptoglobin NL. Hgb stable after biopsy.  Continue to follow.  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 Potassium-  improved Hypoalbuminemia-  could be due to nephrotic syndrome or just in the setting of recent viral illness and not eating.  Quantifying proteinuria will help determine which -  proteinuria not  extreme at 2.7 grams-  maybe a combo of both but ordered 24 hour urine collection for protein.  Donetta Potts, MD [facility]

## 2022-06-20 NOTE — Progress Notes (Signed)
PROGRESS NOTE    DELANO SCARDINO  EXN:[id number] DOB: 06-19-1986 DOA: 06/14/2022 PCP: Patient, No Pcp Per    Brief Narrative:  36 year old female, admitted to the hospital with dyspnea on exertion.  Found to have elevated creatinine of 4.0.  She is noted to have proteinuria.  She was also anemic with a hemoglobin of 6.7.  She was transfused PRBC.  Nephrology following.  She is undergoing further work-up for renal failure as well as attempted diuresis.  Plans are for renal biopsy on 11/13.   Assessment & Plan:   Principal Problem:   Symptomatic anemia Active Problems:   AKI (acute kidney injury) (HCC)   Hyperkalemia   HTN (hypertension)   UTI (urinary tract infection)   Peripheral edema   Symptomatic iron deficiency anemia -Possibly related to blood loss from patient menses -Hemoglobin was down to 6.7 on admission -She has received 1 unit of PRBC, second unit was discontinued due to development of itching -She also received iron infusion as well as Aranesp -Follow-up hemoglobin has been stable; currently 8.3 -Haptoglobin is not significantly low -no overt bleeding appreciated.  Possible AKI -No prior labs for comparison -Creatinine currently elevated at 3.99 -Nephrology following, appreciate input -Renal ultrasound shows normal-sized kidneys -Further work-up including ANCA, complement levels, anti-double-stranded DNA antibody currently in process -Sed rate 68 -Anti-GBM antibody negative -ANA positive, will check reflex panel -ASO titers positive -Concerns for presume Lupus -Urine protein is elevated -S/P renal biopsy on 11/13; results pending. -will follow nephrology rec's and decision for treatment.  Hyperkalemia -Trending down with Lasix -Continue to follow electrolytes trend.  Hyperphosphatemia -Started on ferric citrate  Hypoalbuminemia -Although she does have proteinuria, does not appear to be nephrotic range  Volume overload -Continue high-dose IV Lasix  as part of treatment for anasarca. -Continue to monitor urine output -Echocardiogram shows normal ejection fraction -Follow further recommendations by nephrology service.  Hypertension -Continue to trend blood pressures with diuresis -She is not on any chronic meds prior to admission -Heart healthy/low-sodium discussed with patient.  Obesity, class III -Body mass index is 50.83 kg/m.  -Continue to monitor weight with diuresis -Low calorie diet, portion control and increase physical activity discussed with patient.  Type 2 diabetes -A1C 6.7 -life style changes discussed with patient -will recommend initiation of hypoglycemic regimen at discharge.   DVT prophylaxis: SCDs Start: 06/15/22 0059  Code Status: full code Family Communication: No family at bedside. Disposition Plan: Status is: Inpatient  Remains inpatient appropriate because: Further management of volume overload and work-up of renal failure   Consultants:  Nephrology  Procedures:  Planned kidney biopsy 06/19/22  Antimicrobials:  None    Subjective: No fever, no CP, no SOB, no nausea or vomiting currently. Reports good urine output.  Objective: Vitals:   06/19/22 1955 06/19/22 2158 06/20/22 0527 06/20/22 1243  BP: (!) 161/88 (!) 160/86 (!) 140/80 (!) 149/81  Pulse: 87  74 79  Resp: 18  18 18   Temp: 97.6 F (36.4 C)  97.7 F (36.5 C) 97.7 F (36.5 C)  TempSrc: Oral  Oral Oral  SpO2: 99%  94% 94%  Weight:   (!) 156.1 kg   Height:        Intake/Output Summary (Last 24 hours) at 06/20/2022 1740 Last data filed at 06/20/2022 1500 Gross per 24 hour  Intake 1176 ml  Output 800 ml  Net 376 ml   Filed Weights   06/18/22 0447 06/19/22 0536 06/20/22 0527  Weight: (!) 151.8 kg (!) 148.3 kg Marland Kitchen)  156.1 kg    Examination: General exam: Alert, awake, oriented x 3; no overnight events, reports no nausea, no vomiting. Good urine output  Respiratory system: Clear to auscultation. Respiratory effort  normal. Good saturation. Cardiovascular system:RRR. No rubs or gallops. Gastrointestinal system: Abdomen is obese, nondistended, soft and nontender. No organomegaly or masses felt. Normal bowel sounds heard. Central nervous system: Alert and oriented. No focal neurological deficits. Extremities: No Cyanosis, no clubbing; 2-3+ edema. Skin: No petechiae. Psychiatry: Judgement and insight appear normal. Mood & affect appropriate.   Data Reviewed: I have personally reviewed following labs and imaging studies  CBC: Recent Labs  Lab 06/15/22 0814 06/16/22 0340 06/17/22 0548 06/18/22 0527 06/19/22 0445 06/20/22 0500  WBC 11.1* 11.6* 11.7* 12.0* 12.2* 13.7*  NEUTROABS 7.5  --   --   --   --   --   HGB 7.0* 7.2* 7.7* 7.9* 8.3* 8.4*  HCT 23.6* 24.7* 26.3* 27.0* 28.1* 28.5*  MCV 73.3* 73.7* 73.7* 73.4* 74.7* 75.8*  PLT 271 281 295 296 292 409   Basic Metabolic Panel: Recent Labs  Lab 06/15/22 0832 06/15/22 1154 06/16/22 0340 06/17/22 0548 06/18/22 0528 06/19/22 0445 06/20/22 0500  NA 138  --  139 141 140 139 142  K 5.5*   < > 5.3* 4.7 3.8 3.9 3.9  CL 110  --  111 108 104 102 101  CO2 18*  --  18* 21* 24 25 27   GLUCOSE 132*  --  166* 106* 127* 119* 125*  BUN 88*  --  84* 79* 76* 74* 78*  CREATININE 4.07*  --  4.15* 3.90* 3.77* 3.99* 4.19*  CALCIUM 7.1*  --  7.1* 7.3* 7.3* 7.1* 7.5*  MG 2.6*  --   --   --   --   --   --   PHOS  --   --  9.1* 9.4* 10.0* 9.1* 9.9*   < > = values in this interval not displayed.   GFR: Estimated Creatinine Clearance: 29.9 mL/min (A) (by C-G formula based on SCr of 4.19 mg/dL (H)).  Liver Function Tests: Recent Labs  Lab 06/14/22 2119 06/15/22 0832 06/16/22 0340 06/17/22 0548 06/18/22 0528 06/19/22 0445 06/20/22 0500  AST 16 12*  --   --   --   --   --   ALT 12 11  --   --   --   --   --   ALKPHOS 96 80  --   --   --   --   --   BILITOT 0.4 0.4  --   --   --   --   --   PROT 7.0 6.3*  --   --   --   --   --   ALBUMIN 3.0* 2.6* 2.7*  2.9* 3.0* 2.9* 3.0*   Coagulation Profile: Recent Labs  Lab 06/18/22 0527  INR 1.2   Sepsis Labs:  Recent Results (from the past 240 hour(s))  Urine Culture     Status: Abnormal   Collection Time: 06/15/22  1:20 AM   Specimen: Urine, Clean Catch  Result Value Ref Range Status   Specimen Description   Final    URINE, CLEAN CATCH Performed at [facility], [address] [place], [place] [address]    Special Requests   Final    NONE Performed at [facility], [address] [place], [place] [address]    Culture (A)  Final    10,000 COLONIES/mL GROUP B STREP(S.AGALACTIAE)ISOLATED TESTING AGAINST  S. AGALACTIAE NOT ROUTINELY PERFORMED DUE TO PREDICTABILITY OF AMP/PEN/VAN SUSCEPTIBILITY. Performed at [facility], [place] [address] [place], [place] [address]    Report Status 06/16/2022 FINAL  Final     Radiology Studies: CT RENAL BIOPSY  Result Date: 06/19/2022 INDICATION: Proteinuria Hematuria Renal failure EXAM: CT-guided random renal biopsy TECHNIQUE: Multidetector CT imaging of the abdomen was performed following the standard protocol without IV contrast. RADIATION DOSE REDUCTION: This exam was performed according to the departmental dose-optimization program which includes automated exposure control, adjustment of the mA and/or kV according to patient size and/or use of iterative reconstruction technique. MEDICATIONS: None. ANESTHESIA/SEDATION: Moderate (conscious) sedation was employed during this procedure. A total of Versed 1 mg and Fentanyl 50 mcg was administered intravenously by the radiology nurse. Total intra-service moderate Sedation Time: 15 minutes. The patient's level of consciousness and vital signs were monitored continuously by radiology nursing throughout the procedure under my direct supervision. COMPLICATIONS: None immediate. PROCEDURE: Informed written consent was obtained from the patient after a thorough discussion of the procedural risks, benefits and  alternatives. All questions were addressed. Maximal Sterile Barrier Technique was utilized including caps, mask, sterile gowns, sterile gloves, sterile drape, hand hygiene and skin antiseptic. A timeout was performed prior to the initiation of the procedure. Patient positioned prone on the CT table. The left flank skin prepped and draped usual fashion. Following local administration, 17 gauge introducer needle was advanced into the lower pole of the left kidney and 2-18 gauge cores were obtained. All samples sent to pathology in sterile saline. Gel-Foam slurry was administered through the 17 gauge guiding needle. 5 minutes of manual compression was applied at the biopsy site for hemostasis. Post biopsy CT showed no significant hemorrhage. IMPRESSION: CT-guided random renal biopsy. Electronically Signed   By: Miachel Roux M.D.   On: 06/19/2022 12:22    Scheduled Meds:  ferric citrate  420 mg Oral TID WC   ferrous sulfate  325 mg Oral BID WC   labetalol  400 mg Oral BID   pantoprazole  40 mg Oral Daily   Continuous Infusions:  furosemide 160 mg (06/20/22 1650)     LOS: 6 days    Time spent: 35 mins    Barton Dubois, MD [facility]   If 7PM-7AM, please contact night-coverage [email or web address]  06/20/2022, 5:40 PM

## 2022-06-21 ENCOUNTER — Encounter (HOSPITAL_COMMUNITY): Payer: Self-pay

## 2022-06-21 LAB — RENAL FUNCTION PANEL
Albumin: 3 g/dL — ABNORMAL LOW (ref 3.5–5.0)
Anion gap: 14 (ref 5–15)
BUN: 77 mg/dL — ABNORMAL HIGH (ref 6–20)
CO2: 27 mmol/L (ref 22–32)
Calcium: 7.5 mg/dL — ABNORMAL LOW (ref 8.9–10.3)
Chloride: 99 mmol/L (ref 98–111)
Creatinine, Ser: 4.48 mg/dL — ABNORMAL HIGH (ref 0.44–1.00)
GFR, Estimated: 12 mL/min — ABNORMAL LOW (ref >60)
Glucose, Bld: 120 mg/dL — ABNORMAL HIGH (ref 70–99)
Phosphorus: 10.2 mg/dL — ABNORMAL HIGH (ref 2.5–4.6)
Potassium: 3.8 mmol/L (ref 3.5–5.1)
Sodium: 140 mmol/L (ref 135–145)

## 2022-06-21 LAB — URINALYSIS, ROUTINE W REFLEX MICROSCOPIC
Bilirubin Urine: NEGATIVE
Glucose, UA: NEGATIVE mg/dL
Ketones, ur: NEGATIVE mg/dL
Nitrite: NEGATIVE
Protein, ur: >=300 mg/dL — AB
Specific Gravity, Urine: 1.01 (ref 1.005–1.030)
pH: 6 (ref 5.0–8.0)

## 2022-06-21 LAB — CBC
HCT: 27.9 % — ABNORMAL LOW (ref 36.0–46.0)
Hemoglobin: 8.1 g/dL — ABNORMAL LOW (ref 12.0–15.0)
MCH: 22 pg — ABNORMAL LOW (ref 26.0–34.0)
MCHC: 29 g/dL — ABNORMAL LOW (ref 30.0–36.0)
MCV: 75.8 fL — ABNORMAL LOW (ref 80.0–100.0)
Platelets: 287 10*3/uL (ref 150–400)
RBC: 3.68 MIL/uL — ABNORMAL LOW (ref 3.87–5.11)
RDW: 21.2 % — ABNORMAL HIGH (ref 11.5–15.5)
WBC: 12.1 10*3/uL — ABNORMAL HIGH (ref 4.0–10.5)
nRBC: 0 % (ref 0.0–0.2)

## 2022-06-21 LAB — SURGICAL PATHOLOGY

## 2022-06-21 LAB — SODIUM, URINE, RANDOM: Sodium, Ur: 88 mmol/L

## 2022-06-21 MED ORDER — SODIUM CHLORIDE 0.9 % IV SOLN: INTRAVENOUS | Status: DC

## 2022-06-21 NOTE — Progress Notes (Signed)
Patient ID: Carolynne Edouard, female   DOB: [birth year], 36 y.o.   MRN: [id number] S: Had some N/V this morning. O:BP (!) 160/91 (BP Location: Right Arm)   Pulse 70   Temp 98 F (36.7 C)   Resp 18   Ht 5\' 9"  (1.753 m)   Wt (!) 144.8 kg   LMP 06/14/2022   SpO2 95%   BMI 47.14 kg/m   Intake/Output Summary (Last 24 hours) at 06/21/2022 1132 Last data filed at 06/21/2022 0717 Gross per 24 hour  Intake 968 ml  Output 1600 ml  Net -632 ml   Intake/Output: I/O last 3 completed shifts: In: 1466 [P.O.:1400; IV Piggyback:66] Out: 2000 [Urine:2000]  Intake/Output this shift:  Total I/O In: 148 [IV Piggyback:148] Out: -  Weight change: -11.3 kg Gen: NAD CVS: RRR Resp:CTA Abd: +BS, soft, NT/ND Ext: no edema  Recent Labs  Lab 06/14/22 2119 06/15/22 0832 06/15/22 1154 06/15/22 1520 06/16/22 0340 06/17/22 0548 06/18/22 0528 06/19/22 0445 06/20/22 0500 06/21/22 0431  NA  --  138  --   --  139 141 140 139 142 140  K 5.7* 5.5*   < > 5.6* 5.3* 4.7 3.8 3.9 3.9 3.8  CL  --  110  --   --  111 108 104 102 101 99  CO2  --  18*  --   --  18* 21* 24 25 27 27   GLUCOSE  --  132*  --   --  166* 106* 127* 119* 125* 120*  BUN  --  88*  --   --  84* 79* 76* 74* 78* 77*  CREATININE  --  4.07*  --   --  4.15* 3.90* 3.77* 3.99* 4.19* 4.48*  ALBUMIN 3.0* 2.6*  --   --  2.7* 2.9* 3.0* 2.9* 3.0* 3.0*  CALCIUM  --  7.1*  --   --  7.1* 7.3* 7.3* 7.1* 7.5* 7.5*  PHOS  --   --   --   --  9.1* 9.4* 10.0* 9.1* 9.9* 10.2*  AST 16 12*  --   --   --   --   --   --   --   --   ALT 12 11  --   --   --   --   --   --   --   --    < > = values in this interval not displayed.   Liver Function Tests: Recent Labs  Lab 06/14/22 2119 06/15/22 0832 06/16/22 0340 06/19/22 0445 06/20/22 0500 06/21/22 0431  AST 16 12*  --   --   --   --   ALT 12 11  --   --   --   --   ALKPHOS 96 80  --   --   --   --   BILITOT 0.4 0.4  --   --   --   --   PROT 7.0 6.3*  --   --   --   --   ALBUMIN 3.0* 2.6*   < > 2.9*  3.0* 3.0*   < > = values in this interval not displayed.   No results for input(s): "LIPASE", "AMYLASE" in the last 168 hours. No results for input(s): "AMMONIA" in the last 168 hours. CBC: Recent Labs  Lab 06/15/22 0814 06/16/22 0340 06/17/22 0548 06/18/22 0527 06/19/22 0445 06/20/22 0500 06/21/22 0431  WBC 11.1*   < > 11.7* 12.0* 12.2* 13.7* 12.1*  NEUTROABS 7.5  --   --   --   --   --   --  HGB 7.0*   < > 7.7* 7.9* 8.3* 8.4* 8.1*  HCT 23.6*   < > 26.3* 27.0* 28.1* 28.5* 27.9*  MCV 73.3*   < > 73.7* 73.4* 74.7* 75.8* 75.8*  PLT 271   < > 295 296 292 300 287   < > = values in this interval not displayed.   Cardiac Enzymes: No results for input(s): "CKTOTAL", "CKMB", "CKMBINDEX", "TROPONINI" in the last 168 hours. CBG: No results for input(s): "GLUCAP" in the last 168 hours.  Iron Studies: No results for input(s): "IRON", "TIBC", "TRANSFERRIN", "FERRITIN" in the last 72 hours. Studies/Results: No results found.  ferric citrate  420 mg Oral TID WC   ferrous sulfate  325 mg Oral BID WC   labetalol  400 mg Oral BID   pantoprazole  40 mg Oral Daily    BMET    Component Value Date/Time   NA 140 06/21/2022 0431   K 3.8 06/21/2022 0431   CL 99 06/21/2022 0431   CO2 27 06/21/2022 0431   GLUCOSE 120 (H) 06/21/2022 0431   GLUCOSE 88 05/15/2014 0758   BUN 77 (H) 06/21/2022 0431   CREATININE 4.48 (H) 06/21/2022 0431   CREATININE 0.58 03/06/2014 0743   CALCIUM 7.5 (L) 06/21/2022 0431   GFRNONAA 12 (L) 06/21/2022 0431   GFRAA >60 12/13/2015 1155   CBC    Component Value Date/Time   WBC 12.1 (H) 06/21/2022 0431   RBC 3.68 (L) 06/21/2022 0431   HGB 8.1 (L) 06/21/2022 0431   HGB 11.5 05/31/2015 0826   HCT 27.9 (L) 06/21/2022 0431   HCT 36.5 05/31/2015 0826   PLT 287 06/21/2022 0431   PLT 221 05/31/2015 0826   MCV 75.8 (L) 06/21/2022 0431   MCV 79 05/31/2015 0826   MCH 22.0 (L) 06/21/2022 0431   MCHC 29.0 (L) 06/21/2022 0431   RDW 21.2 (H) 06/21/2022 0431   RDW  15.7 (H) 05/31/2015 0826   LYMPHSABS 2.1 06/15/2022 0814   MONOABS 1.2 (H) 06/15/2022 0814   EOSABS 0.2 06/15/2022 0814   BASOSABS 0.1 06/15/2022 0814    Assessment/Plan: 36 year old WF with no past medical history and no medications-  presents with a crt of 4-  proteinuria and hematuria-  possible viral syndrome prodrome-  also anemia and hypoalbuminemia  Renal- dont have any recent kidney lab values.  So dont know if this change in renal function is acute or been happening for the last 6 years. NL function in 2017; she has proteinuria, hematuria  and hypoalbuminemia so suspicious for a GN.  Quantifying protein-  (2.7 grams)  and checking serologies (ANCA neg; GBM neg, dsDNA neg; ANA and ASO positive low C3, normal C4); ANA titer in process. Renal ultrasound looks normal w/ no e/o obstruction.   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  No empiric treatment indicated for post-infectious GN, but need to rule out active infection.  Urine Cx from 06/15/22 with 10,000 CFU group B strep.  Will check blood cultures and repeat urine culture.  No absolute indications for dialysis at this time, however I did discuss with her and her family that she may need to start dialysis if her renal function continues to worsen.  Given her N/V and lack of edema, will start IVF's and follow UOP and renal function. Hypertension/volume  - was overloaded-  was given lasix but with moderate response, but better response with  incr  dose + metolazone. Great UOP initially but fell off over the past 24hrs and worsening of renal function as well. Now appears dry and diuretics held and starting IVF's.  Will need to have better bp control to prevent bleeding post biopsy.  Will resume labetalol 400 mg bid and follow bp response.  Goal SBP <160. Anemia  - is severe and makes me think her CKD is not new-  has been transfused with not a  great bump in hgb -  Ferrlecit 3/4 250mg  given; ESA.  Her platelets are OK; haptoglobin NL. Hgb stable after biopsy.  Continue to follow.  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 Potassium-  improved Hypoalbuminemia-  could be due to nephrotic syndrome or just in the setting of recent viral illness and not eating.  Quantifying proteinuria will help determine which -  proteinuria not extreme at 2.7 grams-  maybe a combo of both but ordered 24 hour urine collection for protein. Proteinuria - 24 hour protein of only 317 mg/dL  Donetta Potts, MD [facility]

## 2022-06-21 NOTE — Progress Notes (Signed)
Pt had one episode of emesis this AM. Given zofran and was effective.No c/o pain or discomfort. Pt's mother is bedside.

## 2022-06-21 NOTE — Progress Notes (Signed)
PROGRESS NOTE    [name]  OIN:[id number] DOB: 1986-03-03 DOA: 06/14/2022 PCP: Patient, No Pcp Per    Brief Narrative:  36 year old female, admitted to the hospital with dyspnea on exertion.  Found to have elevated creatinine of 4.0.  She is noted to have proteinuria.  She was also anemic with a hemoglobin of 6.7.  She was transfused PRBC.  Nephrology following.  She is undergoing further work-up for renal failure as well as attempted diuresis.  Plans are for renal biopsy on 11/13.   Assessment & Plan:   Principal Problem:   Symptomatic anemia Active Problems:   AKI (acute kidney injury) (HCC)   Hyperkalemia   HTN (hypertension)   UTI (urinary tract infection)   Peripheral edema   Symptomatic iron deficiency anemia -Possibly related to blood loss from patient menses -Hemoglobin was down to 6.7 on admission -She has received 1 unit of PRBC, second unit was discontinued due to development of itching -She also received iron infusion as well as Aranesp -Follow-up hemoglobin has been stable; currently 8.3 -Haptoglobin is not significantly low -no overt bleeding appreciated.  Possible AKI -No prior labs for comparison -Creatinine currently elevated at 3.99 -Nephrology following, appreciate input -Renal ultrasound shows normal-sized kidneys -Further work-up including ANCA, complement levels, anti-double-stranded DNA antibody currently in process -Sed rate 68 -Anti-GBM antibody negative -ANA positive, will check reflex panel -ASO titers positive -Concerns for presume Lupus -Urine protein is elevated -S/P renal biopsy on 11/13; results pending. -will follow nephrology rec's and decision for treatment.  Hyperkalemia -Trending down with Lasix -Continue to follow electrolytes trend.  Hyperphosphatemia -Started on ferric citrate  Hypoalbuminemia -Although she does have proteinuria, does not appear to be nephrotic range  Volume overload -Continue high-dose IV Lasix  as part of treatment for anasarca. -Continue to monitor urine output -Echocardiogram shows normal ejection fraction -Follow further recommendations by nephrology service.  Hypertension -Continue to trend blood pressures with diuresis -She is not on any chronic meds prior to admission -Heart healthy/low-sodium discussed with patient.  Obesity, class III -Body mass index is 47.14 kg/m.  -Continue to monitor weight with diuresis -Low calorie diet, portion control and increase physical activity discussed with patient.  Type 2 diabetes -A1C 6.7 -life style changes discussed with patient -will recommend initiation of hypoglycemic regimen at discharge.   DVT prophylaxis: SCDs Start: 06/15/22 0059  Code Status: full code Family Communication: No family at bedside. Disposition Plan: Status is: Inpatient  Remains inpatient appropriate because: Further management of volume overload and work-up of renal failure   Consultants:  Nephrology  Procedures:  Planned kidney biopsy 06/19/22  Antimicrobials:  None    Subjective: No fever, no CP, no SOB, no nausea or vomiting currently. Reports good urine output.  Objective: Vitals:   06/20/22 2300 06/21/22 0421 06/21/22 0500 06/21/22 1230  BP: 132/75 (!) 160/91  131/75  Pulse: 78 70  78  Resp:  18  19  Temp:  98 F (36.7 C)  97.7 F (36.5 C)  TempSrc:    Oral  SpO2:  95%  95%  Weight:   (!) 144.8 kg   Height:        Intake/Output Summary (Last 24 hours) at 06/21/2022 1922 Last data filed at 06/21/2022 1830 Gross per 24 hour  Intake 1280.1 ml  Output 1675 ml  Net -394.9 ml   Filed Weights   06/19/22 0536 06/20/22 0527 06/21/22 0500  Weight: (!) 148.3 kg (!) 156.1 kg (!) 144.8 kg  Examination: General exam: Alert, awake, oriented x 3; no overnight events, reports no nausea, no vomiting. Good urine output  Respiratory system: Clear to auscultation. Respiratory effort normal. Good saturation. Cardiovascular  system:RRR. No rubs or gallops. Gastrointestinal system: Abdomen is obese, nondistended, soft and nontender. No organomegaly or masses felt. Normal bowel sounds heard. Central nervous system: Alert and oriented. No focal neurological deficits. Extremities: No Cyanosis, no clubbing; 2-3+ edema. Skin: No petechiae. Psychiatry: Judgement and insight appear normal. Mood & affect appropriate.   Data Reviewed: I have personally reviewed following labs and imaging studies  CBC: Recent Labs  Lab 06/15/22 0814 06/16/22 0340 06/17/22 0548 06/18/22 0527 06/19/22 0445 06/20/22 0500 06/21/22 0431  WBC 11.1*   < > 11.7* 12.0* 12.2* 13.7* 12.1*  NEUTROABS 7.5  --   --   --   --   --   --   HGB 7.0*   < > 7.7* 7.9* 8.3* 8.4* 8.1*  HCT 23.6*   < > 26.3* 27.0* 28.1* 28.5* 27.9*  MCV 73.3*   < > 73.7* 73.4* 74.7* 75.8* 75.8*  PLT 271   < > 295 296 292 300 287   < > = values in this interval not displayed.   Basic Metabolic Panel: Recent Labs  Lab 06/15/22 0832 06/15/22 1154 06/17/22 0548 06/18/22 0528 06/19/22 0445 06/20/22 0500 06/21/22 0431  NA 138   < > 141 140 139 142 140  K 5.5*   < > 4.7 3.8 3.9 3.9 3.8  CL 110   < > 108 104 102 101 99  CO2 18*   < > 21* 24 25 27 27   GLUCOSE 132*   < > 106* 127* 119* 125* 120*  BUN 88*   < > 79* 76* 74* 78* 77*  CREATININE 4.07*   < > 3.90* 3.77* 3.99* 4.19* 4.48*  CALCIUM 7.1*   < > 7.3* 7.3* 7.1* 7.5* 7.5*  MG 2.6*  --   --   --   --   --   --   PHOS  --    < > 9.4* 10.0* 9.1* 9.9* 10.2*   < > = values in this interval not displayed.   GFR: Estimated Creatinine Clearance: 26.7 mL/min (A) (by C-G formula based on SCr of 4.48 mg/dL (H)).  Liver Function Tests: Recent Labs  Lab 06/14/22 2119 06/15/22 0832 06/16/22 0340 06/17/22 0548 06/18/22 0528 06/19/22 0445 06/20/22 0500 06/21/22 0431  AST 16 12*  --   --   --   --   --   --   ALT 12 11  --   --   --   --   --   --   ALKPHOS 96 80  --   --   --   --   --   --   BILITOT 0.4 0.4   --   --   --   --   --   --   PROT 7.0 6.3*  --   --   --   --   --   --   ALBUMIN 3.0* 2.6*   < > 2.9* 3.0* 2.9* 3.0* 3.0*   < > = values in this interval not displayed.   Coagulation Profile: Recent Labs  Lab 06/18/22 0527  INR 1.2   Sepsis Labs:  Recent Results (from the past 240 hour(s))  Urine Culture     Status: Abnormal   Collection Time: 06/15/22  1:20 AM   Specimen: Urine, Clean  Catch  Result Value Ref Range Status   Specimen Description   Final    URINE, CLEAN CATCH Performed at [facility], [address] [place], [place] [address]    Special Requests   Final    NONE Performed at [facility], [address] [place], [place] [address]    Culture (A)  Final    10,000 COLONIES/mL GROUP B STREP(S.AGALACTIAE)ISOLATED TESTING AGAINST S. AGALACTIAE NOT ROUTINELY PERFORMED DUE TO PREDICTABILITY OF AMP/PEN/VAN SUSCEPTIBILITY. Performed at [facility], [place] [address], [place], [place] [address]    Report Status 06/16/2022 FINAL  Final  Culture, blood (Routine X 2) w Reflex to ID Panel     Status: None (Preliminary result)   Collection Time: 06/21/22 10:47 AM   Specimen: BLOOD  Result Value Ref Range Status   Specimen Description   Final    BLOOD BLOOD LEFT ARM ac BOTTLES DRAWN AEROBIC AND ANAEROBIC Blood Culture adequate volume BLOOD LEFT ARM   Special Requests   Final    NONE Performed at [facility], [address] [place], [place] [address]    Culture PENDING  Incomplete   Report Status PENDING  Incomplete  Culture, blood (Routine X 2) w Reflex to ID Panel     Status: None (Preliminary result)   Collection Time: 06/21/22 10:55 AM   Specimen: BLOOD  Result Value Ref Range Status   Specimen Description   Final    BLOOD BLOOD LEFT HAND BOTTLES DRAWN AEROBIC AND ANAEROBIC Blood Culture adequate volume   Special Requests   Final    NONE Performed at [facility], [address], [place], [place] [address]    Culture PENDING  Incomplete   Report Status  PENDING  Incomplete     Radiology Studies: No results found.  Scheduled Meds:  ferric citrate  420 mg Oral TID WC   ferrous sulfate  325 mg Oral BID WC   labetalol  400 mg Oral BID   pantoprazole  40 mg Oral Daily   Continuous Infusions:  sodium chloride 75 mL/hr at 06/21/22 1142   furosemide 160 mg (06/21/22 1648)     LOS: 7 days    Roxan Hockey, MD [facility]   If 7PM-7AM, please contact night-coverage [email or web address]  06/21/2022, 7:22 PM

## 2022-06-21 NOTE — Progress Notes (Signed)
Patient has been stable, vitals stable. Patient has had no complaints this shift.  Mother has been at bedside.

## 2022-06-22 LAB — RENAL FUNCTION PANEL
Albumin: 2.9 g/dL — ABNORMAL LOW (ref 3.5–5.0)
Anion gap: 12 (ref 5–15)
BUN: 87 mg/dL — ABNORMAL HIGH (ref 6–20)
CO2: 26 mmol/L (ref 22–32)
Calcium: 7.6 mg/dL — ABNORMAL LOW (ref 8.9–10.3)
Chloride: 102 mmol/L (ref 98–111)
Creatinine, Ser: 4.19 mg/dL — ABNORMAL HIGH (ref 0.44–1.00)
GFR, Estimated: 13 mL/min — ABNORMAL LOW (ref >60)
Glucose, Bld: 114 mg/dL — ABNORMAL HIGH (ref 70–99)
Phosphorus: 9.7 mg/dL — ABNORMAL HIGH (ref 2.5–4.6)
Potassium: 3.8 mmol/L (ref 3.5–5.1)
Sodium: 140 mmol/L (ref 135–145)

## 2022-06-22 LAB — URINE CULTURE: Culture: NO GROWTH

## 2022-06-22 LAB — CBC
HCT: 26.5 % — ABNORMAL LOW (ref 36.0–46.0)
Hemoglobin: 7.7 g/dL — ABNORMAL LOW (ref 12.0–15.0)
MCH: 22.4 pg — ABNORMAL LOW (ref 26.0–34.0)
MCHC: 29.1 g/dL — ABNORMAL LOW (ref 30.0–36.0)
MCV: 77 fL — ABNORMAL LOW (ref 80.0–100.0)
Platelets: 272 10*3/uL (ref 150–400)
RBC: 3.44 MIL/uL — ABNORMAL LOW (ref 3.87–5.11)
RDW: 21.5 % — ABNORMAL HIGH (ref 11.5–15.5)
WBC: 10 10*3/uL (ref 4.0–10.5)
nRBC: 0 % (ref 0.0–0.2)

## 2022-06-22 MED ORDER — LABETALOL HCL 200 MG PO TABS
400.0000 mg | ORAL_TABLET | Freq: Three times a day (TID) | ORAL | Status: DC
  Administered 2022-06-22 – 2022-06-24 (×6): 400 mg via ORAL
  Filled 2022-06-22 (×6): qty 2

## 2022-06-22 NOTE — Progress Notes (Signed)
Patient ID: [name], female   DOB: 21-Mar-1986, 36 y.o.   MRN: [id number] S: Feels better this morning but admits having some gross hematuria and some RLQ discomfort. O:BP (!) 154/80   Pulse 78   Temp 97.9 F (36.6 C)   Resp 18   Ht 5\' 9"  (1.753 m)   Wt (!) 145.7 kg   LMP 06/14/2022   SpO2 96%   BMI 47.43 kg/m   Intake/Output Summary (Last 24 hours) at 06/22/2022 0923 Last data filed at 06/22/2022 0500 Gross per 24 hour  Intake 792.1 ml  Output 1525 ml  Net -732.9 ml   Intake/Output: I/O last 3 completed shifts: In: 1280.1 [P.O.:820; I.V.:246.1; IV Piggyback:214] Out: 2725 [Urine:2725]  Intake/Output this shift:  No intake/output data recorded. Weight change: 0.9 kg Gen: NAD CVS: RRR Resp:CTA Abd: +BS, soft, NT/ND Ext: no edema  Recent Labs  Lab 06/16/22 0340 06/17/22 0548 06/18/22 0528 06/19/22 0445 06/20/22 0500 06/21/22 0431 06/22/22 0353  NA 139 141 140 139 142 140 140  K 5.3* 4.7 3.8 3.9 3.9 3.8 3.8  CL 111 108 104 102 101 99 102  CO2 18* 21* 24 25 27 27 26   GLUCOSE 166* 106* 127* 119* 125* 120* 114*  BUN 84* 79* 76* 74* 78* 77* 87*  CREATININE 4.15* 3.90* 3.77* 3.99* 4.19* 4.48* 4.19*  ALBUMIN 2.7* 2.9* 3.0* 2.9* 3.0* 3.0* 2.9*  CALCIUM 7.1* 7.3* 7.3* 7.1* 7.5* 7.5* 7.6*  PHOS 9.1* 9.4* 10.0* 9.1* 9.9* 10.2* 9.7*   Liver Function Tests: Recent Labs  Lab 06/20/22 0500 06/21/22 0431 06/22/22 0353  ALBUMIN 3.0* 3.0* 2.9*   No results for input(s): "LIPASE", "AMYLASE" in the last 168 hours. No results for input(s): "AMMONIA" in the last 168 hours. CBC: Recent Labs  Lab 06/18/22 0527 06/19/22 0445 06/20/22 0500 06/21/22 0431 06/22/22 0353  WBC 12.0* 12.2* 13.7* 12.1* 10.0  HGB 7.9* 8.3* 8.4* 8.1* 7.7*  HCT 27.0* 28.1* 28.5* 27.9* 26.5*  MCV 73.4* 74.7* 75.8* 75.8* 77.0*  PLT 296 292 300 287 272   Cardiac Enzymes: No results for input(s): "CKTOTAL", "CKMB", "CKMBINDEX", "TROPONINI" in the last 168 hours. CBG: No results for  input(s): "GLUCAP" in the last 168 hours.  Iron Studies: No results for input(s): "IRON", "TIBC", "TRANSFERRIN", "FERRITIN" in the last 72 hours. Studies/Results: No results found.  ferric citrate  420 mg Oral TID WC   ferrous sulfate  325 mg Oral BID WC   labetalol  400 mg Oral TID   pantoprazole  40 mg Oral Daily    BMET    Component Value Date/Time   NA 140 06/22/2022 0353   K 3.8 06/22/2022 0353   CL 102 06/22/2022 0353   CO2 26 06/22/2022 0353   GLUCOSE 114 (H) 06/22/2022 0353   GLUCOSE 88 05/15/2014 0758   BUN 87 (H) 06/22/2022 0353   CREATININE 4.19 (H) 06/22/2022 0353   CREATININE 0.58 03/06/2014 0743   CALCIUM 7.6 (L) 06/22/2022 0353   GFRNONAA 13 (L) 06/22/2022 0353   GFRAA >60 12/13/2015 1155   CBC    Component Value Date/Time   WBC 10.0 06/22/2022 0353   RBC 3.44 (L) 06/22/2022 0353   HGB 7.7 (L) 06/22/2022 0353   HGB 11.5 05/31/2015 0826   HCT 26.5 (L) 06/22/2022 0353   HCT 36.5 05/31/2015 0826   PLT 272 06/22/2022 0353   PLT 221 05/31/2015 0826   MCV 77.0 (L) 06/22/2022 0353   MCV 79 05/31/2015 0826   MCH 22.4 (L)  06/22/2022 0353   MCHC 29.1 (L) 06/22/2022 0353   RDW 21.5 (H) 06/22/2022 0353   RDW 15.7 (H) 05/31/2015 0826   LYMPHSABS 2.1 06/15/2022 0814   MONOABS 1.2 (H) 06/15/2022 0814   EOSABS 0.2 06/15/2022 0814   BASOSABS 0.1 06/15/2022 0814    Assessment/Plan: 36 year old WF with no past medical history and no medications-  presents with a crt of 4-  proteinuria and hematuria-  possible viral syndrome prodrome-  also anemia and hypoalbuminemia  Renal- dont have any recent kidney lab values.  So dont know if this change in renal function is acute or been happening for the last 6 years. NL function in 2017; she has proteinuria, hematuria  and hypoalbuminemia so suspicious for a GN.  Quantifying protein-  (2.7 grams)  and checking serologies (ANCA neg; GBM neg, dsDNA neg; ANA and ASO positive low C3, normal C4); ANA titer in process. Renal ultrasound  looks normal w/ no e/o obstruction.   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  No empiric treatment indicated for post-infectious GN, but need to rule out active infection.  Urine Cx from 06/15/22 with 10,000 CFU group B strep.  Repeated blood cultures and urine cultures on 06/21/22.  No absolute indications for dialysis at this time, however I did discuss with her and her family that she may need to start dialysis if her renal function continues to worsen.  Given her N/V and lack of edema, she was started IVF's and discontinued IV lasix.  Scr improving and will continue to follow UOP and renal function. Hypertension/volume  - was overloaded-  was given lasix but with moderate response, but better response with  incr dose + metolazone. Now appears dry and diuretics held and started IVF's.  Will need to have better bp control to prevent bleeding post biopsy.  Will increase labetalol 400 mg from bid to tid and follow bp response.  Goal SBP <140. Anemia  - is severe and makes me think her CKD is not new-  has been transfused with not a great bump in hgb -  Ferrlecit 3/4 250mg  given; ESA.  Her platelets are OK; haptoglobin NL. Hgb stable after biopsy.  Continue to follow and transfuse prn. 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 Potassium-  improved Hypoalbuminemia-  could be due to nephrotic syndrome or just in the setting of recent viral illness and not eating.  Quantifying proteinuria will help determine which -  proteinuria not extreme at 2.7 grams-  maybe a combo of both but ordered 24 hour urine collection for protein. Proteinuria - 24 hour protein of only 317 mg/dL  Donetta Potts, MD [facility]

## 2022-06-22 NOTE — Progress Notes (Signed)
PROGRESS NOTE    [name]  JJK:[id number] DOB: 1986-06-12 DOA: 06/14/2022 PCP: Patient, No Pcp Per    Brief Narrative:  36 year old female, admitted to the hospital with dyspnea on exertion.  Found to have elevated creatinine of 4.0.  She is noted to have proteinuria.  She was also anemic with a hemoglobin of 6.7.  She was transfused PRBC.  Nephrology following.  She is undergoing further work-up for renal failure as well as attempted diuresis. 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     Assessment & Plan:   Principal Problem:   Symptomatic anemia Active Problems:   AKI (acute kidney injury) (HCC)   Hyperkalemia   HTN (hypertension)   UTI (urinary tract infection)   Peripheral edema   Symptomatic iron deficiency anemia -Possibly related to blood loss from patient menses -Hemoglobin was down to 6.7 on admission -She has received 1 unit of PRBC, second unit was discontinued due to development of itching -She also received iron infusion as well as Aranesp -Follow-up hemoglobin has been stable;  -Haptoglobin is not significantly low -no overt bleeding appreciated.  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   -- ANA and ASO positive low C3, normal C4,; ANA titer in process.  Renal ultrasound looks normal w/ no e/o obstruction.     06/22/22 -Urine output continues to improve creatinine appears to be trending back down again after initially  rising  Hyperkalemia -Resolved with treatment  Hyperphosphatemia -Started on ferric citrate  Hypoalbuminemia -Although she does have proteinuria, does not appear to be nephrotic range  Volume overload/edema most likely related to underlying renal disorder -Initially treated with high-dose IV Lasix as part of treatment for anasarca. -Echocardiogram shows normal ejection fraction -Nephrology recommends gentle hydration with IV fluids  Hypertension -Continue to trend blood pressures with diuresis -She is not on any chronic meds prior to admission -Heart healthy/low-sodium discussed with patient.  Obesity, class III -Body mass index is 47.43 kg/m.  -Continue to monitor weight with diuresis -Low calorie diet, portion control and increase physical activity discussed with patient.  Type 2 diabetes -A1C 6.7 Use Novolog/Humalog Sliding scale insulin with Accu-Cheks/Fingersticks as ordered    DVT prophylaxis: SCDs Start: 06/15/22 0059  Code Status: full code Family Communication: father at bedside Disposition Plan: Status is: Inpatient  Remains inpatient appropriate because: Further management of volume overload and work-up of renal failure   Consultants:  Nephrology  Procedures:  Planned kidney biopsy 06/19/22  Antimicrobials:  None    Subjective: --No new concerns - No Nausea, Vomiting or Diarrhea No fever  Or chills  -Continues to void well  Objective: Vitals:   06/21/22 2000 06/22/22 0300 06/22/22 0603 06/22/22 1302  BP: (!) 145/73  (!) 154/80 (!) 166/96  Pulse: 82  78 73  Resp: 18  18   Temp: 98 F (36.7 C)  97.9 F (36.6 C) 97.7 F (36.5 C)  TempSrc:    Oral  SpO2: 100%  96% 97%  Weight:  (!) 145.7 kg    Height:  5\' 9"  (1.753 m)  Intake/Output Summary (Last 24 hours) at 06/22/2022 1854 Last data filed at 06/22/2022 1713 Gross per 24 hour  Intake 2194.47 ml  Output 1850 ml  Net 344.47 ml   Filed Weights   06/20/22 0527 06/21/22 0500  06/22/22 0300  Weight: (!) 156.1 kg (!) 144.8 kg (!) 145.7 kg    Physical Exam  Gen:- Awake Alert, in no acute distress  HEENT:- [place].AT, No sclera icterus Neck-Supple Neck,No JVD,.  Lungs-  CTAB , fair air movement bilaterally  CV- S1, S2 normal, RRR Abd-  +ve B.Sounds, Abd Soft, No tenderness, increased truncal adiposity Extremity/Skin:- +ve  edema,   good pedal pulses  Psych-affect is appropriate, oriented x3 Neuro-no new focal deficits, no tremors   Data Reviewed: I have personally reviewed following labs and imaging studies  CBC: Recent Labs  Lab 06/18/22 0527 06/19/22 0445 06/20/22 0500 06/21/22 0431 06/22/22 0353  WBC 12.0* 12.2* 13.7* 12.1* 10.0  HGB 7.9* 8.3* 8.4* 8.1* 7.7*  HCT 27.0* 28.1* 28.5* 27.9* 26.5*  MCV 73.4* 74.7* 75.8* 75.8* 77.0*  PLT 296 292 300 287 627   Basic Metabolic Panel: Recent Labs  Lab 06/18/22 0528 06/19/22 0445 06/20/22 0500 06/21/22 0431 06/22/22 0353  NA 140 139 142 140 140  K 3.8 3.9 3.9 3.8 3.8  CL 104 102 101 99 102  CO2 24 25 27 27 26   GLUCOSE 127* 119* 125* 120* 114*  BUN 76* 74* 78* 77* 87*  CREATININE 3.77* 3.99* 4.19* 4.48* 4.19*  CALCIUM 7.3* 7.1* 7.5* 7.5* 7.6*  PHOS 10.0* 9.1* 9.9* 10.2* 9.7*   GFR: Estimated Creatinine Clearance: 28.7 mL/min (A) (by C-G formula based on SCr of 4.19 mg/dL (H)).  Liver Function Tests: Recent Labs  Lab 06/18/22 0528 06/19/22 0445 06/20/22 0500 06/21/22 0431 06/22/22 0353  ALBUMIN 3.0* 2.9* 3.0* 3.0* 2.9*   Coagulation Profile: Recent Labs  Lab 06/18/22 0527  INR 1.2   Sepsis Labs:  Recent Results (from the past 240 hour(s))  Urine Culture     Status: Abnormal   Collection Time: 06/15/22  1:20 AM   Specimen: Urine, Clean Catch  Result Value Ref Range Status   Specimen Description   Final    URINE, CLEAN CATCH Performed at [facility], [address], [place], [place] [address]    Special Requests   Final    NONE Performed at [facility], [address]  [place], [place] [address]    Culture (A)  Final    10,000 COLONIES/mL GROUP B STREP(S.AGALACTIAE)ISOLATED TESTING AGAINST S. AGALACTIAE NOT ROUTINELY PERFORMED DUE TO PREDICTABILITY OF AMP/PEN/VAN SUSCEPTIBILITY. Performed at [facility], [place] [address] [place], [place] [address]    Report Status 06/16/2022 FINAL  Final  Culture, blood (Routine X 2) w Reflex to ID Panel     Status: None (Preliminary result)   Collection Time: 06/21/22 10:47 AM   Specimen: BLOOD  Result Value Ref Range Status   Specimen Description   Final    BLOOD BLOOD LEFT ARM ac BOTTLES DRAWN AEROBIC AND ANAEROBIC Blood Culture adequate volume BLOOD LEFT ARM   Special Requests NONE  Final   Culture  Setup Time   Final    GRAM POSITIVE COCCI ANAEROBIC BOTTLE ONLY Gram Stain Report Called to,Read Back By and Verified With: Ninfa Meeker RN [phone] K FORSYTH Performed at [facility], [address] [place], [place] [address]    Culture PENDING  Incomplete   Report Status PENDING  Incomplete  Urine Culture  Status: None   Collection Time: 06/21/22 10:48 AM   Specimen: Urine, Clean Catch  Result Value Ref Range Status   Specimen Description   Final    URINE, CLEAN CATCH Performed at [facility], [address], [place], [place] [address]    Special Requests   Final    NONE Performed at [facility], [address], [place], [place] [address]    Culture   Final    NO GROWTH Performed at [facility], [place] [address] [place], [place] [address]    Report Status 06/22/2022 FINAL  Final  Culture, blood (Routine X 2) w Reflex to ID Panel     Status: None (Preliminary result)   Collection Time: 06/21/22 10:55 AM   Specimen: BLOOD  Result Value Ref Range Status   Specimen Description   Final    BLOOD BLOOD LEFT HAND BOTTLES DRAWN AEROBIC AND ANAEROBIC Blood Culture adequate volume   Special Requests   Final    NONE Performed at [facility], [address], [place], [place] [address]    Culture PENDING   Incomplete   Report Status PENDING  Incomplete     Radiology Studies: No results found.  Scheduled Meds:  ferric citrate  420 mg Oral TID WC   ferrous sulfate  325 mg Oral BID WC   labetalol  400 mg Oral TID   pantoprazole  40 mg Oral Daily   Continuous Infusions:  sodium chloride 50 mL/hr at 06/22/22 1713    LOS: 8 days   Roxan Hockey, MD [facility]   If 7PM-7AM, please contact night-coverage [email or web address]  06/22/2022, 6:54 PM

## 2022-06-22 NOTE — Progress Notes (Signed)
Positive blood culture for anaerobic cocci MD made aware of lab results.

## 2022-06-22 NOTE — Progress Notes (Signed)
[facility] visited pt. briefly this morning to assess needs; pt.'s case was discussed in IDT rounds this AM and it seemed that she may be in the midst of some significant changes in health.  When [facility] visited, pt. was sitting up in bed awake.  She shared that this admission has indeed been "very stressful" but that she now feels "100% better" and hopes to be discharged tomorrow.  She shared that she had developed a cough following a trip to [place] last week, and soon after developed swelling all over her body.  Medical team were initially concerned re: pt.'s kidney function but pt. says her kidneys are recovering.  No immediate needs shared at this time but pt. is aware of chaplains' availability.  Lindaann Pascal, Chaplain

## 2022-06-23 ENCOUNTER — Telehealth: Payer: Self-pay

## 2022-06-23 LAB — BLOOD CULTURE ID PANEL (REFLEXED) - BCID2

## 2022-06-23 LAB — RENAL FUNCTION PANEL
Albumin: 2.9 g/dL — ABNORMAL LOW (ref 3.5–5.0)
Anion gap: 11 (ref 5–15)
BUN: 83 mg/dL — ABNORMAL HIGH (ref 6–20)
CO2: 25 mmol/L (ref 22–32)
Calcium: 8 mg/dL — ABNORMAL LOW (ref 8.9–10.3)
Chloride: 105 mmol/L (ref 98–111)
Creatinine, Ser: 3.9 mg/dL — ABNORMAL HIGH (ref 0.44–1.00)
GFR, Estimated: 15 mL/min — ABNORMAL LOW (ref >60)
Glucose, Bld: 115 mg/dL — ABNORMAL HIGH (ref 70–99)
Phosphorus: 7.8 mg/dL — ABNORMAL HIGH (ref 2.5–4.6)
Potassium: 4.2 mmol/L (ref 3.5–5.1)
Sodium: 141 mmol/L (ref 135–145)

## 2022-06-23 LAB — CBC
HCT: 26.4 % — ABNORMAL LOW (ref 36.0–46.0)
Hemoglobin: 7.6 g/dL — ABNORMAL LOW (ref 12.0–15.0)
MCH: 22.4 pg — ABNORMAL LOW (ref 26.0–34.0)
MCHC: 28.8 g/dL — ABNORMAL LOW (ref 30.0–36.0)
MCV: 77.9 fL — ABNORMAL LOW (ref 80.0–100.0)
Platelets: 250 10*3/uL (ref 150–400)
RBC: 3.39 MIL/uL — ABNORMAL LOW (ref 3.87–5.11)
RDW: 22.3 % — ABNORMAL HIGH (ref 11.5–15.5)
WBC: 9.3 10*3/uL (ref 4.0–10.5)
nRBC: 0 % (ref 0.0–0.2)

## 2022-06-23 LAB — GLUCOSE, CAPILLARY
Glucose-Capillary: 134 mg/dL — ABNORMAL HIGH (ref 70–99)
Glucose-Capillary: 116 mg/dL — ABNORMAL HIGH (ref 70–99)
Glucose-Capillary: 120 mg/dL — ABNORMAL HIGH (ref 70–99)
Glucose-Capillary: 124 mg/dL — ABNORMAL HIGH (ref 70–99)

## 2022-06-23 MED ORDER — SODIUM CHLORIDE 0.9 % IV SOLN
250.0000 mg | Freq: Every day | INTRAVENOUS | Status: AC
  Administered 2022-06-23 – 2022-06-24 (×2): 250 mg via INTRAVENOUS
  Filled 2022-06-23: qty 20
  Filled 2022-06-23: qty 250

## 2022-06-23 MED ORDER — DARBEPOETIN ALFA 100 MCG/0.5ML IJ SOSY
100.0000 ug | PREFILLED_SYRINGE | Freq: Once | INTRAMUSCULAR | Status: AC
  Administered 2022-06-23: 100 ug via SUBCUTANEOUS
  Filled 2022-06-23: qty 0.5

## 2022-06-23 NOTE — Progress Notes (Signed)
Patient ID: [name], female   DOB: 10-25-1985, 36 y.o.   MRN: [id number] S: Feels well, no complaints. O:BP (!) 141/82 (BP Location: Right Arm)   Pulse 77   Temp 98.4 F (36.9 C) (Oral)   Resp 16   Ht 5\' 9"  (1.753 m)   Wt (!) 146.5 kg   LMP 06/14/2022   SpO2 97%   BMI 47.69 kg/m   Intake/Output Summary (Last 24 hours) at 06/23/2022 0905 Last data filed at 06/23/2022 0700 Gross per 24 hour  Intake 2843.1 ml  Output 1600 ml  Net 1243.1 ml   Intake/Output: I/O last 3 completed shifts: In: 3083.1 [P.O.:960; I.V.:2123.1] Out: 2400 [Urine:2400]  Intake/Output this shift:  No intake/output data recorded. Weight change: 0.8 kg Gen: NAD CVS: RRR Resp:CTA Abd: +BS, soft, NT/ND Ext: no edema  Recent Labs  Lab 06/17/22 0548 06/18/22 0528 06/19/22 0445 06/20/22 0500 06/21/22 0431 06/22/22 0353 06/23/22 0408  NA 141 140 139 142 140 140 141  K 4.7 3.8 3.9 3.9 3.8 3.8 4.2  CL 108 104 102 101 99 102 105  CO2 21* 24 25 27 27 26 25   GLUCOSE 106* 127* 119* 125* 120* 114* 115*  BUN 79* 76* 74* 78* 77* 87* 83*  CREATININE 3.90* 3.77* 3.99* 4.19* 4.48* 4.19* 3.90*  ALBUMIN 2.9* 3.0* 2.9* 3.0* 3.0* 2.9* 2.9*  CALCIUM 7.3* 7.3* 7.1* 7.5* 7.5* 7.6* 8.0*  PHOS 9.4* 10.0* 9.1* 9.9* 10.2* 9.7* 7.8*   Liver Function Tests: Recent Labs  Lab 06/21/22 0431 06/22/22 0353 06/23/22 0408  ALBUMIN 3.0* 2.9* 2.9*   No results for input(s): "LIPASE", "AMYLASE" in the last 168 hours. No results for input(s): "AMMONIA" in the last 168 hours. CBC: Recent Labs  Lab 06/19/22 0445 06/20/22 0500 06/21/22 0431 06/22/22 0353 06/23/22 0408  WBC 12.2* 13.7* 12.1* 10.0 9.3  HGB 8.3* 8.4* 8.1* 7.7* 7.6*  HCT 28.1* 28.5* 27.9* 26.5* 26.4*  MCV 74.7* 75.8* 75.8* 77.0* 77.9*  PLT 292 300 287 272 250   Cardiac Enzymes: No results for input(s): "CKTOTAL", "CKMB", "CKMBINDEX", "TROPONINI" in the last 168 hours. CBG: Recent Labs  Lab 06/23/22 0817  GLUCAP 134*    Iron Studies: No  results for input(s): "IRON", "TIBC", "TRANSFERRIN", "FERRITIN" in the last 72 hours. Studies/Results: No results found.  ferric citrate  420 mg Oral TID WC   ferrous sulfate  325 mg Oral BID WC   labetalol  400 mg Oral TID   pantoprazole  40 mg Oral Daily    BMET    Component Value Date/Time   NA 141 06/23/2022 0408   K 4.2 06/23/2022 0408   CL 105 06/23/2022 0408   CO2 25 06/23/2022 0408   GLUCOSE 115 (H) 06/23/2022 0408   GLUCOSE 88 05/15/2014 0758   BUN 83 (H) 06/23/2022 0408   CREATININE 3.90 (H) 06/23/2022 0408   CREATININE 0.58 03/06/2014 0743   CALCIUM 8.0 (L) 06/23/2022 0408   GFRNONAA 15 (L) 06/23/2022 0408   GFRAA >60 12/13/2015 1155   CBC    Component Value Date/Time   WBC 9.3 06/23/2022 0408   RBC 3.39 (L) 06/23/2022 0408   HGB 7.6 (L) 06/23/2022 0408   HGB 11.5 05/31/2015 0826   HCT 26.4 (L) 06/23/2022 0408   HCT 36.5 05/31/2015 0826   PLT 250 06/23/2022 0408   PLT 221 05/31/2015 0826   MCV 77.9 (L) 06/23/2022 0408   MCV 79 05/31/2015 0826   MCH 22.4 (L) 06/23/2022 0408   MCHC 28.8 (  L) 06/23/2022 0408   RDW 22.3 (H) 06/23/2022 0408   RDW 15.7 (H) 05/31/2015 0826   LYMPHSABS 2.1 06/15/2022 0814   MONOABS 1.2 (H) 06/15/2022 0814   EOSABS 0.2 06/15/2022 0814   BASOSABS 0.1 06/15/2022 0814    Assessment/Plan: 36 year old WF with no past medical history and no medications-  presents with a crt of 4-  proteinuria and hematuria-  possible viral syndrome prodrome-  also anemia and hypoalbuminemia  Renal- dont have any recent kidney lab values.  So dont know if this change in renal function is acute or been happening for the last 6 years. NL function in 2017; she has proteinuria, hematuria  and hypoalbuminemia so suspicious for a GN.  Quantifying protein-  (2.7 grams)  and checking serologies (ANCA neg; GBM neg, dsDNA neg; ANA and ASO positive low C3, normal C4); ANA titer in process. Renal ultrasound looks normal w/ no e/o obstruction.   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  No empiric treatment indicated for post-infectious GN, but need to rule out active infection.  Urine Cx from 06/15/22 with 10,000 CFU group B strep.   Repeated blood cultures and urine cultures on 06/21/22.  Blood cultures 1/4+ in aerobic bottle Staph epi so likely a contaminant.  Urine cultures no growth to date. Renal function is slowly improving with IVF's.  Will stop them today and follow UOP and SCr.  If Scr continues to improve without IVF's, ok to discharge to home tomorrow but will need close outpatient follow up with our office and she needs to establish primary care. Continue to follow UOP and renal function. Hypertension/volume  - was overloaded-  was given lasix but with moderate response, but better response with  incr dose + metolazone. Now appears dry and diuretics held and started IVF's.  Will need to have better bp control to prevent bleeding post biopsy.  Will increase labetalol 400 mg from bid to tid and follow bp response.  Goal SBP <140. Anemia  - is severe and makes me think her CKD is not new-  has been transfused with not a great bump in hgb -  Ferrlecit 3/4 250mg  given; ESA.  Her platelets are OK; haptoglobin NL. Hgb stable after biopsy.  Continue to follow and transfuse prn. 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 Potassium-  improved Hypoalbuminemia-  could be due to nephrotic syndrome or just in the setting of recent viral illness and not eating.  Quantifying proteinuria will help determine which -  proteinuria not extreme at 2.7 grams-  maybe a combo of both but ordered 24 hour urine collection for protein. Proteinuria - 24 hour protein of only 317 mg/dL  Donetta Potts, MD [facility]  Pt will not be physically seen over the  weekend, however her chart will be reviewed remotely.  If her Scr improves overnight without IVF's, ok to discharge to home and will need close follow up with our office and establish local primary care.

## 2022-06-23 NOTE — TOC Progression Note (Signed)
Transition of Care [facility]) - Progression Note    Patient Details  Name: [name] MRN: [id number] Date of Birth: 05-13-1986  Transition of Care [facility]) CM/SW Contact  Shade Flood, LCSW Phone Number: 06/23/2022, 12:57 PM  Clinical Narrative:      TOC following. Per Nephrology note, if pt continues to improve, she may be able to dc home tomorrow with outpatient nephrology follow up.  TOC had referred pt to [facility] back on 11/9. TOC updated [facility] today of likely weekend dc with request for follow up with her to arrange follow up care.   Barriers to Discharge: Continued Medical Work up  Expected Discharge Plan and Services                                                 Social Determinants of Health (SDOH) Interventions    Readmission Risk Interventions     No data to display

## 2022-06-23 NOTE — Progress Notes (Addendum)
Date and time results received: 06/23/22  00:23  Test: Blood cultures  Critical Value: Staph epi in 1/4 bottles No gene resistance detected.   Name of Provider Notified: Josephine Cables

## 2022-06-23 NOTE — Progress Notes (Signed)
PROGRESS NOTE    [name]  UKG:[id number] DOB: [birth year] DOA: 06/14/2022 PCP: Patient, No Pcp Per    Brief Narrative:  36 year old female, admitted to the hospital with dyspnea on exertion.  Found to have elevated creatinine of 4.0.  She is noted to have proteinuria.  She was also anemic with a hemoglobin of 6.7.  She was transfused PRBC.  Nephrology following.  She is undergoing further work-up for renal failure as well as attempted diuresis. 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     Assessment & Plan:   Principal Problem:   Symptomatic anemia Active Problems:   AKI (acute kidney injury) (HCC)   Hyperkalemia   HTN (hypertension)   UTI (urinary tract infection)   Peripheral edema   Symptomatic iron deficiency anemia -Possibly related to blood loss from patient menses -Hemoglobin was down to 6.7 on admission -She has received 1 unit of PRBC, second unit was discontinued due to development of itching -Hemoglobin currently above 7 -Haptoglobin is not significantly low -no overt bleeding appreciated. 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   -- ANA and ASO positive low C3, normal C4,; ANA titer in process.  Renal ultrasound looks normal w/ no e/o obstruction.     06/23/22 -Urine output continues to improve creatinine appears to be trending back down again after initially rising -Creatinine currently down to  3.9  Hyperkalemia -Resolved with treatment  Hyperphosphatemia -Started on ferric citrate  Hypoalbuminemia -Although she does have proteinuria, does not appear to be nephrotic range  Volume overload/edema most likely related to underlying renal disorder -Initially treated with high-dose IV Lasix as part of treatment for anasarca. -Echocardiogram shows normal ejection fraction -Nephrology recommends gentle hydration with IV fluids  Hypertension -Continue to trend blood pressures with diuresis -She is not on any chronic meds prior to admission -Heart healthy/low-sodium discussed with patient.  Obesity, class III -Body mass index is 47.69 kg/m.  -Continue to monitor weight with diuresis -Low calorie diet, portion control and increase physical activity discussed with patient.  Type 2 diabetes -A1C 6.7 Use Novolog/Humalog Sliding scale insulin with Accu-Cheks/Fingersticks as ordered    DVT prophylaxis: SCDs Start: 06/15/22 0059  Code Status: full code Family Communication:  Mother at bedside Disposition Plan: Status is: Inpatient  Remains inpatient appropriate because: Further management of volume overload and work-up of renal failure  Consultants:  Nephrology  Procedures:  Planned kidney biopsy 06/19/22  Antimicrobials:  None    Subjective: --No new concerns Mother at bedside - Voiding well No Nausea, Vomiting or Diarrhea   Objective: Vitals:   06/23/22 0500 06/23/22 1020 06/23/22 1316 06/23/22 1800  BP:  138/79 (!) 156/81 (!) 178/93  Pulse:  68 74 72  Resp:   20 16  Temp:   98.1 F (36.7 C)   TempSrc:   Oral   SpO2:   98% 98%  Weight: (!) 146.5 kg     Height:        Intake/Output Summary (Last 24 hours) at 06/23/2022 1938 Last data  filed at 06/23/2022 1514 Gross per 24 hour  Intake 1802.26 ml  Output 800 ml  Net 1002.26 ml   Filed Weights   06/21/22 0500 06/22/22 0300 06/23/22 0500  Weight: (!) 144.8 kg (!) 145.7 kg (!) 146.5 kg     Physical Exam  Gen:- Awake Alert, in no acute distress  HEENT:- [place].AT, No sclera icterus Neck-Supple Neck,No JVD,.  Lungs-  CTAB , fair air movement bilaterally  CV- S1, S2 normal, RRR Abd-  +ve B.Sounds, Abd Soft, No tenderness, increased truncal adiposity,, no CVA area tenderness Extremity/Skin:-Improving edema,   good pedal pulses  Psych-affect is appropriate, oriented x3 Neuro-no new focal deficits, no tremors   Data Reviewed: I have personally reviewed following labs and imaging studies  CBC: Recent Labs  Lab 06/19/22 0445 06/20/22 0500 06/21/22 0431 06/22/22 0353 06/23/22 0408  WBC 12.2* 13.7* 12.1* 10.0 9.3  HGB 8.3* 8.4* 8.1* 7.7* 7.6*  HCT 28.1* 28.5* 27.9* 26.5* 26.4*  MCV 74.7* 75.8* 75.8* 77.0* 77.9*  PLT 292 300 287 272 409   Basic Metabolic Panel: Recent Labs  Lab 06/19/22 0445 06/20/22 0500 06/21/22 0431 06/22/22 0353 06/23/22 0408  NA 139 142 140 140 141  K 3.9 3.9 3.8 3.8 4.2  CL 102 101 99 102 105  CO2 25 27 27 26 25   GLUCOSE 119* 125* 120* 114* 115*  BUN 74* 78* 77* 87* 83*  CREATININE 3.99* 4.19* 4.48* 4.19* 3.90*  CALCIUM 7.1* 7.5* 7.5* 7.6* 8.0*  PHOS 9.1* 9.9* 10.2* 9.7* 7.8*   GFR: Estimated Creatinine Clearance: 30.9 mL/min (A) (by C-G formula based on SCr of 3.9 mg/dL (H)).  Liver Function Tests: Recent Labs  Lab 06/19/22 0445 06/20/22 0500 06/21/22 0431 06/22/22 0353 06/23/22 0408  ALBUMIN 2.9* 3.0* 3.0* 2.9* 2.9*   Coagulation Profile: Recent Labs  Lab 06/18/22 0527  INR 1.2   Sepsis Labs:  Recent Results (from the past 240 hour(s))  Urine Culture     Status: Abnormal   Collection Time: 06/15/22  1:20 AM   Specimen: Urine, Clean Catch  Result Value Ref Range Status   Specimen Description   Final    URINE, CLEAN CATCH Performed at [facility], [address], [place], [place] [address]    Special Requests   Final    NONE Performed at [facility], [address], [place], [place] [address]    Culture (A)   Final    10,000 COLONIES/mL GROUP B STREP(S.AGALACTIAE)ISOLATED TESTING AGAINST S. AGALACTIAE NOT ROUTINELY PERFORMED DUE TO PREDICTABILITY OF AMP/PEN/VAN SUSCEPTIBILITY. Performed at [facility], [place] [address], [place], [place] [address]    Report Status 06/16/2022 FINAL  Final  Culture, blood (Routine X 2) w Reflex to ID Panel     Status: Abnormal (Preliminary result)   Collection Time: 06/21/22 10:47 AM   Specimen: BLOOD  Result Value Ref Range Status   Specimen Description   Final    BOTTLES DRAWN AEROBIC AND ANAEROBIC Blood Culture adequate volume BLOOD LEFT ARM LEFT ANTECUBITAL Performed at [facility], [place] [address], [place], [place] [address]    Special Requests   Final    NONE Performed at [facility], [address] [place], [place] [address]    Culture  Setup Time   Final    GRAM POSITIVE COCCI ANAEROBIC BOTTLE ONLY Gram Stain Report Called to,Read Back By and Verified With: Ninfa Meeker RN [address] [place] 06/23/22 @0022  BY AB  Culture (A)  Final    STAPHYLOCOCCUS EPIDERMIDIS THE SIGNIFICANCE OF ISOLATING THIS ORGANISM FROM A SINGLE SET OF BLOOD CULTURES WHEN MULTIPLE SETS ARE DRAWN IS UNCERTAIN. PLEASE NOTIFY THE MICROBIOLOGY DEPARTMENT WITHIN ONE WEEK IF SPECIATION AND SENSITIVITIES ARE REQUIRED. Performed at [facility], [place] [address], [place], [place] [address]    Report Status PENDING  Incomplete  Blood Culture ID Panel (Reflexed)     Status: Abnormal   Collection Time: 06/21/22 10:47 AM  Result Value Ref Range Status   Enterococcus faecalis NOT DETECTED NOT DETECTED Final   Enterococcus Faecium NOT DETECTED NOT DETECTED Final   Listeria monocytogenes NOT DETECTED NOT DETECTED Final   Staphylococcus species DETECTED (A) NOT DETECTED Final    Comment: CRITICAL RESULT CALLED TO, READ BACK BY AND VERIFIED WITH:  RN C. MARSH 06/23/22 @0022  BY AB    Staphylococcus aureus (BCID) NOT DETECTED NOT DETECTED Final   Staphylococcus  epidermidis DETECTED (A) NOT DETECTED Final    Comment: CRITICAL RESULT CALLED TO, READ BACK BY AND VERIFIED WITH:  RN C. MARSH 06/23/22 @0022  BY AB    Staphylococcus lugdunensis NOT DETECTED NOT DETECTED Final   Streptococcus species NOT DETECTED NOT DETECTED Final   Streptococcus agalactiae NOT DETECTED NOT DETECTED Final   Streptococcus pneumoniae NOT DETECTED NOT DETECTED Final   Streptococcus pyogenes NOT DETECTED NOT DETECTED Final   A.calcoaceticus-baumannii NOT DETECTED NOT DETECTED Final   Bacteroides fragilis NOT DETECTED NOT DETECTED Final   Enterobacterales NOT DETECTED NOT DETECTED Final   Enterobacter cloacae complex NOT DETECTED NOT DETECTED Final   Escherichia coli NOT DETECTED NOT DETECTED Final   Klebsiella aerogenes NOT DETECTED NOT DETECTED Final   Klebsiella oxytoca NOT DETECTED NOT DETECTED Final   Klebsiella pneumoniae NOT DETECTED NOT DETECTED Final   Proteus species NOT DETECTED NOT DETECTED Final   Salmonella species NOT DETECTED NOT DETECTED Final   Serratia marcescens NOT DETECTED NOT DETECTED Final   Haemophilus influenzae NOT DETECTED NOT DETECTED Final   Neisseria meningitidis NOT DETECTED NOT DETECTED Final   Pseudomonas aeruginosa NOT DETECTED NOT DETECTED Final   Stenotrophomonas maltophilia NOT DETECTED NOT DETECTED Final   Candida albicans NOT DETECTED NOT DETECTED Final   Candida auris NOT DETECTED NOT DETECTED Final   Candida glabrata NOT DETECTED NOT DETECTED Final   Candida krusei NOT DETECTED NOT DETECTED Final   Candida parapsilosis NOT DETECTED NOT DETECTED Final   Candida tropicalis NOT DETECTED NOT DETECTED Final   Cryptococcus neoformans/gattii NOT DETECTED NOT DETECTED Final   Methicillin resistance mecA/C NOT DETECTED NOT DETECTED Final    Comment: Performed at [facility], [address], [place], [place] [address]  Urine Culture     Status: None   Collection Time: 06/21/22 10:48 AM   Specimen: Urine, Clean Catch  Result  Value Ref Range Status   Specimen Description   Final    URINE, CLEAN CATCH Performed at [facility], [address], [place], [place] [address]    Special Requests   Final    NONE Performed at [facility], [address], [place], [place] [address]    Culture   Final    NO GROWTH Performed at [facility], [place] [address] [place], [place] [address]    Report Status 06/22/2022 FINAL  Final  Culture, blood (Routine X 2) w Reflex to ID Panel     Status: None (Preliminary result)   Collection Time: 06/21/22 10:55 AM   Specimen: BLOOD  Result Value Ref Range Status   Specimen Description   Final    BLOOD BLOOD LEFT HAND BOTTLES DRAWN AEROBIC AND ANAEROBIC Blood Culture adequate volume   Special Requests NONE  Final   Culture   Final    NO GROWTH 2 DAYS Performed at [facility], [address], [place], [place] [address]    Report Status PENDING  Incomplete    Radiology Studies: No results found.  Scheduled Meds:  ferric citrate  420 mg Oral TID WC   ferrous sulfate  325 mg Oral BID WC   labetalol  400 mg Oral TID   pantoprazole  40 mg Oral Daily   Continuous Infusions:  sodium chloride 10 mL/hr at 06/23/22 1514    LOS: 9 days   Roxan Hockey, MD [facility]   If 7PM-7AM, please contact night-coverage [email or web address]  06/23/2022, 7:38 PM

## 2022-06-24 DIAGNOSIS — D649 Anemia, unspecified: Secondary | ICD-10-CM | POA: Diagnosis not present

## 2022-06-24 LAB — CULTURE, BLOOD (ROUTINE X 2)

## 2022-06-24 LAB — RENAL FUNCTION PANEL
Albumin: 3 g/dL — ABNORMAL LOW (ref 3.5–5.0)
Anion gap: 9 (ref 5–15)
BUN: 79 mg/dL — ABNORMAL HIGH (ref 6–20)
CO2: 25 mmol/L (ref 22–32)
Calcium: 8.5 mg/dL — ABNORMAL LOW (ref 8.9–10.3)
Chloride: 106 mmol/L (ref 98–111)
Creatinine, Ser: 3.54 mg/dL — ABNORMAL HIGH (ref 0.44–1.00)
GFR, Estimated: 16 mL/min — ABNORMAL LOW (ref >60)
Glucose, Bld: 118 mg/dL — ABNORMAL HIGH (ref 70–99)
Phosphorus: 7.3 mg/dL — ABNORMAL HIGH (ref 2.5–4.6)
Potassium: 4.2 mmol/L (ref 3.5–5.1)
Sodium: 140 mmol/L (ref 135–145)

## 2022-06-24 LAB — CBC
HCT: 26.4 % — ABNORMAL LOW (ref 36.0–46.0)
Hemoglobin: 7.5 g/dL — ABNORMAL LOW (ref 12.0–15.0)
MCH: 22.5 pg — ABNORMAL LOW (ref 26.0–34.0)
MCHC: 28.4 g/dL — ABNORMAL LOW (ref 30.0–36.0)
MCV: 79 fL — ABNORMAL LOW (ref 80.0–100.0)
Platelets: 218 10*3/uL (ref 150–400)
RBC: 3.34 MIL/uL — ABNORMAL LOW (ref 3.87–5.11)
RDW: 22.4 % — ABNORMAL HIGH (ref 11.5–15.5)
WBC: 6.9 10*3/uL (ref 4.0–10.5)
nRBC: 0 % (ref 0.0–0.2)

## 2022-06-24 LAB — GLUCOSE, CAPILLARY
Glucose-Capillary: 137 mg/dL — ABNORMAL HIGH (ref 70–99)
Glucose-Capillary: 148 mg/dL — ABNORMAL HIGH (ref 70–99)

## 2022-06-24 MED ORDER — CALCIUM ACETATE 667 MG PO TABS: 1.0000 | ORAL_TABLET | Freq: Three times a day (TID) | ORAL | 5 refills | Status: AC

## 2022-06-24 MED ORDER — LABETALOL HCL 300 MG PO TABS: 300.0000 mg | ORAL_TABLET | Freq: Two times a day (BID) | ORAL | 5 refills | Status: AC

## 2022-06-24 MED ORDER — PRENATAL PLUS VITAMIN/MINERAL 27-1 MG PO TABS: 1.0000 | ORAL_TABLET | ORAL | 3 refills | Status: AC

## 2022-06-24 MED ORDER — ONDANSETRON HCL 4 MG PO TABS: 4.0000 mg | ORAL_TABLET | Freq: Four times a day (QID) | ORAL | 0 refills | Status: AC | PRN

## 2022-06-24 MED ORDER — FERROUS SULFATE 325 (65 FE) MG PO TABS: 325.0000 mg | ORAL_TABLET | Freq: Every day | ORAL | 5 refills | Status: AC

## 2022-06-24 MED ORDER — GLIMEPIRIDE 2 MG PO TABS: 2.0000 mg | ORAL_TABLET | Freq: Every day | ORAL | 5 refills | Status: AC

## 2022-06-24 MED ORDER — AMLODIPINE BESYLATE 5 MG PO TABS: 5.0000 mg | ORAL_TABLET | Freq: Every day | ORAL | 5 refills | Status: AC

## 2022-06-24 NOTE — Plan of Care (Signed)
Pt is alert and oriented x 4. Up adlib. VS stable.  Problem: Education: Goal: Knowledge of General Education information will improve Description: Including pain rating scale, medication(s)/side effects and non-pharmacologic comfort measures Outcome: Progressing   Problem: Health Behavior/Discharge Planning: Goal: Ability to manage health-related needs will improve Outcome: Progressing   Problem: Clinical Measurements: Goal: Ability to maintain clinical measurements within normal limits will improve Outcome: Progressing Goal: Will remain free from infection Outcome: Progressing Goal: Diagnostic test results will improve Outcome: Progressing Goal: Respiratory complications will improve Outcome: Progressing Goal: Cardiovascular complication will be avoided Outcome: Progressing   Problem: Activity: Goal: Risk for activity intolerance will decrease Outcome: Progressing   Problem: Nutrition: Goal: Adequate nutrition will be maintained Outcome: Progressing   Problem: Coping: Goal: Level of anxiety will decrease Outcome: Progressing   Problem: Elimination: Goal: Will not experience complications related to bowel motility Outcome: Progressing Goal: Will not experience complications related to urinary retention Outcome: Progressing   Problem: Pain Managment: Goal: General experience of comfort will improve Outcome: Progressing   Problem: Safety: Goal: Ability to remain free from injury will improve Outcome: Progressing   Problem: Skin Integrity: Goal: Risk for impaired skin integrity will decrease Outcome: Progressing

## 2022-06-24 NOTE — Discharge Summary (Signed)
[name], is a 36 y.o. female  DOB 01-29-1986  MRN [id number].  Admission date:  06/14/2022  Admitting Physician  Rolla Plate, DO  Discharge Date:  06/24/2022   Primary MD  Patient, No Pcp Per  Recommendations for primary care physician for things to follow:   1)Follow up with nephrologist Dr. Dorothy Spark Office -[address] [place]na [place], [facility]  or [place], [place], Phone # Phone: [phone]  2)Repeat CBC and BMP blood tests around Tuesday, 06/27/2022  3)Avoid ibuprofen/Advil/Aleve/Motrin/Goody Powders/Naproxen/BC powders/Meloxicam/Diclofenac/Indomethacin and other Nonsteroidal anti-inflammatory medications as these will make you more likely to bleed and can cause stomach ulcers, can also cause Kidney problems.   Admission Diagnosis  Nephrotic syndrome [N04.9] Symptomatic anemia [D64.9]   Discharge Diagnosis  Nephrotic syndrome [N04.9] Symptomatic anemia [D64.9]    Principal Problem:   Symptomatic anemia Active Problems:   AKI (acute kidney injury) ([place])   Hyperkalemia   HTN (hypertension)   UTI (urinary tract infection)   Peripheral edema      Past Medical History:  Diagnosis Date   Anemia    Gestational diabetes    diet controlled   Gestational diabetes mellitus, antepartum    Granuloma, skin    incisional pfannenstiel scar   Hypertension    Preterm labor    Supervision of other high-risk pregnancy 05/26/2015    Clinic [facility]  Initiated Care at   63 weeks  FOB Lilla Shook  Dating By LMP/8 wek [place]  Pap 05/26/2015 neg  GC/CT Initial: -/-               36+wks:  Genetic Screen NT/IT: neg  CF screen Pos  FOB:  Anatomic [place] Female, limited view of head, repeat:  Flu vaccine 05/26/2015   Tdap Recommended ~ 28wks  Glucose Screen  2 hr  GBS   Feed Preference bottle  Contraception undecided  Circumcision yes  Childbirth Classes  declined  Pediatrician [facility]        Past Surgical History:  Procedure Laterality Date   ABDOMINAL SURGERY     TUMMY TUCK EXCISED ENDOMETRIOSIS   CESAREAN SECTION     CESAREAN SECTION N/A 12/14/2015   Procedure: CESAREAN SECTION;  Surgeon: Florian Buff, MD;  Location: [place];  Service: Obstetrics;  Laterality: N/A;   ENDOMETRIAL FULGURATION  07/07/11   NASAL SINUS SURGERY  2007   TONSILLECTOMY  2008   TUBAL LIGATION Bilateral 12/14/2015   Procedure: BILATERAL TUBAL LIGATION;  Surgeon: Florian Buff, MD;  Location: [place];  Service: Obstetrics;  Laterality: Bilateral;  fallopian tubes       HPI  from the history and physical done on the day of admission:      HPI: [name] is a 36 y.o. female with medical history significant of anemia, gestational diabetes, gestational hypertension, and more presents the ED with a chief complaint of dyspnea and peripheral edema.  Patient reports that the peripheral edema started a couple days ago.  At first she noticed at the ankles.  Yesterday had moved all the way up to her neck.  She reports new periorbital edema.  She has never had swelling like this before.  During pregnancy she had peripheral edema at the ankles before, but never this extensive.  Patient reports that her urine output has been decreased as well.  Its been about 12 hours since she has had any urine output despite being given Lasix a couple of hours ago.  She denies any dysuria.  She is not sure if she has hematuria because she has been on her period.  Patient does report that she has had epistaxis for about 1 week.  Sometimes she had blood trickling out of her nose, sometimes it was just when she blew her nose.  She reports even on the days that it was not trickling out of her nose she can feel it draining down her throat.  Was only on the left side.  Patient reports she has had nausea and vomiting, but denies hematemesis.  She reports she was  vomiting every 30 minutes for 5 hours.  Zofran finally aborted the nausea.  Eating had no effect on the nausea or vomiting.  Patient reports 2-3 times per day diarrhea.  No hematochezia or melena.  Patient denies abdominal pain.  She has had no back pain or flank pain.  She had no fever.  She reports dyspnea.  She noticed the dyspnea about a week ago.  She had associated heaviness in her chest.  Troponins normal at 6, 8.  Patient reports orthopnea and has been propping herself up on pillows.  Her dyspnea is worse with exertion as well.  Patient reports she is never had anything like this before.  Patient has not had any recent medication changes.  Patient reports that she does not take a single medication daily.  She does not use NSAIDs.  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  Her symptoms is Getting worse.  She had no recent travel.  No recent vaccines.   Patient does not smoke, does not drink alcohol, does not use illicit drugs.  She is not vaccinated for COVID.  Patient is full code.   Patient has no further complaints at this time. Review of Systems: As mentioned in the history of present illness. All other systems reviewed and are negative.     Hospital Course:   Brief Narrative:  36 year old female, admitted to the hospital with dyspnea on exertion.  Found to have elevated creatinine of 4.0.  She is noted to have proteinuria.  She was also anemic with a hemoglobin of 6.7.  She was transfused PRBC.  Nephrology following.  She is undergoing further work-up for renal failure as well as attempted diuresis. 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    A/p Symptomatic iron deficiency anemia -Unable to determine if this is acute anemia or acute on chronic as no recent Hgb values available  for comparison -Possibly related to blood loss menorrhagia, may have underlying renal dysfunction as well contributing -Hemoglobin currently above 7 after transfusion of 1 unit of PRBC, she did not complete second unit of PRBC due to pruritus -Haptoglobin is not significantly low -no overt bleeding appreciated. -Patient received IV iron and Aranesp -Discharge on p.o. 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   -- ANA and ASO positive low C3, normal C4,; ANA titer in process.  Renal ultrasound looks normal w/ no e/o obstruction.     06/24/22 -Urine output continues to improve creatinine appears to be trending back down again after initially rising -Creatinine currently down to 3.5 -Per nephrologist okay to discharge home and follow-up as outpatient with Dr. Marval Regal -Repeat BMP in about 3 days advised   Hyperkalemia -Resolved with treatment   Hyperphosphatemia -Started on ferric citrate in-house, okay to discharge on calcium acetate due to cost concerns   Hypoalbuminemia -Although she does have proteinuria, does not appear to be nephrotic range   Volume overload/edema most likely related to underlying renal disorder -Initially treated with high-dose IV Lasix as part of treatment for anasarca. -Echocardiogram shows normal ejection fraction -Nephrology recommends gentle hydration with IV fluids   Hypertension -Continue to trend blood pressures with diuresis -She is not on any chronic meds prior to  admission -Heart healthy/low-sodium discussed with patient. -Discharged on labetalol and amlodipine   Obesity, class III -Body mass index is 47.69 kg/m.  -Continue to monitor weight with diuresis -Low calorie diet, portion control and increase physical activity discussed with patient.   Type 2 diabetes -A1C 6.7 -Discharged on Amaryl with breakfast    Discharge Condition: stable  Follow UP   Follow-up Information     Donato Heinz, MD. Schedule an appointment as soon as possible for a visit on 06/27/2022.   Specialty: Nephrology Why: Repeat CBC and BMP Blood tests Contact information: [address] [place] [address] [phone]                  Consults obtained - Nephrology/IR  Diet and Activity recommendation:  As advised  Discharge Instructions    Discharge Instructions     Call MD for:  difficulty breathing, headache or visual disturbances   Complete by: As directed    Call MD for:  persistant dizziness or light-headedness   Complete by: As directed    Call MD for:  persistant nausea and vomiting   Complete by: As directed    Call MD for:  temperature >100.4   Complete by: As directed    Diet - low sodium heart healthy   Complete by: As directed    Discharge instructions   Complete by: As directed    1)Follow up with nephrologist Dr. Dorothy Spark Office -[address] [place]na [place], [place]  or [place], [place], Phone # Phone: [phone]  2)Repeat CBC and BMP blood tests around Tuesday, 06/27/2022  3)Avoid ibuprofen/Advil/Aleve/Motrin/Goody Powders/Naproxen/BC powders/Meloxicam/Diclofenac/Indomethacin and other Nonsteroidal anti-inflammatory medications as these will make you more likely to bleed and can cause stomach ulcers, can also cause Kidney problems.   Increase activity slowly   Complete by: As directed    No wound care   Complete by: As directed        Discharge Medications      Allergies as of 06/24/2022       Reactions   Adhesive [tape] Other (See Comments)   Causes blistering of skin  Promethazine Hcl Nausea And Vomiting   Vicodin [hydrocodone-acetaminophen] Nausea And Vomiting        Medication List     STOP taking these medications    doxycycline 100 MG capsule Commonly known as: VIBRAMYCIN   hydrochlorothiazide 12.5 MG tablet Commonly known as: HYDRODIURIL   ibuprofen 600 MG tablet Commonly known as: ADVIL   oxyCODONE-acetaminophen 5-325 MG tablet Commonly known as: PERCOCET/ROXICET   PRENATAL VITAMIN PO Replaced by: Prenatal Plus Vitamin/Mineral 27-1 MG Tabs       TAKE these medications    amLODipine 5 MG tablet Commonly known as: NORVASC Take 1 tablet (5 mg total) by mouth daily. For BP   Calcium Acetate 667 MG Tabs Take 1 tablet by mouth 3 (three) times daily before meals.   ferrous sulfate 325 (65 FE) MG tablet Take 1 tablet (325 mg total) by mouth daily with breakfast. What changed: when to take this   glimepiride 2 MG tablet Commonly known as: Amaryl Take 1 tablet (2 mg total) by mouth daily with breakfast. For Blood Sugar   labetalol 300 MG tablet Commonly known as: NORMODYNE Take 1 tablet (300 mg total) by mouth 2 (two) times daily. For BP What changed:  medication strength how much to take additional instructions   ondansetron 4 MG tablet Commonly known as: ZOFRAN Take 1 tablet (4 mg total) by mouth every 6 (six) hours as needed for nausea.   Prenatal Plus Vitamin/Mineral 27-1 MG Tabs Take 1 tablet by mouth every 30 (thirty) days. Replaces: PRENATAL VITAMIN PO       Major procedures and Radiology Reports - PLEASE review detailed and final reports for all details, in brief -  CT RENAL BIOPSY  Result Date: 06/19/2022 INDICATION: Proteinuria Hematuria Renal failure EXAM: CT-guided random renal biopsy TECHNIQUE: Multidetector CT imaging of the abdomen was performed following the standard protocol without  IV contrast. RADIATION DOSE REDUCTION: This exam was performed according to the departmental dose-optimization program which includes automated exposure control, adjustment of the mA and/or kV according to patient size and/or use of iterative reconstruction technique. MEDICATIONS: None. ANESTHESIA/SEDATION: Moderate (conscious) sedation was employed during this procedure. A total of Versed 1 mg and Fentanyl 50 mcg was administered intravenously by the radiology nurse. Total intra-service moderate Sedation Time: 15 minutes. The patient's level of consciousness and vital signs were monitored continuously by radiology nursing throughout the procedure under my direct supervision. COMPLICATIONS: None immediate. PROCEDURE: Informed written consent was obtained from the patient after a thorough discussion of the procedural risks, benefits and alternatives. All questions were addressed. Maximal Sterile Barrier Technique was utilized including caps, mask, sterile gowns, sterile gloves, sterile drape, hand hygiene and skin antiseptic. A timeout was performed prior to the initiation of the procedure. Patient positioned prone on the CT table. The left flank skin prepped and draped usual fashion. Following local administration, 17 gauge introducer needle was advanced into the lower pole of the left kidney and 2-18 gauge cores were obtained. All samples sent to pathology in sterile saline. Gel-Foam slurry was administered through the 17 gauge guiding needle. 5 minutes of manual compression was applied at the biopsy site for hemostasis. Post biopsy CT showed no significant hemorrhage. IMPRESSION: CT-guided random renal biopsy. Electronically Signed   By: Miachel Roux M.D.   On: 06/19/2022 12:22   US RENAL  Result Date: 06/15/2022 CLINICAL DATA:  Acute kidney injury. EXAM: RENAL / URINARY TRACT ULTRASOUND COMPLETE COMPARISON:  CT abdomen pelvis 06/14/2022 FINDINGS: Right Kidney: Renal measurements: 13  x 5.6 x 6.8 cm = volume:  259 mL. Echogenicity within normal limits. No mass or hydronephrosis visualized. Left Kidney: Renal measurements: 12.4 x 5.0 x 6.7 cm = volume: 217 mL. Echogenicity within normal limits. No mass or hydronephrosis visualized. Bladder: Appears normal for degree of bladder distention. Other: Small left pleural effusion. IMPRESSION: 1. Normal renal ultrasound. 2. Small left pleural effusion Electronically Signed   By: Franchot Gallo M.D.   On: 06/15/2022 11:42   ECHOCARDIOGRAM COMPLETE  Result Date: 06/15/2022    ECHOCARDIOGRAM REPORT   Patient Name:   [name] Date of Exam: 06/15/2022 Medical Rec #:  [id number]      Height:       69.0 in Accession #:    [id number]     Weight:       345.0 lb Date of Birth:  [birth year]      BSA:          2.605 m Patient Age:    32 years       BP:           125/80 mmHg Patient Gender: F              HR:           82 bpm. Exam Location:  [facility] Procedure: 2D Echo, Cardiac Doppler and Color Doppler Indications:    Dyspnea  History:        Patient has no prior history of Echocardiogram examinations.                 Risk Factors:Hypertension and Diabetes.  Sonographer:    Wenda Low Referring Phys: [id number] ASIA B [place]  Sonographer Comments: Patient is obese. IMPRESSIONS  1. Left ventricular ejection fraction, by estimation, is 60 to 65%. The left ventricle has normal function. The left ventricle has no regional wall motion abnormalities. Left ventricular diastolic parameters were normal.  2. Right ventricular systolic function is normal. The right ventricular size is normal. There is mildly elevated pulmonary artery systolic pressure.  3. The mitral valve is grossly normal. Trivial mitral valve regurgitation. No evidence of mitral stenosis.  4. The aortic valve is normal in structure. Aortic valve regurgitation is not visualized. No aortic stenosis is present.  5. The inferior vena cava is normal in size with greater than 50% respiratory variability, suggesting right  atrial pressure of 3 mmHg. Comparison(s): No prior Echocardiogram. FINDINGS  Left Ventricle: Left ventricular ejection fraction, by estimation, is 60 to 65%. The left ventricle has normal function. The left ventricle has no regional wall motion abnormalities. The left ventricular internal cavity size was normal in size. There is  borderline left ventricular hypertrophy. Left ventricular diastolic parameters were normal. Right Ventricle: The right ventricular size is normal. No increase in right ventricular wall thickness. Right ventricular systolic function is normal. There is mildly elevated pulmonary artery systolic pressure. The tricuspid regurgitant velocity is 2.88  m/s, and with an assumed right atrial pressure of 3 mmHg, the estimated right ventricular systolic pressure is 17.0 mmHg. Left Atrium: Left atrial size was normal in size. Right Atrium: Right atrial size was normal in size. Pericardium: There is no evidence of pericardial effusion. Mitral Valve: The mitral valve is grossly normal. Trivial mitral valve regurgitation. No evidence of mitral valve stenosis. MV peak gradient, 7.5 mmHg. The mean mitral valve gradient is 4.0 mmHg. Tricuspid Valve: The tricuspid valve is grossly normal. Tricuspid valve regurgitation is trivial. No evidence of tricuspid stenosis. Aortic Valve:  The aortic valve is normal in structure. Aortic valve regurgitation is not visualized. No aortic stenosis is present. Aortic valve mean gradient measures 4.0 mmHg. Aortic valve peak gradient measures 8.2 mmHg. Aortic valve area, by VTI measures 2.31 cm. Pulmonic Valve: The pulmonic valve was not well visualized. Pulmonic valve regurgitation is not visualized. No evidence of pulmonic stenosis. Aorta: The aortic root is normal in size and structure. Venous: The inferior vena cava is normal in size with greater than 50% respiratory variability, suggesting right atrial pressure of 3 mmHg. IAS/Shunts: No atrial level shunt detected by  color flow Doppler.  LEFT VENTRICLE PLAX 2D LVIDd:         5.20 cm   Diastology LVIDs:         3.40 cm   LV e' medial:    14.40 cm/s LV PW:         1.20 cm   LV E/e' medial:  9.6 LV IVS:        1.20 cm   LV e' lateral:   13.40 cm/s LVOT diam:     2.00 cm   LV E/e' lateral: 10.3 LV SV:         71 LV SV Index:   27 LVOT Area:     3.14 cm  RIGHT VENTRICLE RV Basal diam:  3.40 cm RV Mid diam:    3.00 cm RV S prime:     16.30 cm/s TAPSE (M-mode): 3.2 cm LEFT ATRIUM             Index        RIGHT ATRIUM           Index LA diam:        4.60 cm 1.77 cm/m   RA Area:     21.30 cm LA Vol (A2C):   85.0 ml 32.63 ml/m  RA Volume:   69.10 ml  26.53 ml/m LA Vol (A4C):   70.8 ml 27.18 ml/m LA Biplane Vol: 83.8 ml 32.17 ml/m  AORTIC VALVE                    PULMONIC VALVE AV Area (Vmax):    2.26 cm     PV Vmax:       1.08 m/s AV Area (Vmean):   2.18 cm     PV Peak grad:  4.7 mmHg AV Area (VTI):     2.31 cm AV Vmax:           143.00 cm/s AV Vmean:          97.800 cm/s AV VTI:            0.308 m AV Peak Grad:      8.2 mmHg AV Mean Grad:      4.0 mmHg LVOT Vmax:         103.00 cm/s LVOT Vmean:        68.000 cm/s LVOT VTI:          0.226 m LVOT/AV VTI ratio: 0.73  AORTA Ao Root diam: 3.30 cm MITRAL VALVE                TRICUSPID VALVE MV Area (PHT): 4.04 cm     TR Peak grad:   33.2 mmHg MV Area VTI:   2.09 cm     TR Vmax:        288.00 cm/s MV Peak grad:  7.5 mmHg MV Mean grad:  4.0 mmHg     SHUNTS MV  Vmax:       1.37 m/s     Systemic VTI:  0.23 m MV Vmean:      91.7 cm/s    Systemic Diam: 2.00 cm MV Decel Time: 188 msec MV E velocity: 138.00 cm/s MV A velocity: 91.70 cm/s MV E/A ratio:  1.50 Vishnu Priya Mallipeddi Electronically signed by Lorelee Cover Mallipeddi Signature Date/Time: 06/15/2022/10:53:48 AM    Final    DG Chest Port 1 View  Result Date: 06/15/2022 CLINICAL DATA:  Bilateral leg swelling.  Chest pain. EXAM: PORTABLE CHEST 1 VIEW COMPARISON:  06/14/2022 FINDINGS: 0516 hours. Low volume film. The cardio  pericardial silhouette is enlarged. There is pulmonary vascular congestion without overt pulmonary edema. Bibasilar atelectasis/infiltrate noted, left greater than right with small bilateral pleural effusions, similar to prior. Telemetry leads overlie the chest. IMPRESSION: Low volume film with bibasilar atelectasis/infiltrate and small bilateral pleural effusions, left greater than right. Electronically Signed   By: Misty Stanley M.D.   On: 06/15/2022 05:39   CT Renal Stone Study  Result Date: 06/14/2022 CLINICAL DATA:  Swelling low hemoglobin elevated creatinine EXAM: CT ABDOMEN AND PELVIS WITHOUT CONTRAST TECHNIQUE: Multidetector CT imaging of the abdomen and pelvis was performed following the standard protocol without IV contrast. RADIATION DOSE REDUCTION: This exam was performed according to the departmental dose-optimization program which includes automated exposure control, adjustment of the mA and/or kV according to patient size and/or use of iterative reconstruction technique. COMPARISON:  CT 08/08/2011 FINDINGS: Lower chest: Lung bases demonstrate small bilateral pleural effusions. Probable passive atelectasis in the left lower lobe. Upper normal cardiac size. Hepatobiliary: No focal liver abnormality is seen. No gallstones, gallbladder wall thickening, or biliary dilatation. Pancreas: Unremarkable. No pancreatic ductal dilatation or surrounding inflammatory changes. Spleen: Enlarged up to 16.3 cm. Adrenals/Urinary Tract: Adrenal glands are unremarkable. Kidneys are normal, without renal calculi, focal lesion, or hydronephrosis. Bladder is unremarkable. Stomach/Bowel: Stomach is within normal limits. Appendix appears normal. No evidence of bowel wall thickening, distention, or inflammatory changes. Vascular/Lymphatic: No significant vascular findings are present. No enlarged abdominal or pelvic lymph nodes. Reproductive: Uterus and bilateral adnexa are unremarkable. Other: Negative for pelvic effusion  or free air. Small fat containing umbilical hernia. Edema within the subcutaneous soft tissues of the abdominal wall. Musculoskeletal: No acute osseous abnormality. IMPRESSION: 1. No CT evidence for acute intra-abdominal or pelvic abnormality. 2. Small bilateral pleural effusions. Mild diffuse subcutaneous edema. 3. Spleen appears slightly enlarged Electronically Signed   By: Donavan Foil M.D.   On: 06/14/2022 22:14   DG Chest 2 View  Result Date: 06/14/2022 CLINICAL DATA:  Chest pain EXAM: CHEST - 2 VIEW COMPARISON:  09/23/2018 FINDINGS: Small bilateral pleural effusions. Diffuse increased interstitial opacity could reflect low-grade edema. Patchy airspace opacity at the bases. Normal cardiac size. No pneumothorax IMPRESSION: Small bilateral pleural effusions with patchy airspace disease at the bases which may be due to atelectasis or pneumonia. Diffuse increased interstitial opacity could reflect low-grade edema. Electronically Signed   By: Donavan Foil M.D.   On: 06/14/2022 19:59    Micro Results   Recent Results (from the past 240 hour(s))  Urine Culture     Status: Abnormal   Collection Time: 06/15/22  1:20 AM   Specimen: Urine, Clean Catch  Result Value Ref Range Status   Specimen Description   Final    URINE, CLEAN CATCH Performed at [facility], [address], [place], [place] [address]    Special Requests   Final  NONE Performed at [facility], [address] [place], [place] [address]    Culture (A)  Final    10,000 COLONIES/mL GROUP B STREP(S.AGALACTIAE)ISOLATED TESTING AGAINST S. AGALACTIAE NOT ROUTINELY PERFORMED DUE TO PREDICTABILITY OF AMP/PEN/VAN SUSCEPTIBILITY. Performed at [facility], [place] [address], [place], [place] [address]    Report Status 06/16/2022 FINAL  Final  Culture, blood (Routine X 2) w Reflex to ID Panel     Status: Abnormal   Collection Time: 06/21/22 10:47 AM   Specimen: BLOOD  Result Value Ref Range Status   Specimen Description   Final     BOTTLES DRAWN AEROBIC AND ANAEROBIC Blood Culture adequate volume BLOOD LEFT ARM LEFT ANTECUBITAL Performed at [facility], [place] [address], [place], [place] [address]    Special Requests   Final    NONE Performed at [facility], [address], [place], [place] [address]    Culture  Setup Time   Final    GRAM POSITIVE COCCI ANAEROBIC BOTTLE ONLY Gram Stain Report Called to,Read Back By and Verified With: Ninfa Meeker RN [phone] K FORSYTH  RN C. MARSH 06/23/22 @0022  BY AB    Culture (A)  Final    STAPHYLOCOCCUS EPIDERMIDIS THE SIGNIFICANCE OF ISOLATING THIS ORGANISM FROM A SINGLE SET OF BLOOD CULTURES WHEN MULTIPLE SETS ARE DRAWN IS UNCERTAIN. PLEASE NOTIFY THE MICROBIOLOGY DEPARTMENT WITHIN ONE WEEK IF SPECIATION AND SENSITIVITIES ARE REQUIRED. Performed at [facility], [place] [address] [place], [place] [address]    Report Status 06/24/2022 FINAL  Final  Blood Culture ID Panel (Reflexed)     Status: Abnormal   Collection Time: 06/21/22 10:47 AM  Result Value Ref Range Status   Enterococcus faecalis NOT DETECTED NOT DETECTED Final   Enterococcus Faecium NOT DETECTED NOT DETECTED Final   Listeria monocytogenes NOT DETECTED NOT DETECTED Final   Staphylococcus species DETECTED (A) NOT DETECTED Final    Comment: CRITICAL RESULT CALLED TO, READ BACK BY AND VERIFIED WITH:  RN C. MARSH 06/23/22 @0022  BY AB    Staphylococcus aureus (BCID) NOT DETECTED NOT DETECTED Final   Staphylococcus epidermidis DETECTED (A) NOT DETECTED Final    Comment: CRITICAL RESULT CALLED TO, READ BACK BY AND VERIFIED WITH:  RN C. MARSH 06/23/22 @0022  BY AB    Staphylococcus lugdunensis NOT DETECTED NOT DETECTED Final   Streptococcus species NOT DETECTED NOT DETECTED Final   Streptococcus agalactiae NOT DETECTED NOT DETECTED Final   Streptococcus pneumoniae NOT DETECTED NOT DETECTED Final   Streptococcus pyogenes NOT DETECTED NOT DETECTED Final   A.calcoaceticus-baumannii NOT DETECTED NOT DETECTED  Final   Bacteroides fragilis NOT DETECTED NOT DETECTED Final   Enterobacterales NOT DETECTED NOT DETECTED Final   Enterobacter cloacae complex NOT DETECTED NOT DETECTED Final   Escherichia coli NOT DETECTED NOT DETECTED Final   Klebsiella aerogenes NOT DETECTED NOT DETECTED Final   Klebsiella oxytoca NOT DETECTED NOT DETECTED Final   Klebsiella pneumoniae NOT DETECTED NOT DETECTED Final   Proteus species NOT DETECTED NOT DETECTED Final   Salmonella species NOT DETECTED NOT DETECTED Final   Serratia marcescens NOT DETECTED NOT DETECTED Final   Haemophilus influenzae NOT DETECTED NOT DETECTED Final   Neisseria meningitidis NOT DETECTED NOT DETECTED Final   Pseudomonas aeruginosa NOT DETECTED NOT DETECTED Final   Stenotrophomonas maltophilia NOT DETECTED NOT DETECTED Final   Candida albicans NOT DETECTED NOT DETECTED Final   Candida auris NOT DETECTED NOT DETECTED Final   Candida glabrata NOT DETECTED NOT  DETECTED Final   Candida krusei NOT DETECTED NOT DETECTED Final   Candida parapsilosis NOT DETECTED NOT DETECTED Final   Candida tropicalis NOT DETECTED NOT DETECTED Final   Cryptococcus neoformans/gattii NOT DETECTED NOT DETECTED Final   Methicillin resistance mecA/C NOT DETECTED NOT DETECTED Final    Comment: Performed at [facility], [place] [address], [place], [place] [address]  Urine Culture     Status: None   Collection Time: 06/21/22 10:48 AM   Specimen: Urine, Clean Catch  Result Value Ref Range Status   Specimen Description   Final    URINE, CLEAN CATCH Performed at [facility], [address] [place], [place] [address]    Special Requests   Final    NONE Performed at [facility], [address] [place], [place] [address]    Culture   Final    NO GROWTH Performed at [facility], [place] [address], [place], [place] [address]    Report Status 06/22/2022 FINAL  Final  Culture, blood (Routine X 2) w Reflex to ID Panel     Status: None (Preliminary result)    Collection Time: 06/21/22 10:55 AM   Specimen: BLOOD  Result Value Ref Range Status   Specimen Description   Final    BLOOD BLOOD LEFT HAND BOTTLES DRAWN AEROBIC AND ANAEROBIC Blood Culture adequate volume   Special Requests NONE  Final   Culture   Final    NO GROWTH 3 DAYS Performed at [facility], [address], [place], [place] [address]    Report Status PENDING  Incomplete   Today   Subjective    [name] today has no new complaints  -mother at bedside No fever  Or chills  -Patient had nausea earlier.... Received Zofran now eating and drinking well without further nausea         Patient has been seen and examined prior to discharge   Objective   Blood pressure (!) 141/72, pulse 76, temperature 98.1 F (36.7 C), temperature source Oral, resp. rate 20, height 5\' 9"  (1.753 m), weight (!) 147.5 kg, last menstrual period 06/14/2022, SpO2 96 %, unknown if currently breastfeeding.   Intake/Output Summary (Last 24 hours) at 06/24/2022 1356 Last data filed at 06/24/2022 0900 Gross per 24 hour  Intake 1203.38 ml  Output 1250 ml  Net -46.62 ml    Exam Gen:- Awake Alert, no acute distress  HEENT:- [place].AT, No sclera icterus Neck-Supple Neck,No JVD,.  Lungs-  CTAB , good air movement bilaterally CV- S1, S2 normal, regular Abd-  +ve B.Sounds, Abd Soft, No tenderness, increased truncal adiposity noted -No CVA area tenderness    Extremity/Skin:- No  edema,   good pulses Psych-affect is appropriate, oriented x3 Neuro-no new focal deficits, no tremors    Data Review   CBC w Diff:  Lab Results  Component Value Date   WBC 6.9 06/24/2022   HGB 7.5 (L) 06/24/2022   HGB 11.5 05/31/2015   HCT 26.4 (L) 06/24/2022   HCT 36.5 05/31/2015   PLT 218 06/24/2022   PLT 221 05/31/2015   LYMPHOPCT 19 06/15/2022   MONOPCT 11 06/15/2022   EOSPCT 2 06/15/2022   BASOPCT 1 06/15/2022    CMP:  Lab Results  Component Value Date   NA 140 06/24/2022   K 4.2 06/24/2022   CL 106  06/24/2022   CO2 25 06/24/2022   BUN 79 (H) 06/24/2022   CREATININE 3.54 (H) 06/24/2022   CREATININE 0.58 03/06/2014  PROT 6.3 (L) 06/15/2022   ALBUMIN 3.0 (L) 06/24/2022   BILITOT 0.4 06/15/2022   ALKPHOS 80 06/15/2022   AST 12 (L) 06/15/2022   ALT 11 06/15/2022  .  Total Discharge time is about 33 minutes  Roxan Hockey M.D on 06/24/2022 at 1:56 PM  Go to [email or web address] -  for contact info  [facility] - Office  [phone]

## 2022-06-24 NOTE — Progress Notes (Signed)
Pt attempted to eat breakfast this am but became nauseated and vomited up food after a few bites. Pt states she had felt a little nauseated since awaking this am but the smell and texture of the food just made it worse. Pt denies need for anti-nausea med, states, "I feel better since I threw up once." Pt now slowly eating container of ice cream, states cold seem to be tolerated better. Mother remains at bedside.

## 2022-06-24 NOTE — Progress Notes (Addendum)
IV removed and discharge instructions reviewed. . Husband here now and taken by [facility] to car.

## 2022-06-24 NOTE — Progress Notes (Signed)
Pt states relief of nausea after admin of IV zofran. Pt then ate biscuit with no reoccurring nausea or vomiting. Able to tolerate po meds.

## 2022-06-24 NOTE — Discharge Instructions (Signed)
1)Follow up with nephrologist Dr. Dorothy Spark Office -[address] [place], [place]  or [place], [place], Phone # Phone: [phone]  2)Repeat CBC and BMP blood tests around Tuesday, 06/27/2022  3)Avoid ibuprofen/Advil/Aleve/Motrin/Goody Powders/Naproxen/BC powders/Meloxicam/Diclofenac/Indomethacin and other Nonsteroidal anti-inflammatory medications as these will make you more likely to bleed and can cause stomach ulcers, can also cause Kidney problems.

## 2022-06-26 LAB — CULTURE, BLOOD (ROUTINE X 2): Culture: NO GROWTH

## 2022-08-10 DIAGNOSIS — N2581 Secondary hyperparathyroidism of renal origin: Secondary | ICD-10-CM | POA: Diagnosis not present

## 2022-08-10 DIAGNOSIS — D631 Anemia in chronic kidney disease: Secondary | ICD-10-CM | POA: Diagnosis not present

## 2022-08-10 DIAGNOSIS — N189 Chronic kidney disease, unspecified: Secondary | ICD-10-CM | POA: Diagnosis not present

## 2022-08-10 DIAGNOSIS — E1122 Type 2 diabetes mellitus with diabetic chronic kidney disease: Secondary | ICD-10-CM | POA: Diagnosis not present

## 2022-08-10 DIAGNOSIS — I129 Hypertensive chronic kidney disease with stage 1 through stage 4 chronic kidney disease, or unspecified chronic kidney disease: Secondary | ICD-10-CM | POA: Diagnosis not present

## 2022-08-10 DIAGNOSIS — N184 Chronic kidney disease, stage 4 (severe): Secondary | ICD-10-CM | POA: Diagnosis not present

## 2022-08-10 DIAGNOSIS — N059 Unspecified nephritic syndrome with unspecified morphologic changes: Secondary | ICD-10-CM | POA: Diagnosis not present

## 2022-08-15 ENCOUNTER — Other Ambulatory Visit: Payer: Self-pay

## 2022-08-15 ENCOUNTER — Encounter (HOSPITAL_COMMUNITY): Payer: Self-pay | Admitting: Nephrology

## 2022-08-15 ENCOUNTER — Telehealth: Payer: Self-pay | Admitting: Pharmacy Technician

## 2022-08-15 DIAGNOSIS — D631 Anemia in chronic kidney disease: Secondary | ICD-10-CM

## 2022-08-15 DIAGNOSIS — N189 Chronic kidney disease, unspecified: Secondary | ICD-10-CM | POA: Insufficient documentation

## 2022-08-15 NOTE — Telephone Encounter (Addendum)
[name] is non preferred and will be denied if patient has not tried and or failed Venofer. Venofer is the preferred medication. REF: Jane-A 08/17/22 3:35P  Made 2 attempts to reach MD office. Phone: [phone] ext. [phone] Fax: [phone]  Patient has active insurance: [facility] [employer] ID: [id number] PHONE: [phone]

## 2022-08-22 ENCOUNTER — Other Ambulatory Visit: Payer: Self-pay

## 2022-08-24 ENCOUNTER — Encounter (HOSPITAL_COMMUNITY)
Admission: RE | Admit: 2022-08-24 | Discharge: 2022-08-24 | Disposition: A | Discharge status: Home / Self Care | Payer: Medicaid Other | Source: Ambulatory Visit | Attending: Nephrology | Admitting: Nephrology

## 2022-08-24 VITALS — BP 123/84 | HR 90 | Temp 98.2°F

## 2022-08-24 DIAGNOSIS — N189 Chronic kidney disease, unspecified: Secondary | ICD-10-CM | POA: Insufficient documentation

## 2022-08-24 DIAGNOSIS — D631 Anemia in chronic kidney disease: Secondary | ICD-10-CM | POA: Insufficient documentation

## 2022-08-24 MED ORDER — SODIUM CHLORIDE 0.9 % IV SOLN
200.0000 mg | Freq: Once | INTRAVENOUS | Status: AC
  Administered 2022-08-24: 200 mg via INTRAVENOUS
  Filled 2022-08-24: qty 10

## 2022-08-24 MED ORDER — ACETAMINOPHEN 325 MG PO TABS
650.0000 mg | ORAL_TABLET | Freq: Once | ORAL | Status: AC
  Administered 2022-08-24: 650 mg via ORAL
  Filled 2022-08-24: qty 2

## 2022-08-24 MED ORDER — DIPHENHYDRAMINE HCL 25 MG PO CAPS
25.0000 mg | ORAL_CAPSULE | Freq: Once | ORAL | Status: AC
  Administered 2022-08-24: 25 mg via ORAL
  Filled 2022-08-24: qty 1

## 2022-08-24 NOTE — Addendum Note (Signed)
Encounter addended by: Baxter Hire, RN on: 08/24/2022 1:57 PM  Actions taken: Therapy plan modified

## 2022-08-29 ENCOUNTER — Encounter (HOSPITAL_COMMUNITY)
Admission: RE | Admit: 2022-08-29 | Discharge: 2022-08-29 | Disposition: A | Discharge status: Home / Self Care | Payer: Medicaid Other | Source: Ambulatory Visit | Attending: Nephrology | Admitting: Nephrology

## 2022-08-29 VITALS — BP 111/60 | HR 85 | Temp 98.1°F | Resp 16

## 2022-08-29 DIAGNOSIS — D631 Anemia in chronic kidney disease: Secondary | ICD-10-CM

## 2022-08-29 MED ORDER — SODIUM CHLORIDE 0.9 % IV SOLN
200.0000 mg | Freq: Once | INTRAVENOUS | Status: AC
  Administered 2022-08-29: 200 mg via INTRAVENOUS
  Filled 2022-08-29: qty 200

## 2022-08-29 MED ORDER — ACETAMINOPHEN 325 MG PO TABS: 650.0000 mg | ORAL_TABLET | Freq: Once | ORAL | Status: DC

## 2022-08-29 MED ORDER — DIPHENHYDRAMINE HCL 25 MG PO CAPS: 25.0000 mg | ORAL_CAPSULE | Freq: Once | ORAL | Status: DC

## 2022-08-29 NOTE — Progress Notes (Signed)
Diagnosis: Iron Deficiency Anemia  Provider:  Donato Heinz MD  Procedure: Infusion  IV Type: Peripheral, IV Location: R Antecubital  Venofer (Iron Sucrose), Dose: 200 mg  Infusion Start Time: 1301  Infusion Stop Time: 9794  Post Infusion IV Care: Observation period completed and Peripheral IV Discontinued  Discharge: Condition: Good, Destination: Home . AVS provided to patient.   Performed by:  Hughie Closs, RN

## 2022-08-31 ENCOUNTER — Encounter (HOSPITAL_COMMUNITY)
Admission: RE | Admit: 2022-08-31 | Discharge: 2022-08-31 | Disposition: A | Discharge status: Home / Self Care | Payer: Medicaid Other | Source: Ambulatory Visit | Attending: Nephrology | Admitting: Nephrology

## 2022-08-31 VITALS — BP 148/81 | HR 81 | Temp 97.9°F | Resp 16

## 2022-08-31 DIAGNOSIS — N189 Chronic kidney disease, unspecified: Secondary | ICD-10-CM

## 2022-08-31 MED ORDER — DIPHENHYDRAMINE HCL 25 MG PO CAPS: 25.0000 mg | ORAL_CAPSULE | Freq: Once | ORAL | Status: DC

## 2022-08-31 MED ORDER — SODIUM CHLORIDE 0.9 % IV SOLN
200.0000 mg | Freq: Once | INTRAVENOUS | Status: AC
  Administered 2022-08-31: 200 mg via INTRAVENOUS
  Filled 2022-08-31: qty 200

## 2022-08-31 MED ORDER — ACETAMINOPHEN 325 MG PO TABS: 650.0000 mg | ORAL_TABLET | Freq: Once | ORAL | Status: DC

## 2022-08-31 NOTE — Progress Notes (Signed)
Diagnosis: Anemia in chronic renal failure  Provider:  Donato Heinz MD  Procedure: Infusion  IV Type: Peripheral, IV Location: R Antecubital  Venofer (Iron Sucrose), Dose: 200 mg  Infusion Start Time: 6047  Infusion Stop Time: 9987  Post Infusion IV Care: Patient declined observation and Peripheral IV Discontinued  Discharge: Condition: Stable, Destination: Home . AVS provided to patient.   Performed by:  Binnie Kand, RN

## 2022-09-05 ENCOUNTER — Encounter (HOSPITAL_COMMUNITY)
Admission: RE | Admit: 2022-09-05 | Discharge: 2022-09-05 | Disposition: A | Discharge status: Home / Self Care | Payer: Medicaid Other | Source: Ambulatory Visit | Attending: Nephrology | Admitting: Nephrology

## 2022-09-05 VITALS — BP 136/82 | HR 84 | Temp 98.1°F | Resp 14

## 2022-09-05 DIAGNOSIS — N189 Chronic kidney disease, unspecified: Secondary | ICD-10-CM

## 2022-09-05 DIAGNOSIS — I129 Hypertensive chronic kidney disease with stage 1 through stage 4 chronic kidney disease, or unspecified chronic kidney disease: Secondary | ICD-10-CM | POA: Diagnosis not present

## 2022-09-05 DIAGNOSIS — N184 Chronic kidney disease, stage 4 (severe): Secondary | ICD-10-CM | POA: Diagnosis not present

## 2022-09-05 MED ORDER — SODIUM CHLORIDE 0.9 % IV SOLN
200.0000 mg | Freq: Once | INTRAVENOUS | Status: AC
  Administered 2022-09-05: 200 mg via INTRAVENOUS
  Filled 2022-09-05: qty 200

## 2022-09-05 NOTE — Progress Notes (Signed)
Diagnosis: Anemia in chronic renal failure  Provider:  Donato Heinz MD  Procedure: Infusion  IV Type: Peripheral, IV Location: R Antecubital  Venofer (Iron Sucrose), Dose: 200 mg  Infusion Start Time: 9295  Infusion Stop Time: 1335  Post Infusion IV Care: Patient declined observation and Peripheral IV Discontinued  Discharge: Condition: Stable, Destination: Home . Patient declined AVS.  Performed by:  Binnie Kand, RN

## 2022-09-05 NOTE — Addendum Note (Signed)
Encounter addended by: Baxter Hire, RN on: 09/05/2022 1:57 PM  Actions taken: Therapy plan modified

## 2022-09-07 ENCOUNTER — Encounter (HOSPITAL_COMMUNITY)
Admission: RE | Admit: 2022-09-07 | Discharge: 2022-09-07 | Disposition: A | Discharge status: Home / Self Care | Payer: Medicaid Other | Source: Ambulatory Visit | Attending: Nephrology | Admitting: Nephrology

## 2022-09-07 VITALS — BP 129/87 | HR 86 | Temp 98.0°F | Resp 16

## 2022-09-07 DIAGNOSIS — N189 Chronic kidney disease, unspecified: Secondary | ICD-10-CM | POA: Insufficient documentation

## 2022-09-07 DIAGNOSIS — D631 Anemia in chronic kidney disease: Secondary | ICD-10-CM | POA: Insufficient documentation

## 2022-09-07 MED ORDER — SODIUM CHLORIDE 0.9 % IV SOLN
200.0000 mg | Freq: Once | INTRAVENOUS | Status: AC
  Administered 2022-09-07: 200 mg via INTRAVENOUS
  Filled 2022-09-07: qty 200

## 2022-09-07 NOTE — Progress Notes (Addendum)
Diagnosis: Anemia in chronic renal failure  Provider:  Donato Heinz MD  Procedure: Infusion  IV Type: Peripheral, IV Location: L Antecubital  Venofer (Iron Sucrose), Dose: 200 mg  Infusion Start Time: 1250  Infusion Stop Time: 8177  Post Infusion IV Care: Patient declined observation and Peripheral IV Discontinued  Discharge: Condition: Good, Destination: Home . AVS provided to patient.   Performed by:  Baxter Hire, RN

## 2022-09-27 ENCOUNTER — Encounter (HOSPITAL_COMMUNITY): Payer: Self-pay | Admitting: Nephrology

## 2022-09-29 ENCOUNTER — Encounter (HOSPITAL_COMMUNITY): Payer: Self-pay | Admitting: Nephrology

## 2022-10-11 ENCOUNTER — Encounter (HOSPITAL_COMMUNITY): Payer: Self-pay | Admitting: Nephrology

## 2022-10-27 DIAGNOSIS — D631 Anemia in chronic kidney disease: Secondary | ICD-10-CM | POA: Diagnosis not present

## 2022-10-27 DIAGNOSIS — N059 Unspecified nephritic syndrome with unspecified morphologic changes: Secondary | ICD-10-CM | POA: Diagnosis not present

## 2022-10-27 DIAGNOSIS — N189 Chronic kidney disease, unspecified: Secondary | ICD-10-CM | POA: Diagnosis not present

## 2022-10-27 DIAGNOSIS — I10 Essential (primary) hypertension: Secondary | ICD-10-CM | POA: Diagnosis not present

## 2022-10-27 DIAGNOSIS — N2581 Secondary hyperparathyroidism of renal origin: Secondary | ICD-10-CM | POA: Diagnosis not present

## 2022-10-27 DIAGNOSIS — N1832 Chronic kidney disease, stage 3b: Secondary | ICD-10-CM | POA: Diagnosis not present

## 2023-01-04 DIAGNOSIS — N1831 Chronic kidney disease, stage 3a: Secondary | ICD-10-CM | POA: Diagnosis not present

## 2023-01-04 DIAGNOSIS — E1169 Type 2 diabetes mellitus with other specified complication: Secondary | ICD-10-CM | POA: Diagnosis not present

## 2023-01-04 DIAGNOSIS — I129 Hypertensive chronic kidney disease with stage 1 through stage 4 chronic kidney disease, or unspecified chronic kidney disease: Secondary | ICD-10-CM | POA: Diagnosis not present

## 2023-01-31 DIAGNOSIS — I10 Essential (primary) hypertension: Secondary | ICD-10-CM | POA: Diagnosis not present

## 2023-01-31 DIAGNOSIS — N059 Unspecified nephritic syndrome with unspecified morphologic changes: Secondary | ICD-10-CM | POA: Diagnosis not present

## 2023-01-31 DIAGNOSIS — N1832 Chronic kidney disease, stage 3b: Secondary | ICD-10-CM | POA: Diagnosis not present

## 2023-01-31 DIAGNOSIS — N2581 Secondary hyperparathyroidism of renal origin: Secondary | ICD-10-CM | POA: Diagnosis not present

## 2023-01-31 DIAGNOSIS — D631 Anemia in chronic kidney disease: Secondary | ICD-10-CM | POA: Diagnosis not present

## 2023-01-31 DIAGNOSIS — E1122 Type 2 diabetes mellitus with diabetic chronic kidney disease: Secondary | ICD-10-CM | POA: Diagnosis not present

## 2023-02-20 ENCOUNTER — Other Ambulatory Visit: Payer: Self-pay

## 2023-03-21 DIAGNOSIS — N1832 Chronic kidney disease, stage 3b: Secondary | ICD-10-CM | POA: Diagnosis not present

## 2023-05-04 DIAGNOSIS — N059 Unspecified nephritic syndrome with unspecified morphologic changes: Secondary | ICD-10-CM | POA: Diagnosis not present

## 2023-05-04 DIAGNOSIS — I10 Essential (primary) hypertension: Secondary | ICD-10-CM | POA: Diagnosis not present

## 2023-05-04 DIAGNOSIS — N189 Chronic kidney disease, unspecified: Secondary | ICD-10-CM | POA: Diagnosis not present

## 2023-05-04 DIAGNOSIS — Z6841 Body Mass Index (BMI) 40.0 and over, adult: Secondary | ICD-10-CM | POA: Diagnosis not present

## 2023-05-04 DIAGNOSIS — E1122 Type 2 diabetes mellitus with diabetic chronic kidney disease: Secondary | ICD-10-CM | POA: Diagnosis not present

## 2023-05-04 DIAGNOSIS — D631 Anemia in chronic kidney disease: Secondary | ICD-10-CM | POA: Diagnosis not present

## 2023-05-04 DIAGNOSIS — N1832 Chronic kidney disease, stage 3b: Secondary | ICD-10-CM | POA: Diagnosis not present

## 2023-06-08 DIAGNOSIS — E1169 Type 2 diabetes mellitus with other specified complication: Secondary | ICD-10-CM | POA: Diagnosis not present

## 2023-06-08 DIAGNOSIS — Z0000 Encounter for general adult medical examination without abnormal findings: Secondary | ICD-10-CM | POA: Diagnosis not present

## 2023-06-08 DIAGNOSIS — R82998 Other abnormal findings in urine: Secondary | ICD-10-CM | POA: Diagnosis not present

## 2023-06-08 DIAGNOSIS — D509 Iron deficiency anemia, unspecified: Secondary | ICD-10-CM | POA: Diagnosis not present

## 2023-06-08 DIAGNOSIS — R052 Subacute cough: Secondary | ICD-10-CM | POA: Diagnosis not present

## 2023-06-08 DIAGNOSIS — Z1331 Encounter for screening for depression: Secondary | ICD-10-CM | POA: Diagnosis not present

## 2023-06-08 DIAGNOSIS — I10 Essential (primary) hypertension: Secondary | ICD-10-CM | POA: Diagnosis not present

## 2023-06-08 DIAGNOSIS — N1831 Chronic kidney disease, stage 3a: Secondary | ICD-10-CM | POA: Diagnosis not present

## 2023-08-31 DIAGNOSIS — N1832 Chronic kidney disease, stage 3b: Secondary | ICD-10-CM | POA: Diagnosis not present

## 2023-08-31 DIAGNOSIS — N189 Chronic kidney disease, unspecified: Secondary | ICD-10-CM | POA: Diagnosis not present

## 2023-08-31 DIAGNOSIS — E1122 Type 2 diabetes mellitus with diabetic chronic kidney disease: Secondary | ICD-10-CM | POA: Diagnosis not present

## 2023-08-31 DIAGNOSIS — I10 Essential (primary) hypertension: Secondary | ICD-10-CM | POA: Diagnosis not present

## 2023-08-31 DIAGNOSIS — R809 Proteinuria, unspecified: Secondary | ICD-10-CM | POA: Diagnosis not present

## 2023-08-31 DIAGNOSIS — N059 Unspecified nephritic syndrome with unspecified morphologic changes: Secondary | ICD-10-CM | POA: Diagnosis not present

## 2023-08-31 DIAGNOSIS — D631 Anemia in chronic kidney disease: Secondary | ICD-10-CM | POA: Diagnosis not present

## 2023-08-31 DIAGNOSIS — Z6841 Body Mass Index (BMI) 40.0 and over, adult: Secondary | ICD-10-CM | POA: Diagnosis not present

## 2023-12-06 DIAGNOSIS — E1169 Type 2 diabetes mellitus with other specified complication: Secondary | ICD-10-CM | POA: Diagnosis not present

## 2023-12-06 DIAGNOSIS — Z141 Cystic fibrosis carrier: Secondary | ICD-10-CM | POA: Diagnosis not present

## 2023-12-06 DIAGNOSIS — I10 Essential (primary) hypertension: Secondary | ICD-10-CM | POA: Diagnosis not present

## 2023-12-06 DIAGNOSIS — N059 Unspecified nephritic syndrome with unspecified morphologic changes: Secondary | ICD-10-CM | POA: Diagnosis not present

## 2023-12-06 DIAGNOSIS — R052 Subacute cough: Secondary | ICD-10-CM | POA: Diagnosis not present

## 2023-12-06 DIAGNOSIS — N1831 Chronic kidney disease, stage 3a: Secondary | ICD-10-CM | POA: Diagnosis not present

## 2023-12-06 DIAGNOSIS — D509 Iron deficiency anemia, unspecified: Secondary | ICD-10-CM | POA: Diagnosis not present

## 2023-12-13 DIAGNOSIS — R809 Proteinuria, unspecified: Secondary | ICD-10-CM | POA: Diagnosis not present

## 2023-12-13 DIAGNOSIS — E1122 Type 2 diabetes mellitus with diabetic chronic kidney disease: Secondary | ICD-10-CM | POA: Diagnosis not present

## 2023-12-13 DIAGNOSIS — I10 Essential (primary) hypertension: Secondary | ICD-10-CM | POA: Diagnosis not present

## 2023-12-13 DIAGNOSIS — N1832 Chronic kidney disease, stage 3b: Secondary | ICD-10-CM | POA: Diagnosis not present

## 2023-12-13 DIAGNOSIS — D631 Anemia in chronic kidney disease: Secondary | ICD-10-CM | POA: Diagnosis not present

## 2023-12-13 DIAGNOSIS — Z6841 Body Mass Index (BMI) 40.0 and over, adult: Secondary | ICD-10-CM | POA: Diagnosis not present

## 2023-12-13 DIAGNOSIS — N189 Chronic kidney disease, unspecified: Secondary | ICD-10-CM | POA: Diagnosis not present

## 2023-12-13 DIAGNOSIS — N059 Unspecified nephritic syndrome with unspecified morphologic changes: Secondary | ICD-10-CM | POA: Diagnosis not present

## 2024-01-23 DIAGNOSIS — E1169 Type 2 diabetes mellitus with other specified complication: Secondary | ICD-10-CM | POA: Diagnosis not present

## 2024-01-23 DIAGNOSIS — N059 Unspecified nephritic syndrome with unspecified morphologic changes: Secondary | ICD-10-CM | POA: Diagnosis not present

## 2024-01-23 DIAGNOSIS — I129 Hypertensive chronic kidney disease with stage 1 through stage 4 chronic kidney disease, or unspecified chronic kidney disease: Secondary | ICD-10-CM | POA: Diagnosis not present

## 2024-01-23 DIAGNOSIS — K2900 Acute gastritis without bleeding: Secondary | ICD-10-CM | POA: Diagnosis not present

## 2024-01-23 DIAGNOSIS — D509 Iron deficiency anemia, unspecified: Secondary | ICD-10-CM | POA: Diagnosis not present

## 2024-01-23 DIAGNOSIS — N1831 Chronic kidney disease, stage 3a: Secondary | ICD-10-CM | POA: Diagnosis not present

## 2024-01-24 ENCOUNTER — Encounter: Payer: Self-pay | Admitting: Gastroenterology

## 2024-01-25 DIAGNOSIS — K219 Gastro-esophageal reflux disease without esophagitis: Secondary | ICD-10-CM | POA: Diagnosis not present

## 2024-01-29 ENCOUNTER — Other Ambulatory Visit: Payer: Self-pay | Admitting: Internal Medicine

## 2024-01-29 DIAGNOSIS — R109 Unspecified abdominal pain: Secondary | ICD-10-CM

## 2024-01-29 DIAGNOSIS — Z1211 Encounter for screening for malignant neoplasm of colon: Secondary | ICD-10-CM | POA: Diagnosis not present

## 2024-01-29 DIAGNOSIS — R1010 Upper abdominal pain, unspecified: Secondary | ICD-10-CM | POA: Diagnosis not present

## 2024-01-29 DIAGNOSIS — K219 Gastro-esophageal reflux disease without esophagitis: Secondary | ICD-10-CM | POA: Diagnosis not present

## 2024-02-05 ENCOUNTER — Ambulatory Visit
Admission: RE | Admit: 2024-02-05 | Discharge: 2024-02-05 | Disposition: A | Discharge status: Home / Self Care | Source: Ambulatory Visit | Attending: Internal Medicine

## 2024-02-05 DIAGNOSIS — R161 Splenomegaly, not elsewhere classified: Secondary | ICD-10-CM | POA: Diagnosis not present

## 2024-02-05 DIAGNOSIS — R109 Unspecified abdominal pain: Secondary | ICD-10-CM

## 2024-02-05 DIAGNOSIS — K8020 Calculus of gallbladder without cholecystitis without obstruction: Secondary | ICD-10-CM | POA: Diagnosis not present

## 2024-03-13 DIAGNOSIS — R1010 Upper abdominal pain, unspecified: Secondary | ICD-10-CM | POA: Diagnosis not present

## 2024-03-18 ENCOUNTER — Ambulatory Visit: Admitting: Gastroenterology

## 2024-04-01 DIAGNOSIS — R1010 Upper abdominal pain, unspecified: Secondary | ICD-10-CM | POA: Diagnosis not present

## 2024-04-01 DIAGNOSIS — R141 Gas pain: Secondary | ICD-10-CM | POA: Diagnosis not present

## 2024-04-01 DIAGNOSIS — R140 Abdominal distension (gaseous): Secondary | ICD-10-CM | POA: Diagnosis not present

## 2024-06-03 DIAGNOSIS — Z0189 Encounter for other specified special examinations: Secondary | ICD-10-CM | POA: Diagnosis not present

## 2024-06-03 DIAGNOSIS — D509 Iron deficiency anemia, unspecified: Secondary | ICD-10-CM | POA: Diagnosis not present

## 2024-06-10 DIAGNOSIS — I10 Essential (primary) hypertension: Secondary | ICD-10-CM | POA: Diagnosis not present

## 2024-06-10 DIAGNOSIS — R82998 Other abnormal findings in urine: Secondary | ICD-10-CM | POA: Diagnosis not present

## 2024-06-13 DIAGNOSIS — R112 Nausea with vomiting, unspecified: Secondary | ICD-10-CM | POA: Diagnosis not present

## 2024-06-13 DIAGNOSIS — R140 Abdominal distension (gaseous): Secondary | ICD-10-CM | POA: Diagnosis not present

## 2024-06-13 DIAGNOSIS — K638219 Small intestinal bacterial overgrowth, unspecified: Secondary | ICD-10-CM | POA: Diagnosis not present

## 2024-06-13 DIAGNOSIS — R142 Eructation: Secondary | ICD-10-CM | POA: Diagnosis not present

## 2024-06-13 DIAGNOSIS — R1084 Generalized abdominal pain: Secondary | ICD-10-CM | POA: Diagnosis not present

## 2024-06-13 DIAGNOSIS — K5900 Constipation, unspecified: Secondary | ICD-10-CM | POA: Diagnosis not present

## 2024-06-13 DIAGNOSIS — R197 Diarrhea, unspecified: Secondary | ICD-10-CM | POA: Diagnosis not present

## 2024-06-19 DIAGNOSIS — N1832 Chronic kidney disease, stage 3b: Secondary | ICD-10-CM | POA: Diagnosis not present

## 2024-06-19 DIAGNOSIS — R809 Proteinuria, unspecified: Secondary | ICD-10-CM | POA: Diagnosis not present

## 2024-06-19 DIAGNOSIS — N182 Chronic kidney disease, stage 2 (mild): Secondary | ICD-10-CM | POA: Diagnosis not present

## 2024-06-19 DIAGNOSIS — I10 Essential (primary) hypertension: Secondary | ICD-10-CM | POA: Diagnosis not present

## 2024-06-19 DIAGNOSIS — N059 Unspecified nephritic syndrome with unspecified morphologic changes: Secondary | ICD-10-CM | POA: Diagnosis not present

## 2024-06-19 DIAGNOSIS — E1122 Type 2 diabetes mellitus with diabetic chronic kidney disease: Secondary | ICD-10-CM | POA: Diagnosis not present

## 2024-06-19 DIAGNOSIS — Z6841 Body Mass Index (BMI) 40.0 and over, adult: Secondary | ICD-10-CM | POA: Diagnosis not present

## 2024-06-19 DIAGNOSIS — N390 Urinary tract infection, site not specified: Secondary | ICD-10-CM | POA: Diagnosis not present

## 2024-06-19 DIAGNOSIS — D631 Anemia in chronic kidney disease: Secondary | ICD-10-CM | POA: Diagnosis not present

## 2024-07-17 NOTE — Progress Notes (Signed)
 [name]                                          MRN: [id number]   07/17/2024   The [facility] Quality Team Specialist reviewed this patient medical record for the purposes of chart review for care gap closure. The following were reviewed: chart review for care gap closure-diabetic eye exam.    [facility] Quality Team
